# Patient Record
Sex: Female | Born: 1975 | Race: White | Hispanic: No | Marital: Married | Smoking: Never smoker
Health system: Southern US, Community
[De-identification: ages and names within clinical notes are randomized; demographics above are authoritative.]

## PROBLEM LIST (undated history)

## (undated) DIAGNOSIS — G43919 Migraine, unspecified, intractable, without status migrainosus: Secondary | ICD-10-CM

## (undated) DIAGNOSIS — R519 Headache, unspecified: Secondary | ICD-10-CM

## (undated) DIAGNOSIS — C50919 Malignant neoplasm of unspecified site of unspecified female breast: Secondary | ICD-10-CM

## (undated) DIAGNOSIS — F329 Major depressive disorder, single episode, unspecified: Secondary | ICD-10-CM

## (undated) DIAGNOSIS — R569 Unspecified convulsions: Secondary | ICD-10-CM

## (undated) DIAGNOSIS — G43909 Migraine, unspecified, not intractable, without status migrainosus: Secondary | ICD-10-CM

## (undated) DIAGNOSIS — R51 Headache: Secondary | ICD-10-CM

## (undated) DIAGNOSIS — F32A Depression, unspecified: Secondary | ICD-10-CM

## (undated) DIAGNOSIS — N912 Amenorrhea, unspecified: Secondary | ICD-10-CM

## (undated) DIAGNOSIS — F419 Anxiety disorder, unspecified: Secondary | ICD-10-CM

## (undated) HISTORY — DX: Headache, unspecified: R51.9

## (undated) HISTORY — DX: Migraine, unspecified, not intractable, without status migrainosus: G43.909

## (undated) HISTORY — DX: Migraine, unspecified, intractable, without status migrainosus: G43.919

## (undated) HISTORY — DX: Depression, unspecified: F32.A

## (undated) HISTORY — PX: LAPAROSCOPY: SHX197

## (undated) HISTORY — PX: TUBAL LIGATION: SHX77

## (undated) HISTORY — DX: Headache: R51

## (undated) HISTORY — DX: Major depressive disorder, single episode, unspecified: F32.9

## (undated) HISTORY — PX: OTHER SURGICAL HISTORY: SHX169

## (undated) HISTORY — DX: Anxiety disorder, unspecified: F41.9

## (undated) HISTORY — DX: Unspecified convulsions: R56.9

## (undated) HISTORY — DX: Amenorrhea, unspecified: N91.2

## (undated) HISTORY — PX: DILATION AND CURETTAGE OF UTERUS: SHX78

---

## 2001-11-15 ENCOUNTER — Other Ambulatory Visit: Admission: RE | Admit: 2001-11-15 | Discharge: 2001-11-15 | Payer: Self-pay | Admitting: Obstetrics and Gynecology

## 2001-11-18 ENCOUNTER — Encounter: Payer: Self-pay | Admitting: Obstetrics and Gynecology

## 2001-11-18 ENCOUNTER — Encounter: Admission: RE | Admit: 2001-11-18 | Discharge: 2001-11-18 | Payer: Self-pay | Admitting: Obstetrics and Gynecology

## 2002-01-04 ENCOUNTER — Encounter: Admission: RE | Admit: 2002-01-04 | Discharge: 2002-01-04 | Payer: Self-pay | Admitting: Obstetrics and Gynecology

## 2002-01-04 ENCOUNTER — Encounter: Payer: Self-pay | Admitting: Obstetrics and Gynecology

## 2002-03-29 ENCOUNTER — Encounter: Payer: Self-pay | Admitting: Obstetrics and Gynecology

## 2002-03-29 ENCOUNTER — Encounter: Admission: RE | Admit: 2002-03-29 | Discharge: 2002-03-29 | Payer: Self-pay | Admitting: Obstetrics and Gynecology

## 2002-11-24 ENCOUNTER — Other Ambulatory Visit: Admission: RE | Admit: 2002-11-24 | Discharge: 2002-11-24 | Payer: Self-pay | Admitting: Obstetrics and Gynecology

## 2003-08-19 ENCOUNTER — Emergency Department (HOSPITAL_COMMUNITY): Admission: EM | Admit: 2003-08-19 | Discharge: 2003-08-19 | Payer: Self-pay | Admitting: Emergency Medicine

## 2003-12-19 ENCOUNTER — Other Ambulatory Visit: Admission: RE | Admit: 2003-12-19 | Discharge: 2003-12-19 | Payer: Self-pay | Admitting: Obstetrics and Gynecology

## 2005-01-09 ENCOUNTER — Other Ambulatory Visit: Admission: RE | Admit: 2005-01-09 | Discharge: 2005-01-09 | Payer: Self-pay | Admitting: Obstetrics and Gynecology

## 2005-12-22 ENCOUNTER — Ambulatory Visit (HOSPITAL_COMMUNITY): Admission: RE | Admit: 2005-12-22 | Discharge: 2005-12-22 | Payer: Self-pay | Admitting: Obstetrics & Gynecology

## 2005-12-22 ENCOUNTER — Encounter (INDEPENDENT_AMBULATORY_CARE_PROVIDER_SITE_OTHER): Payer: Self-pay | Admitting: *Deleted

## 2010-10-11 NOTE — Op Note (Signed)
NAME:  Julie Bennett, Julie Bennett             ACCOUNT NO.:  [id number]   MEDICAL RECORD NO.:  [id number]          PATIENT TYPE:  AMB   LOCATION:  SDC                           FACILITY:  [facility]   PHYSICIAN:  Gerrit Friends. Aldona Bar, M.D.   DATE OF BIRTH:  1975-08-26   DATE OF PROCEDURE:  12/22/2005  DATE OF DISCHARGE:                                 OPERATIVE REPORT   The patient is age 35.   PREOPERATIVE DIAGNOSES:  First trimester pregnancy loss, pathology pending.  Blood type O+.   POSTOPERATIVE DIAGNOSES:  First trimester pregnancy loss, pathology pending.  Blood type O+.   PROCEDURE:  Suction dilatation and curettage for evacuation of first  trimester pregnancy loss.   SURGEON:  Gerrit Friends. Aldona Bar, M.D.   ANESTHESIA:  Intravenous conscious sedation and paracervical block with 1%  Xylocaine without epinephrine.   HISTORY:  This gravida 4, para 3 was seen in the office in early July and at  that time had an ultrasound consistent with her dates making her then  approximately 7 weeks and 5 days pregnant.  She returned to the office on  July 26 with an episode of sharp right lower quadrant pain and some nausea  and vomiting preceded a week earlier by an upper respiratory infection. When  she was examined in the office, her uterus felt at best to be 8 week size  inconsistent with her previous exam and ultrasound and indeed on ultrasound  a first trimester fetal demise at about [redacted] weeks gestation was diagnosed.  The patient had a CBC, differential and a comprehensive metabolic profile to  further evaluate her right lower quadrant discomfort for which no real  etiology could be determined.  She is now taken to the operating room for  evacuation of her first trimester pregnancy loss.  Her right lower quadrant  pain is now intermittent in nature, but as of yet she has had no bleeding.   OPERATIVE PROCEDURE:  The patient was taken to the operating room where  after the satisfactory induction of intravenous  conscious sedation, she was  prepped and draped having been placed in the modified lithotomy position.  A  speculum was placed after a prep was carried out and the bladder was drained  of clear urine with a rubber catheter in an in-and-out fashion.  Once the  cervix was adequately visualized, a paracervical block was carried out using  approximately 18 mL of 1% Xylocaine without epinephrine.  Thereafter the  internal os was systematically dilated to a #29 Pratt dilator and thereafter  using a #9 suction curette the cavity was thoroughly gently and  systematically evacuated of all products of conception.  Curettage with a  small standard curette was then carried out with no further production of  tissue and resuctioning likewise produced no further tissue.  The  examination at this time found the uterus was upper limits of normal size  and was firm and bleeding was minimal.  The procedure was felt to be  complete and the patient at this time was awakened and transported to the  recovery room in satisfactory  addition.  Pathologic specimen consistent with  products of conception.   The patient will be discharged from the recovery room appropriately and seen  in the office for follow-up in approximately two weeks' time.  Discharge  medications will include Naprosyn 500 mg every 6-8 hours as needed  for cramping and doxycycline 100 mg twice a day for a total of 5 days to  avoid any potential infection.  The patient will be given an instruction  sheet at the time of discharge from recovery. Condition on arrival to  recovery satisfactory.      Gerrit Friends. Aldona Bar, M.D.  Electronically Signed     RMW/MEDQ  D:  12/22/2005  T:  12/23/2005  Job:  [id number]

## 2013-04-14 ENCOUNTER — Emergency Department (HOSPITAL_COMMUNITY)
Admission: EM | Admit: 2013-04-14 | Discharge: 2013-04-14 | Disposition: A | Discharge status: Home / Self Care | Payer: No Typology Code available for payment source | Attending: Emergency Medicine | Admitting: Emergency Medicine

## 2013-04-14 ENCOUNTER — Encounter (HOSPITAL_COMMUNITY): Payer: Self-pay | Admitting: Emergency Medicine

## 2013-04-14 DIAGNOSIS — M549 Dorsalgia, unspecified: Secondary | ICD-10-CM

## 2013-04-14 DIAGNOSIS — Z79899 Other long term (current) drug therapy: Secondary | ICD-10-CM | POA: Insufficient documentation

## 2013-04-14 DIAGNOSIS — Y9389 Activity, other specified: Secondary | ICD-10-CM | POA: Insufficient documentation

## 2013-04-14 DIAGNOSIS — Y92410 Unspecified street and highway as the place of occurrence of the external cause: Secondary | ICD-10-CM | POA: Insufficient documentation

## 2013-04-14 DIAGNOSIS — IMO0002 Reserved for concepts with insufficient information to code with codable children: Secondary | ICD-10-CM | POA: Insufficient documentation

## 2013-04-14 MED ORDER — METHOCARBAMOL 500 MG PO TABS: 1000.0000 mg | ORAL_TABLET | Freq: Four times a day (QID) | ORAL | Status: DC

## 2013-04-14 MED ORDER — NAPROXEN 500 MG PO TABS: 500.0000 mg | ORAL_TABLET | Freq: Two times a day (BID) | ORAL | Status: DC
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
## 2013-04-14 NOTE — ED Provider Notes (Signed)
Medical screening examination/treatment/procedure(s) were conducted as a shared visit with non-physician practitioner(s) and myself.  I personally evaluated the patient during the encounter.  EKG Interpretation   None       I saw and evaluated patient, she is awake, alert, appears comfortable, moving all extremities.  No indication for imaging at this time.    Ethelda Chick, MD 04/14/13 1057
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
## 2013-08-21 ENCOUNTER — Encounter (HOSPITAL_COMMUNITY): Payer: Self-pay | Admitting: Emergency Medicine

## 2013-08-21 ENCOUNTER — Emergency Department (HOSPITAL_COMMUNITY)
Admission: EM | Admit: 2013-08-21 | Discharge: 2013-08-21 | Disposition: A | Discharge status: Home / Self Care | Payer: No Typology Code available for payment source | Attending: Emergency Medicine | Admitting: Emergency Medicine

## 2013-08-21 DIAGNOSIS — N390 Urinary tract infection, site not specified: Secondary | ICD-10-CM | POA: Insufficient documentation

## 2013-08-21 DIAGNOSIS — R1084 Generalized abdominal pain: Secondary | ICD-10-CM | POA: Insufficient documentation

## 2013-08-21 DIAGNOSIS — R319 Hematuria, unspecified: Secondary | ICD-10-CM | POA: Insufficient documentation

## 2013-08-21 DIAGNOSIS — K5900 Constipation, unspecified: Secondary | ICD-10-CM | POA: Insufficient documentation

## 2013-08-21 DIAGNOSIS — R300 Dysuria: Secondary | ICD-10-CM | POA: Insufficient documentation

## 2013-08-21 LAB — CBC WITH DIFFERENTIAL/PLATELET
BASOS PCT: 0 % (ref 0–1)
Basophils Absolute: 0 10*3/uL (ref 0.0–0.1)
EOS ABS: 0.1 10*3/uL (ref 0.0–0.7)
Eosinophils Relative: 1 % (ref 0–5)
HCT: 37 % (ref 36.0–46.0)
Hemoglobin: 12.9 g/dL (ref 12.0–15.0)
Lymphocytes Relative: 15 % (ref 12–46)
Lymphs Abs: 2.3 10*3/uL (ref 0.7–4.0)
MCH: 31.6 pg (ref 26.0–34.0)
MCHC: 34.9 g/dL (ref 30.0–36.0)
MCV: 90.7 fL (ref 78.0–100.0)
Monocytes Absolute: 0.9 10*3/uL (ref 0.1–1.0)
Monocytes Relative: 6 % (ref 3–12)
NEUTROS PCT: 79 % — AB (ref 43–77)
Neutro Abs: 12.4 10*3/uL — ABNORMAL HIGH (ref 1.7–7.7)
Platelets: 294 10*3/uL (ref 150–400)
RBC: 4.08 MIL/uL (ref 3.87–5.11)
RDW: 12.8 % (ref 11.5–15.5)
WBC: 15.7 10*3/uL — ABNORMAL HIGH (ref 4.0–10.5)

## 2013-08-21 LAB — WET PREP, GENITAL
Clue Cells Wet Prep HPF POC: NONE SEEN
TRICH WET PREP: NONE SEEN
WBC WET PREP: NONE SEEN
Yeast Wet Prep HPF POC: NONE SEEN

## 2013-08-21 LAB — COMPREHENSIVE METABOLIC PANEL
ALK PHOS: 90 U/L (ref 39–117)
ALT: 11 U/L (ref 0–35)
AST: 18 U/L (ref 0–37)
Albumin: 3.9 g/dL (ref 3.5–5.2)
BUN: 10 mg/dL (ref 6–23)
CO2: 26 meq/L (ref 19–32)
Calcium: 9.2 mg/dL (ref 8.4–10.5)
Chloride: 104 mEq/L (ref 96–112)
Creatinine, Ser: 0.8 mg/dL (ref 0.50–1.10)
GFR calc Af Amer: >90 mL/min (ref >90)
GLUCOSE: 79 mg/dL (ref 70–99)
POTASSIUM: 4.3 meq/L (ref 3.7–5.3)
SODIUM: 142 meq/L (ref 137–147)
Total Bilirubin: 0.2 mg/dL — ABNORMAL LOW (ref 0.3–1.2)
Total Protein: 7.2 g/dL (ref 6.0–8.3)

## 2013-08-21 LAB — URINALYSIS, ROUTINE W REFLEX MICROSCOPIC
BILIRUBIN URINE: NEGATIVE
GLUCOSE, UA: NEGATIVE mg/dL
Ketones, ur: 15 mg/dL — AB
Nitrite: NEGATIVE
PH: 5.5 (ref 5.0–8.0)
Protein, ur: 30 mg/dL — AB
SPECIFIC GRAVITY, URINE: 1.01 (ref 1.005–1.030)
Urobilinogen, UA: 1 mg/dL (ref 0.0–1.0)

## 2013-08-21 LAB — URINE MICROSCOPIC-ADD ON

## 2013-08-21 LAB — POC URINE PREG, ED: PREG TEST UR: NEGATIVE

## 2013-08-21 MED ORDER — PHENAZOPYRIDINE HCL 200 MG PO TABS: 200.0000 mg | ORAL_TABLET | Freq: Three times a day (TID) | ORAL | Status: DC

## 2013-08-21 MED ORDER — SULFAMETHOXAZOLE-TRIMETHOPRIM 800-160 MG PO TABS: 1.0000 | ORAL_TABLET | Freq: Two times a day (BID) | ORAL | Status: AC

## 2013-08-21 MED ORDER — KETOROLAC TROMETHAMINE 60 MG/2ML IM SOLN
60.0000 mg | Freq: Once | INTRAMUSCULAR | Status: AC
  Administered 2013-08-21: 60 mg via INTRAMUSCULAR
  Filled 2013-08-21: qty 2

## 2013-08-21 NOTE — ED Provider Notes (Signed)
I saw and evaluated the patient, reviewed the resident's note and I agree with the findings and plan.   .Face to face Exam:  General:  Awake HEENT:  Atraumatic Resp:  Normal effort Abd:  Nondistended Neuro:No focal weakness  Nelia Shiobert L Loistine Eberlin, MD 08/21/13 1819

## 2013-08-21 NOTE — ED Provider Notes (Signed)
CSN: [id number]     Arrival date & time 08/21/13  1526 History   First MD Initiated Contact with Patient 08/21/13 1604     Chief Complaint  Patient presents with  . Abdominal Pain  . Hematuria     (Consider location/radiation/quality/duration/timing/severity/associated sxs/prior Treatment) Patient is a 38 y.o. female presenting with abdominal pain. The history is provided by the patient.  Abdominal Pain Pain location:  Suprapubic Pain quality: cramping   Pain radiates to:  Does not radiate Pain severity:  Mild Onset quality:  Gradual Duration:  1 day Timing:  Constant Progression:  Unchanged Chronicity:  New Context: not alcohol use, not eating, not previous surgeries, not recent illness and not trauma   Relieved by:  Nothing Worsened by:  Urination Ineffective treatments:  None tried Associated symptoms: constipation, dysuria and hematuria   Associated symptoms: no anorexia, no chest pain, no cough, no diarrhea, no hematemesis, no hematochezia, no nausea, no shortness of breath and no vomiting   Risk factors: no alcohol abuse, not elderly, has not had multiple surgeries and not obese     History reviewed. No pertinent past medical history. Past Surgical History  Procedure Laterality Date  . Tubal ligation     History reviewed. No pertinent family history. History  Substance Use Topics  . Smoking status: Not on file  . Smokeless tobacco: Not on file  . Alcohol Use: No   OB History   Grav Para Term Preterm Abortions TAB SAB Ect Mult Living                 Review of Systems  Constitutional: Negative for activity change and appetite change.  HENT: Negative for congestion and rhinorrhea.   Eyes: Negative for discharge, redness and itching.  Respiratory: Negative for cough, shortness of breath, wheezing and stridor.   Cardiovascular: Negative for chest pain.  Gastrointestinal: Positive for abdominal pain and constipation. Negative for nausea, vomiting, diarrhea,  hematochezia, anorexia and hematemesis.  Genitourinary: Positive for dysuria and hematuria. Negative for decreased urine volume.  Skin: Negative for rash and wound.  Neurological: Negative for syncope.      Allergies  Review of patient's allergies indicates no known allergies.  Home Medications   Current Outpatient Rx  Name  Route  Sig  Dispense  Refill  . phenazopyridine (PYRIDIUM) 200 MG tablet   Oral   Take 1 tablet (200 mg total) by mouth 3 (three) times daily.   6 tablet   0   . sulfamethoxazole-trimethoprim (SEPTRA DS) 800-160 MG per tablet   Oral   Take 1 tablet by mouth 2 (two) times daily.   14 tablet   0    BP 129/73  Temp(Src) 97.8 F (36.6 C) (Oral)  Resp 16  Ht 5\' 5"  (1.651 m)  Wt 140 lb (63.504 kg)  BMI 23.30 kg/m2  SpO2 100%  LMP 08/14/2013 Physical Exam  Constitutional: She is oriented to person, place, and time. She appears well-developed and well-nourished. No distress.  HENT:  Head: Normocephalic and atraumatic.  Mouth/Throat: Oropharynx is clear and moist.  Eyes: Conjunctivae and EOM are normal. Pupils are equal, round, and reactive to light. Right eye exhibits no discharge. Left eye exhibits no discharge. No scleral icterus.  Neck: Normal range of motion. Neck supple.  Cardiovascular: Normal rate, regular rhythm and intact distal pulses.  Exam reveals no gallop and no friction rub.   No murmur heard. Pulmonary/Chest: Effort normal and breath sounds normal. No respiratory distress. She has no wheezes.  She has no rales.  Abdominal: Soft. She exhibits no distension and no mass. There is tenderness (suprapubic ttp, no RLQ, LLQ , RUQ, LUQ, or CVA TTP, no rigidity, no peritoneal signs).  Genitourinary: Vagina normal and uterus normal.  No gross blood, physiological discharge, no CMT, no adnexal ttp or fullness  Musculoskeletal: Normal range of motion.  Neurological: She is alert and oriented to person, place, and time. No cranial nerve deficit. She  exhibits normal muscle tone. Coordination normal.  Skin: She is not diaphoretic.    ED Course  Procedures (including critical care time) Labs Review Labs Reviewed  URINALYSIS, ROUTINE W REFLEX MICROSCOPIC - Abnormal; Notable for the following:    Color, Urine RED (*)    APPearance TURBID (*)    Hgb urine dipstick LARGE (*)    Ketones, ur 15 (*)    Protein, ur 30 (*)    Leukocytes, UA MODERATE (*)    All other components within normal limits  COMPREHENSIVE METABOLIC PANEL - Abnormal; Notable for the following:    Total Bilirubin 0.2 (*)    All other components within normal limits  CBC WITH DIFFERENTIAL - Abnormal; Notable for the following:    WBC 15.7 (*)    Neutrophils Relative % 79 (*)    Neutro Abs 12.4 (*)    All other components within normal limits  URINE MICROSCOPIC-ADD ON - Abnormal; Notable for the following:    Bacteria, UA FEW (*)    All other components within normal limits  WET PREP, GENITAL  GC/CHLAMYDIA PROBE AMP  URINE CULTURE  POC URINE PREG, ED   Imaging Review No results found.   EKG Interpretation None      MDM   MDM: 38 y.o. WF w/ no PMHX w. Cc: of suprapubic abd pain x 1 day. Started today, states that she has been urinating some gross blood. Pain is all anterior suprapubic, having dysuria as well. No f/c, n/v/d, chest pain, SOB. No hx of prior. States pain feels crampy. AFVSS, well appearing here, playing on ipad. Mild anterior suprapubic abd ttp, no rigidity or periotnitis. No RLQ, neg mcburneys, neg obturator, neg psoas, no CVA ttp. GU exam with no blood, no CMT, tolerated well. Labs with leukocytosis, urine with blood, leuks, bacteria. Likely has cystitis. No CVA ttp, no flank ttp to suggest pyelo or stone. No signs of appy as neg mcburneys, afebrile, presence of dysuria, reassuring abd exam. Given toradol here, will treat with BActrim and Pyridium. Pt with no PCP states "she doesn't need to see a doctor" recommend follow up with ED if sh edevelops  vomiting, fever, change in pain severity or location, or in 3 days if pain not resolved or greatly improved. Discharged. Care of case d/w my attending.  Final diagnoses:  Urinary tract infection    Discharged  Pilar Jarvis, MD 08/21/13 1812  Pilar Jarvis, MD 08/21/13 [phone]

## 2013-08-21 NOTE — ED Notes (Signed)
Pt reports lower abd pain x 2 hours. Reports frequent urination and bowel movements then started having vaginal bleeding and blood in urine, passing blood clots. No acute distress noted at triage.

## 2013-08-21 NOTE — ED Notes (Signed)
Pt discharged home. Has no further questions. Vital signs stable. No signs of distress noted.

## 2013-08-21 NOTE — Discharge Instructions (Signed)
Urinary Tract Infection  Urinary tract infections (UTIs) can develop anywhere along your urinary tract. Your urinary tract is your body's drainage system for removing wastes and extra water. Your urinary tract includes two kidneys, two ureters, a bladder, and a urethra. Your kidneys are a pair of bean-shaped organs. Each kidney is about the size of your fist. They are located below your ribs, one on each side of your spine.  CAUSES  Infections are caused by microbes, which are microscopic organisms, including fungi, viruses, and bacteria. These organisms are so small that they can only be seen through a microscope. Bacteria are the microbes that most commonly cause UTIs.  SYMPTOMS   Symptoms of UTIs may vary by age and gender of the patient and by the location of the infection. Symptoms in young women typically include a frequent and intense urge to urinate and a painful, burning feeling in the bladder or urethra during urination. Older women and men are more likely to be tired, shaky, and weak and have muscle aches and abdominal pain. A fever may mean the infection is in your kidneys. Other symptoms of a kidney infection include pain in your back or sides below the ribs, nausea, and vomiting.  DIAGNOSIS  To diagnose a UTI, your caregiver will ask you about your symptoms. Your caregiver also will ask to provide a urine sample. The urine sample will be tested for bacteria and white blood cells. White blood cells are made by your body to help fight infection.  TREATMENT   Typically, UTIs can be treated with medication. Because most UTIs are caused by a bacterial infection, they usually can be treated with the use of antibiotics. The choice of antibiotic and length of treatment depend on your symptoms and the type of bacteria causing your infection.  HOME CARE INSTRUCTIONS   If you were prescribed antibiotics, take them exactly as your caregiver instructs you. Finish the medication even if you feel better after you  have only taken some of the medication.   Drink enough water and fluids to keep your urine clear or pale yellow.   Avoid caffeine, tea, and carbonated beverages. They tend to irritate your bladder.   Empty your bladder often. Avoid holding urine for long periods of time.   Empty your bladder before and after sexual intercourse.   After a bowel movement, women should cleanse from front to back. Use each tissue only once.  SEEK MEDICAL CARE IF:    You have back pain.   You develop a fever.   Your symptoms do not begin to resolve within 3 days.  SEEK IMMEDIATE MEDICAL CARE IF:    You have severe back pain or lower abdominal pain.   You develop chills.   You have nausea or vomiting.   You have continued burning or discomfort with urination.  MAKE SURE YOU:    Understand these instructions.   Will watch your condition.   Will get help right away if you are not doing well or get worse.  Document Released: 02/19/2005 Document Revised: 11/11/2011 Document Reviewed: 06/20/2011  [employer] Patient Information 2014 [employer]

## 2013-08-22 LAB — GC/CHLAMYDIA PROBE AMP
CT Probe RNA: NEGATIVE
GC Probe RNA: NEGATIVE

## 2013-08-24 LAB — URINE CULTURE: Colony Count: >=100000

## 2013-08-25 NOTE — Progress Notes (Signed)
ED Antimicrobial Stewardship Positive Culture Follow Up   Julie Bennett is an 38 y.o. female who [facility] with a chief complaint of  Chief Complaint  Patient presents with  . Abdominal Pain  . Hematuria    Recent Results (from the past 720 hour(s))  URINE CULTURE     Status: None   Collection Time    08/21/13  3:44 PM      Result Value Ref Range Status   Specimen Description URINE, CLEAN CATCH   Final   Special Requests NONE   Final   Culture  Setup Time     Final   Value: 08/22/2013 10:04     Performed at [facility]   Colony Count     Final   Value: >=100,000 COLONIES/ML     Performed at [facility]   Culture     Final   Value: ESCHERICHIA COLI     Performed at [facility]   Report Status 08/24/2013 FINAL   Final   Organism ID, Bacteria ESCHERICHIA COLI   Final  GC/CHLAMYDIA PROBE AMP     Status: None   Collection Time    08/21/13  5:17 PM      Result Value Ref Range Status   CT Probe RNA NEGATIVE  NEGATIVE Final   GC Probe RNA NEGATIVE  NEGATIVE Final   Comment: (NOTE)                                                                                               **Normal Reference Range: Negative**          Assay performed using the Gen-Probe APTIMA COMBO2 (R) Assay.     Acceptable specimen types for this assay include APTIMA Swabs (Unisex,     endocervical, urethral, or vaginal), first void urine, and ThinPrep     liquid based cytology samples.     Performed at [facility]  WET PREP, GENITAL     Status: None   Collection Time    08/21/13  5:17 PM      Result Value Ref Range Status   Yeast Wet Prep HPF POC NONE SEEN  NONE SEEN Final   Trich, Wet Prep NONE SEEN  NONE SEEN Final   Clue Cells Wet Prep HPF POC NONE SEEN  NONE SEEN Final   WBC, Wet Prep HPF POC NONE SEEN  NONE SEEN Final    [x]  Treated with Septra, organism resistant to prescribed antimicrobial Pt came in with abd pain and hematuria.  Sent out on septra but e.coli is resistant to it. Will use Keflex instead.   New antibiotic prescription:   Dc Septra Start Keflex 500mg  PO x 7 days  ED Provider: Trixie DredgeEmily West, PA   Ulyses SouthwardMinh Pham, PharmD Pager: [phone] Infectious Diseases Pharmacist Phone# [phone]

## 2013-08-25 NOTE — ED Notes (Signed)
+   urine D/C Septra Start Keflex 500 mg po TID x 7 days needs to be called to pharmacy.

## 2013-08-26 ENCOUNTER — Telehealth (HOSPITAL_BASED_OUTPATIENT_CLINIC_OR_DEPARTMENT_OTHER): Payer: Self-pay | Admitting: *Deleted

## 2014-03-18 DIAGNOSIS — N83209 Unspecified ovarian cyst, unspecified side: Secondary | ICD-10-CM | POA: Insufficient documentation

## 2016-05-21 DIAGNOSIS — N912 Amenorrhea, unspecified: Secondary | ICD-10-CM | POA: Insufficient documentation

## 2016-05-21 DIAGNOSIS — B9689 Other specified bacterial agents as the cause of diseases classified elsewhere: Secondary | ICD-10-CM | POA: Insufficient documentation

## 2016-05-23 DIAGNOSIS — E28319 Asymptomatic premature menopause: Secondary | ICD-10-CM | POA: Insufficient documentation

## 2016-08-20 ENCOUNTER — Encounter: Payer: Self-pay | Admitting: Neurology

## 2016-08-20 ENCOUNTER — Telehealth: Payer: Self-pay

## 2016-08-20 ENCOUNTER — Encounter (INDEPENDENT_AMBULATORY_CARE_PROVIDER_SITE_OTHER): Payer: Self-pay

## 2016-08-20 ENCOUNTER — Other Ambulatory Visit: Payer: Self-pay | Admitting: Neurology

## 2016-08-20 ENCOUNTER — Ambulatory Visit (INDEPENDENT_AMBULATORY_CARE_PROVIDER_SITE_OTHER): Payer: Medicaid Other | Admitting: Neurology

## 2016-08-20 DIAGNOSIS — G43919 Migraine, unspecified, intractable, without status migrainosus: Secondary | ICD-10-CM | POA: Diagnosis not present

## 2016-08-20 DIAGNOSIS — G43109 Migraine with aura, not intractable, without status migrainosus: Secondary | ICD-10-CM

## 2016-08-20 HISTORY — DX: Migraine, unspecified, intractable, without status migrainosus: G43.919

## 2016-08-20 MED ORDER — RIZATRIPTAN BENZOATE 10 MG PO TBDP: 10.0000 mg | ORAL_TABLET | ORAL | 1 refills | Status: DC | PRN

## 2016-08-20 MED ORDER — TOPIRAMATE 25 MG PO TABS: 25.0000 mg | ORAL_TABLET | Freq: Two times a day (BID) | ORAL | 3 refills | Status: DC

## 2016-08-20 MED ORDER — TOPIRAMATE ER 50 MG PO CAP24: 1.0000 | ORAL_CAPSULE | Freq: Every day | ORAL | 3 refills | Status: DC

## 2016-08-20 NOTE — Progress Notes (Signed)
[facility] [address] [place]. [place] [address] [phone]       OFFICE CONSULT NOTE  Julie. Lanney Gins Date of Birth:  [birth year] Medical Record Number:  [id number]   Referring MD:  Virl Son Reason for Referral:  headaches  HPI: Julie Bennett is a pleasant 41 year Caucasian lady who has had migraine headaches since age 41. She's noticed increasing frequency and severity of her headaches since June 2017. There is happened after she moved back to [place] from [place]. She describes the headache as stated typical starting in the temporalis it may be pressure-like at the onset but soon becomes throbbing and unbearable. Headaches often occur been bad nausea but only occasional vomiting. She does have light and sound sensitivity and feels better if she lies down and goes to sleep. Physical activity like bending or walking around makes it worse. She used to take Motrin usually with good relief and uses Maxalt for severe headaches. However she is concerned a month ago when she had sudden onset of severe headache which she did not get any benefit from Motrin and even after trying Maxalt the headache persisted and lasted several hours and eventually she had to go to sleep. She did have some blurred vision and eye pain with that headache as well and she is concerned as to whether this represented a new problem. She has not had any brain imaging study done: This headache. She identifies strong perfumes hours as smell as possible triggers for headache. She denies visual symptoms and onset of focal neurological symptoms accompanying her headaches. She has never had a brain MRI scan. She did have an MRI scan of cervical spine several years ago causing a fall and was found to have mild disc degenerative change. She does have family history of migraine in a sister. 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 She does admit to drinking 2 bottles of soda per day and states she will try to cut back. She does not drink excessive amounts of tea or coffee. She has never been on any migraine prophylactic medications or tried medications besides Maxalt for symptomatic relief. She has never seen  a neurologist. She has recently seen a gynecologist and been started on hormone replacement as she is perimenopausal and her FSH was quite abnormal  ROS:   14 system review of systems is positive for  headache, eye pain and all the systems negative PMH:  Past Medical History:  Diagnosis Date  . Amenorrhea   . Anxiety   . Depression   . Headache     Social History:  Social History   Social History  . Marital status: Married    Spouse name: N/A  . Number of children: N/A  . Years of education: N/A   Occupational History  . Not on file.   Social History Main Topics  . Smoking status: Never Smoker  . Smokeless tobacco: Never Used  . Alcohol use No  . Drug use: No  . Sexual activity: Yes    Birth control/ protection: None   Other Topics Concern  . Not on file   Social History Narrative  . No narrative on file    Medications:   No current outpatient prescriptions on file prior to visit.   No current facility-administered medications on file prior to visit.     Allergies:  No Known Allergies  Physical Exam General: well  developed, well nourished middle-age Caucasian lady, seated, in no evident distress Head: head normocephalic and atraumatic.   Neck: supple with no carotid or supraclavicular bruits Cardiovascular: regular rate and rhythm, no murmurs Musculoskeletal: no deformity Skin:  no rash/petichiae Vascular:  Normal pulses all extremities  Neurologic Exam Mental Status: Awake and fully alert. Oriented to place and time. Recent and remote memory intact. Attention span, concentration and fund of knowledge appropriate. Mood and affect appropriate.  Cranial Nerves:  Fundoscopic exam reveals sharp disc margins. Pupils equal, briskly reactive to light. Extraocular movements full without nystagmus. Visual fields full to confrontation. Hearing intact. Facial sensation intact. Face, tongue, palate moves normally and symmetrically.  Motor: Normal bulk and tone. Normal strength in all tested extremity muscles. Sensory.: intact to touch , pinprick , position and vibratory sensation.  Coordination: Rapid alternating movements normal in all extremities. Finger-to-nose and heel-to-shin performed accurately bilaterally. Gait and Station: Arises from chair without difficulty. Stance is normal. Gait demonstrates normal stride length and balance . Able to heel, toe and tandem walk without difficulty.  Reflexes: 1+ and symmetric. Toes downgoing.       ASSESSMENT: 41 year Caucasian lady with the increase in frequency of migraine headaches with refractoriness to treatment likely related to hormonal changes in her perimenopausal state.    PLAN: I had a long discussion with the patient with regards to her refractory migraine headaches in recent months which are likely related to the hormonal changes she is going through due to her perimenopausal state. I recommend she discuss with her gynecologist to change her to the lowest estrogen-containing pill. I also advised her to cut back intake of soda that she drinks and to keep a headache diary. Continue Maxalt 10 mg for symptomatic relief of her headaches and start Trokendi XR 50 mg for migraine prophylaxis. I have discussed possible side effects with the patient and advised her to call me as needed. Check MRI scan of the brain and MRA of the brain to look for structural or vascular lesions. Greater than 50% time during this 45 minute consultation visit was spent on counseling and coordination of care about her refractory migraines, discussion of treatment options and answering questions. She will return for follow-up in 2 months or  call earlier if necessary. Delia HeadyPramod Quandre Polinski, MD  [facility] [address] [place], [place] [id number]  Phone [phone] Fax [phone] Note: This document was prepared with digital dictation and possible smart phrase technology. Any transcriptional errors that result from this process are unintentional.

## 2016-08-20 NOTE — Patient Instructions (Signed)
I had a long discussion with the patient with regards to her refractory migraine headaches in recent months which are likely related to the hormonal changes she is going through due to her perimenopausal state. I recommend she discuss with her gynecologist to change her to the lowest estrogen-containing pill. I also advised her to cut back intake of soda that she drinks and to keep a headache diary. Continue Maxalt 10 mg for symptomatic relief of her headaches and start Trokendi XR 50 mg for migraine prophylaxis. I have discussed possible side effects with the patient and advised her to call me as needed. Check MRI scan of the brain and MRA of the brain to look for structural or vascular lesions. She will return for follow-up in 2 months or call earlier if necessary.   Migraine Headache A migraine headache is a very strong throbbing pain on one side or both sides of your head. Migraines can also cause other symptoms. Talk with your doctor about what things may bring on (trigger) your migraine headaches. Follow these instructions at home: Medicines   Take over-the-counter and prescription medicines only as told by your doctor.  Do not drive or use heavy machinery while taking prescription pain medicine.  To prevent or treat constipation while you are taking prescription pain medicine, your doctor may recommend that you:  Drink enough fluid to keep your pee (urine) clear or pale yellow.  Take over-the-counter or prescription medicines.  Eat foods that are high in fiber. These include fresh fruits and vegetables, whole grains, and beans.  Limit foods that are high in fat and processed sugars. These include fried and sweet foods. Lifestyle   Avoid alcohol.  Do not use any products that contain nicotine or tobacco, such as cigarettes and e-cigarettes. If you need help quitting, ask your doctor.  Get at least 8 hours of sleep every night.  Limit your stress. General instructions    Keep a  journal to find out what may bring on your migraines. For example, write down:  What you eat and drink.  How much sleep you get.  Any change in what you eat or drink.  Any change in your medicines.  If you have a migraine:  Avoid things that make your symptoms worse, such as bright lights.  It may help to lie down in a dark, quiet room.  Do not drive or use heavy machinery.  Ask your doctor what activities are safe for you.  Keep all follow-up visits as told by your doctor. This is important. Contact a doctor if:  You get a migraine that is different or worse than your usual migraines. Get help right away if:  Your migraine gets very bad.  You have a fever.  You have a stiff neck.  You have trouble seeing.  Your muscles feel weak or like you cannot control them.  You start to lose your balance a lot.  You start to have trouble walking.  You pass out (faint). This information is not intended to replace advice given to you by your health care provider. Make sure you discuss any questions you have with your health care provider. Document Released: 02/19/2008 Document Revised: 11/30/2015 Document Reviewed: 10/29/2015 Elsevier Interactive Patient Education  2017 [employer]

## 2016-08-20 NOTE — Telephone Encounter (Signed)
Left vm for patient to call back about medication being change to topamax. Rn will call patient on Thursday.

## 2016-08-20 NOTE — Telephone Encounter (Signed)
RN call [place] [facility] at [phone]. Rn was told by Hermelinda MedicusAAIda in customer service that medication will be denied. The reference number [id number]. Pt has to fail two of these preferred medications. They are: Diastat, klonopin, zonisomide, Topamax,and topamax.  She has to fail two of these medications before getting approve for trokdendi XR. Rn notified Dr. Pearlean BrownieSethi and he prescribed topamax. Rn will call patient tomorrow.

## 2016-08-21 NOTE — Telephone Encounter (Signed)
Rn call patient that the trokendi that was prescribed was denied by her insurance. Rn stated Dr. Pearlean BrownieSethi change her to topamax generic. Rn stated she is to take one pill twice a day. Pt verbalized understanding.

## 2016-09-01 ENCOUNTER — Other Ambulatory Visit: Payer: Self-pay | Admitting: Neurology

## 2016-09-11 ENCOUNTER — Ambulatory Visit
Admission: RE | Admit: 2016-09-11 | Discharge: 2016-09-11 | Disposition: A | Discharge status: Home / Self Care | Payer: Medicaid Other | Source: Ambulatory Visit | Attending: Neurology | Admitting: Neurology

## 2016-09-11 DIAGNOSIS — G43919 Migraine, unspecified, intractable, without status migrainosus: Secondary | ICD-10-CM

## 2016-09-17 ENCOUNTER — Telehealth: Payer: Self-pay

## 2016-09-17 NOTE — Telephone Encounter (Signed)
Rn receive call from patient with phone staff transferring. Rn stated the MRI shows minimal changes of hardening of arteries. Also no new worrisome finding. MRA of brain shows no blockages of blood  Vessels in brain. Pt verbalized understanding.

## 2016-09-17 NOTE — Telephone Encounter (Signed)
-----   Message from Micki Riley, MD sent at 09/13/2016 12:24 PM EDT ----- Joneen Roach inform patient that mri brain shows minimal changes of hardening of arteies . Not a worrisome finding. MRA of brain shows no significant blockages of blood vessels in brain

## 2016-09-17 NOTE — Telephone Encounter (Signed)
Left vm for patient to call back about MRI brain results. ------ 

## 2016-11-25 ENCOUNTER — Ambulatory Visit (INDEPENDENT_AMBULATORY_CARE_PROVIDER_SITE_OTHER): Payer: Medicaid Other | Admitting: Neurology

## 2016-11-25 ENCOUNTER — Encounter (INDEPENDENT_AMBULATORY_CARE_PROVIDER_SITE_OTHER): Payer: Self-pay

## 2016-11-25 ENCOUNTER — Encounter: Payer: Self-pay | Admitting: Neurology

## 2016-11-25 VITALS — BP 99/68 | HR 72 | Wt 143.0 lb

## 2016-11-25 DIAGNOSIS — G43009 Migraine without aura, not intractable, without status migrainosus: Secondary | ICD-10-CM

## 2016-11-25 NOTE — Patient Instructions (Signed)
I had a long discussion with the patient regarding her migraine headaches which appear to be doing significantly better on Topamax. She however is having some persistent perioral and fingertip paresthesias. I expect this to improve within next few months but if they persist or get bothersome may consider reducing the Topamax dose. Continue Maxalt for symptomatic relief and Topamax for headache prophylaxis. She was advised to avoid headache triggers. She will return for follow-up in 3 months with my nurse practitioner or call earlier if necessary.

## 2016-11-25 NOTE — Progress Notes (Addendum)
[facility] [address] [place]. [place] [address] [phone]       OFFICE FOLLOW UP VISIT  NOTE  Julie. Julie Bennett Date of Birth:  06-Jan-1976 Medical Record Number:  [id number]   Referring MD:  Virl Son Reason for Referral:  headaches  HPI:  Initial consultation 08/20/16 Julie Bennett is a pleasant 41 year Caucasian lady who has had migraine headaches since age 41. She's noticed increasing frequency and severity of her headaches since June 2017. There is happened after she moved back to [place] from [place]. She describes the headache as stated typical starting in the temporalis it may be pressure-like at the onset but soon becomes throbbing and unbearable. Headaches often occur been bad nausea but only occasional vomiting. She does have light and sound sensitivity and feels better if she lies down and goes to sleep. Physical activity like bending or walking around makes it worse. She used to take Motrin usually with good relief and uses Maxalt for severe headaches. However she is concerned a month ago when she had sudden onset of severe headache which she did not get any benefit from Motrin and even after trying Maxalt the headache persisted and lasted several hours and eventually she had to go to sleep. She did have some blurred vision and eye pain with that headache as well and she is concerned as to whether this represented a new problem. She has not had any brain imaging study done: This headache. She identifies strong perfumes hours as smell as possible triggers for headache. She denies visual symptoms and onset of focal neurological symptoms accompanying her headaches. She has never had a brain MRI scan. She did have an MRI scan of cervical spine several years ago causing a fall and was found to have mild disc degenerative change. She does have family history of migraine in a sister. 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 She does admit to drinking 2 bottles of soda per day and states she will try to cut back. She does not drink excessive amounts of tea or coffee. She has never been on any migraine prophylactic medications or tried medications besides Maxalt for symptomatic relief. She has never seen  a neurologist. She has recently seen a gynecologist and been started on hormone replacement as she is perimenopausal and her FSH was quite abnormal  Update 11/25/2016 ; She returns for follow-up after initial consultation 2 months ago. She states she's noticed significant improvement in her headache after starting Topamax. Her insurance company refused to pay for brand name did talk in the axilla and hence she is taking generic Topamax twice daily. She is complaining of numbness around the lips as well as fingertips which has persisted. She has lost her taste for drinking soda and in fact has lost about 10-15 pounds. She gets headaches from daily once or twice a month and given 10 Tylenol works. She has not taken Maxalt in the last couple of months. She spoke to a gynecologist who told her that she was already on the lowest estrogen dose of hormone and he did not change it. Patient underwent MRI scan the brain on 09/12/16 which was fairly unremarkable except for nonspecific tiny solitary left subcortical white matter hyperintensity of unclear significance. MRA of the brain shows no aneurysms or large vessel stenosis. I personally reviewed imaging films and discuss findings with the patient and answered her questions.  ROS:   14 system review of systems is positive for  headache, tingling, numbness and all the systems negative PMH:  Past Medical History:  Diagnosis Date  . Amenorrhea   . Anxiety   . Depression   . Headache     Social History:  Social History   Social History  . Marital status: Married    Spouse name: N/A  . Number of children: N/A  . Years of education: N/A    Occupational History  . Not on file.   Social History Main Topics  . Smoking status: Never Smoker  . Smokeless tobacco: Never Used  . Alcohol use No  . Drug use: No  . Sexual activity: Yes    Birth control/ protection: None   Other Topics Concern  . Not on file   Social History Narrative  . No narrative on file    Medications:   Current Outpatient Prescriptions on File Prior to Visit  Medication Sig Dispense Refill  . estradiol (ESTRACE) 0.5 MG tablet Take 0.5 mg by mouth.    . progesterone (PROMETRIUM) 100 MG capsule Take 100 mg by mouth.    . rizatriptan (MAXALT-MLT) 10 MG disintegrating tablet Take 1 tablet (10 mg total) by mouth as needed for migraine. 10 tablet 1  . topiramate (TOPAMAX) 25 MG tablet Take 1 tablet (25 mg total) by mouth 2 (two) times daily. 120 tablet 3   No current facility-administered medications on file prior to visit.     Allergies:  No Known Allergies  Physical Exam General: well developed, well nourished middle-age Caucasian lady, seated, in no evident distress Head: head normocephalic and atraumatic.   Neck: supple with no carotid or supraclavicular bruits Cardiovascular: regular rate and rhythm, no murmurs Musculoskeletal: no deformity Skin:  no rash/petichiae Vascular:  Normal pulses all extremities  Neurologic Exam Mental Status: Awake and fully alert. Oriented to place and time. Recent and remote memory intact. Attention span, concentration and fund of knowledge appropriate. Mood and affect appropriate.  Cranial Nerves: Fundoscopic exam not done. Pupils equal, briskly reactive to light. Extraocular movements full without nystagmus. Visual fields full to confrontation. Hearing intact. Facial sensation intact. Face, tongue, palate moves normally and symmetrically.  Motor: Normal bulk and tone. Normal strength in all tested extremity muscles. Sensory.: intact to touch , pinprick , position and vibratory sensation.  Coordination: Rapid  alternating movements normal in all extremities. Finger-to-nose and heel-to-shin performed accurately bilaterally. Gait and Station: Arises from chair without difficulty. Stance is normal. Gait demonstrates normal stride length and balance . Able to heel, toe and tandem walk without difficulty.  Reflexes: 1+ and symmetric. Toes downgoing.       ASSESSMENT: [age] year Caucasian lady with the increase in frequency of migraine headaches which appear well controlled on Topamax.    PLAN: I had a long discussion with the patient regarding her migraine headaches which appear to be doing significantly better on Topamax. She however is having some persistent perioral and fingertip paresthesias. I expect this to improve within next few months but if they persist or get bothersome may consider reducing the Topamax dose. Continue Maxalt for symptomatic relief and Topamax for headache prophylaxis. She was advised to avoid headache triggers. She will return for follow-up in 3 months with my nurse practitioner or call earlier if necessary. Greater than 50% time during this 25 minute   visit was spent on counseling and coordination of care about her refractory migraines, discussion of treatment options and answering questions.  [place]  [id number] [facility] [address] [place], [place] [address]  Phone [phone] Fax [phone] Note: This document was prepared with digital dictation and possible smart phrase technology. Any transcriptional errors that result from this process are unintentional.

## 2016-12-22 ENCOUNTER — Other Ambulatory Visit: Payer: Self-pay | Admitting: Neurology

## 2016-12-22 DIAGNOSIS — G43109 Migraine with aura, not intractable, without status migrainosus: Secondary | ICD-10-CM

## 2017-02-25 ENCOUNTER — Ambulatory Visit: Payer: Medicaid Other | Admitting: Nurse Practitioner

## 2017-09-02 ENCOUNTER — Other Ambulatory Visit: Payer: Self-pay | Admitting: Neurology

## 2017-09-02 DIAGNOSIS — G43919 Migraine, unspecified, intractable, without status migrainosus: Secondary | ICD-10-CM

## 2017-12-16 ENCOUNTER — Other Ambulatory Visit: Payer: Self-pay

## 2017-12-16 ENCOUNTER — Encounter (HOSPITAL_COMMUNITY): Payer: Self-pay | Admitting: Emergency Medicine

## 2017-12-16 ENCOUNTER — Emergency Department (HOSPITAL_COMMUNITY)
Admission: EM | Admit: 2017-12-16 | Discharge: 2017-12-17 | Disposition: A | Discharge status: Home / Self Care | Payer: BC Managed Care – PPO | Attending: Emergency Medicine | Admitting: Emergency Medicine

## 2017-12-16 DIAGNOSIS — R1032 Left lower quadrant pain: Secondary | ICD-10-CM | POA: Diagnosis present

## 2017-12-16 DIAGNOSIS — K5900 Constipation, unspecified: Secondary | ICD-10-CM | POA: Diagnosis not present

## 2017-12-16 LAB — CBC
HEMATOCRIT: 39.1 % (ref 36.0–46.0)
Hemoglobin: 12.3 g/dL (ref 12.0–15.0)
MCH: 27.8 pg (ref 26.0–34.0)
MCHC: 31.5 g/dL (ref 30.0–36.0)
MCV: 88.5 fL (ref 78.0–100.0)
Platelets: 270 10*3/uL (ref 150–400)
RBC: 4.42 MIL/uL (ref 3.87–5.11)
RDW: 12.8 % (ref 11.5–15.5)
WBC: 8.9 10*3/uL (ref 4.0–10.5)

## 2017-12-16 LAB — I-STAT BETA HCG BLOOD, ED (MC, WL, AP ONLY): I-stat hCG, quantitative: <5 m[IU]/mL (ref <5)

## 2017-12-16 LAB — COMPREHENSIVE METABOLIC PANEL
ALT: 11 U/L (ref 0–44)
AST: 14 U/L — ABNORMAL LOW (ref 15–41)
Albumin: 3.8 g/dL (ref 3.5–5.0)
Alkaline Phosphatase: 102 U/L (ref 38–126)
Anion gap: 8 (ref 5–15)
BUN: 7 mg/dL (ref 6–20)
CHLORIDE: 106 mmol/L (ref 98–111)
CO2: 26 mmol/L (ref 22–32)
CREATININE: 1.14 mg/dL — AB (ref 0.44–1.00)
Calcium: 9.2 mg/dL (ref 8.9–10.3)
GFR, EST NON AFRICAN AMERICAN: 58 mL/min — AB (ref >60)
Glucose, Bld: 96 mg/dL (ref 70–99)
POTASSIUM: 3.5 mmol/L (ref 3.5–5.1)
Sodium: 140 mmol/L (ref 135–145)
Total Bilirubin: 0.3 mg/dL (ref 0.3–1.2)
Total Protein: 6.6 g/dL (ref 6.5–8.1)

## 2017-12-16 LAB — LIPASE, BLOOD: LIPASE: 45 U/L (ref 11–51)

## 2017-12-16 NOTE — ED Triage Notes (Signed)
Patient reports LLQ pain with mild dysuria onset this week , pain radiating to left lower back and left leg , denies emesis or diarrhea , no fever or chills .

## 2017-12-17 ENCOUNTER — Emergency Department (HOSPITAL_COMMUNITY): Payer: BC Managed Care – PPO

## 2017-12-17 LAB — URINALYSIS, ROUTINE W REFLEX MICROSCOPIC
BILIRUBIN URINE: NEGATIVE
Glucose, UA: NEGATIVE mg/dL
KETONES UR: NEGATIVE mg/dL
Nitrite: NEGATIVE
PROTEIN: NEGATIVE mg/dL
Specific Gravity, Urine: 1.004 — ABNORMAL LOW (ref 1.005–1.030)
pH: 6 (ref 5.0–8.0)

## 2017-12-17 IMAGING — CT CT ABD-PELV W/ CM
2 of 5 series · 15 of 46 positions shown, 17 images · IV contrast (omnipaque)
Comparison: None.

CLINICAL DATA: Left lower quadrant pain and dysuria since this
week.

EXAM:
CT ABDOMEN AND PELVIS WITH CONTRAST
TECHNIQUE: Multidetector CT imaging of the abdomen and pelvis was performed
using the standard protocol following bolus administration of
intravenous contrast.
CONTRAST:  100mL OMNIPAQUE IOHEXOL 300 MG/ML  SOLN

[Series 3: abdomen 5.0 · axial · 0.65mm/px · z∈[+715,+1135]mm · 12 of 96 slices shown, 14 images]
[im 6/96  soft-tissue]
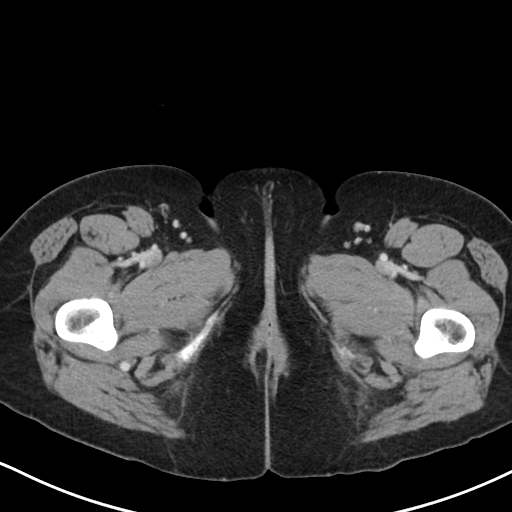
[im 6/96  bone]
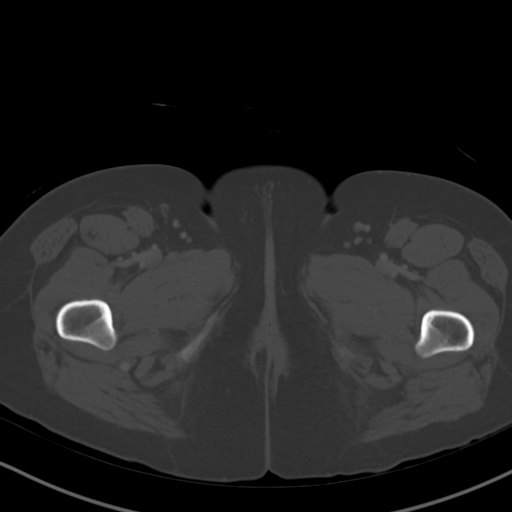
[im 17/96  soft-tissue]
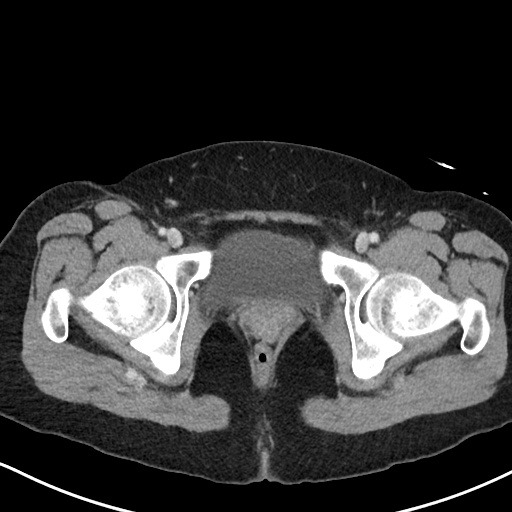
[im 23/96  soft-tissue]
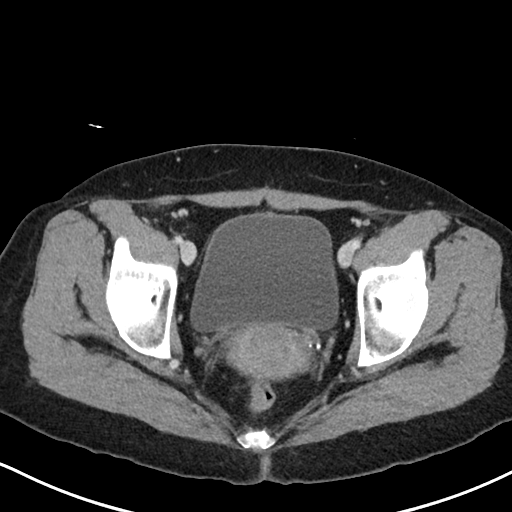
[im 28/96  soft-tissue]
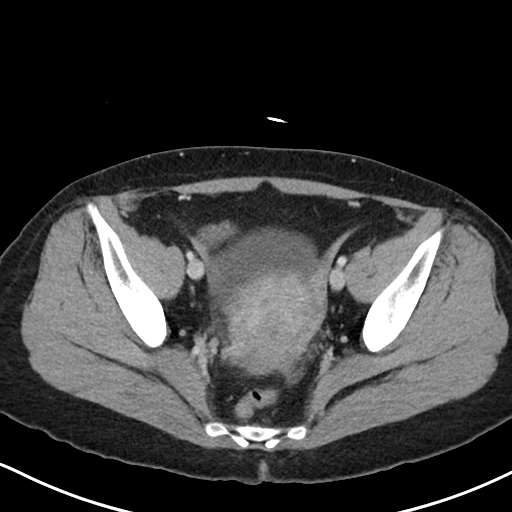
[im 40/96  soft-tissue]
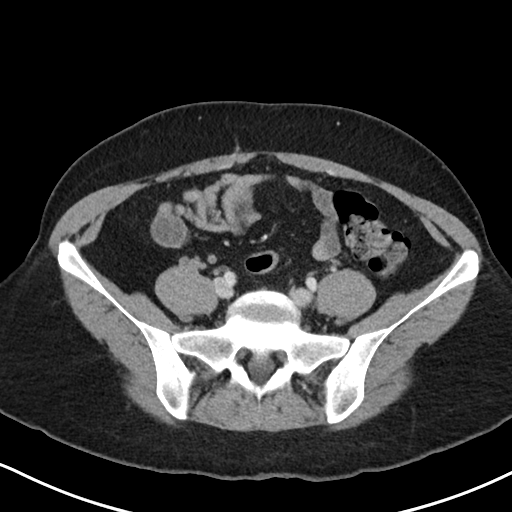
[im 45/96  soft-tissue]
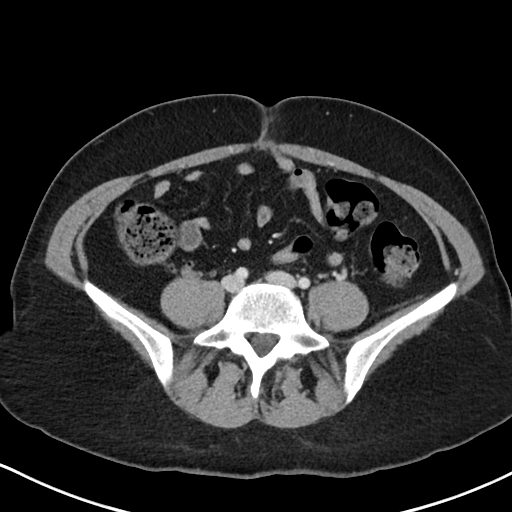
[im 51/96  soft-tissue]
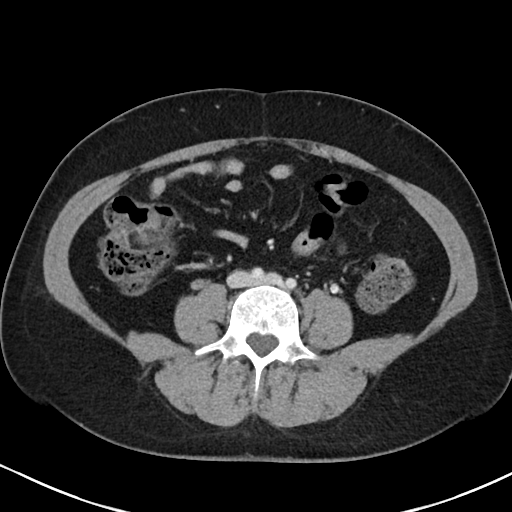
[im 62/96  soft-tissue]
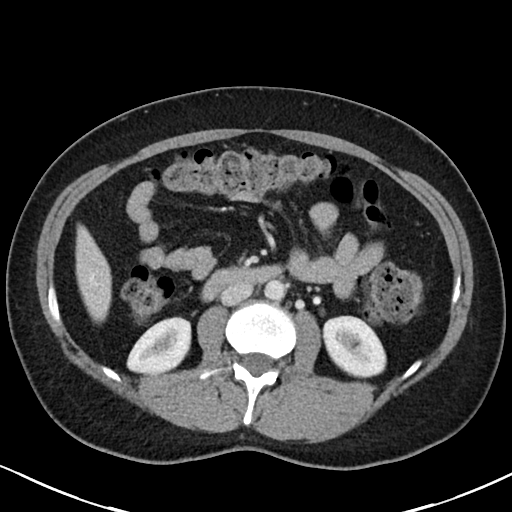
[im 68/96  soft-tissue]
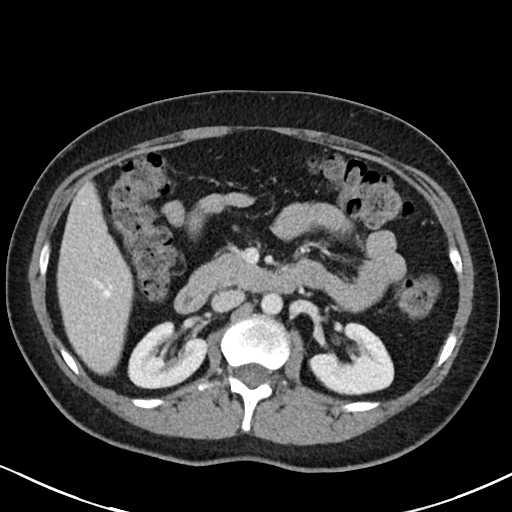
[im 68/96  bone]
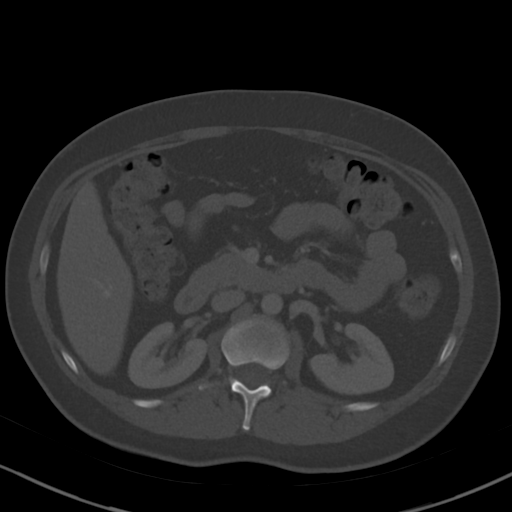
[im 73/96  soft-tissue]
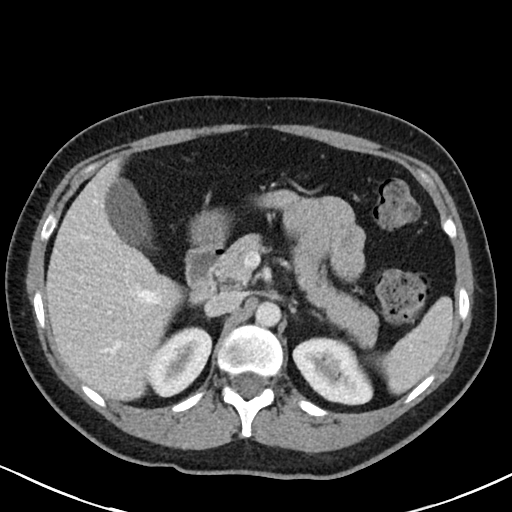
[im 84/96  soft-tissue]
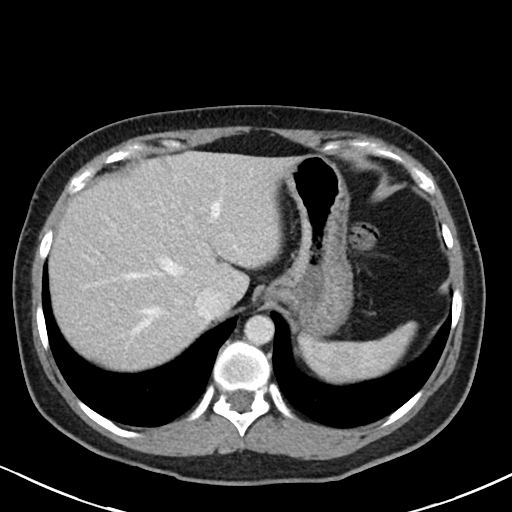
[im 90/96  soft-tissue]
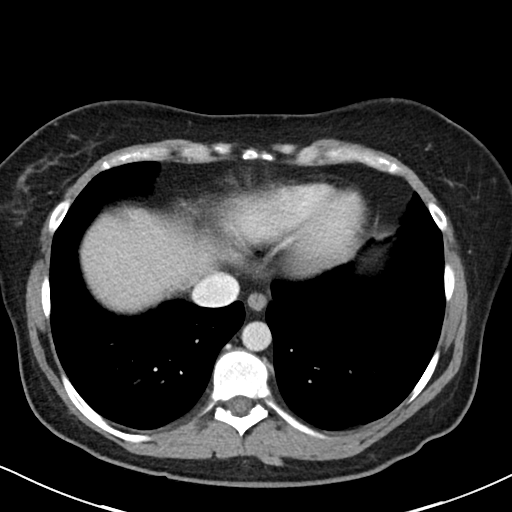

[Series 6: abdomen 3.0 mpr cor · coronal · 0.66mm/px · 3 of 80 slices shown]
[im 27/80  soft-tissue]
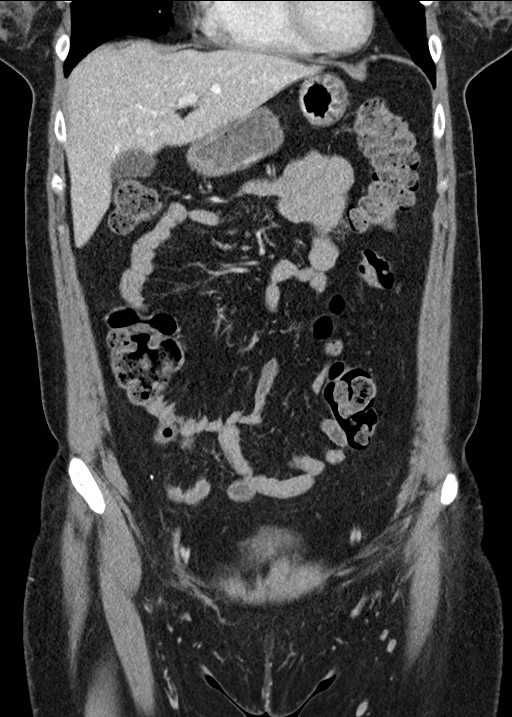
[im 36/80  soft-tissue]
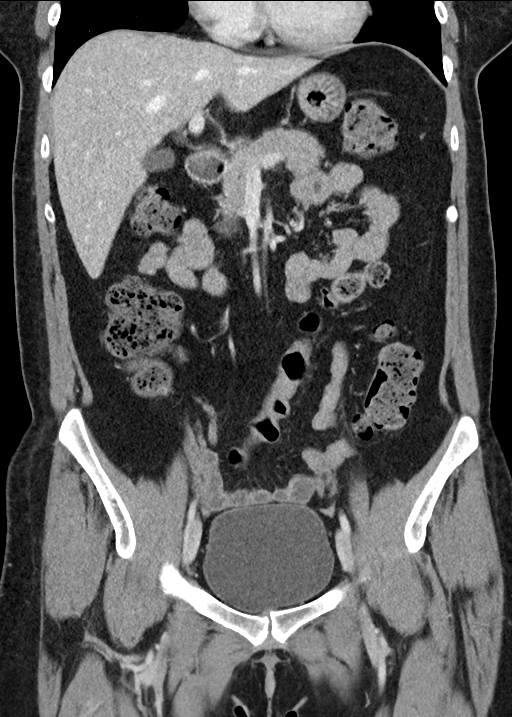
[im 44/80  soft-tissue]
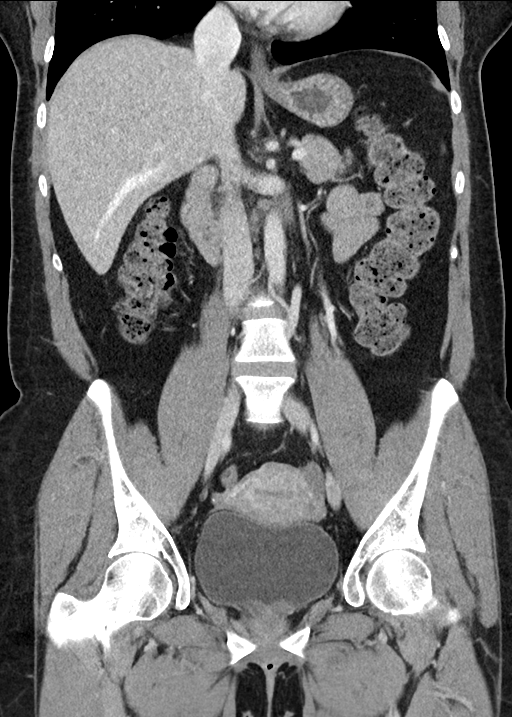

[15 of 46 positions shown; findings below may reference images not displayed]

FINDINGS: Lower chest: Possible nodule versus vascular structure measuring 8
mm demonstrated in the medial right lower lung. This is incompletely
included in only seen on the superior image.

Hepatobiliary: No focal liver abnormality is seen. No gallstones,
gallbladder wall thickening, or biliary dilatation.

Pancreas: Unremarkable. No pancreatic ductal dilatation or
surrounding inflammatory changes.

Spleen: Subcentimeter low-attenuation lesion likely representing
hemangioma. No other focal lesions. Normal spleen size.

Adrenals/Urinary Tract: No adrenal gland nodules. Kidneys are
symmetrical with homogeneous nephrograms. No hydronephrosis or
hydroureter. Bladder wall is not thickened and no bladder filling
defects are identified.

Stomach/Bowel: Stomach, small bowel, and colon are not abnormally
distended. Stool fills the colon. No wall thickening or inflammatory
changes are identified. Appendix is normal.

Vascular/Lymphatic: No significant vascular findings are present. No
enlarged abdominal or pelvic lymph nodes.

Reproductive: Uterus and bilateral adnexa are unremarkable.

Other: No abdominal wall hernia or abnormality. No abdominopelvic
ascites.

Musculoskeletal: No acute or significant osseous findings.
IMPRESSION: No acute process demonstrated in the abdomen or pelvis. No evidence
of bowel obstruction or inflammation.

8 mm nodule versus vascular structure in the medial right lower
lung. Non-contrast chest CT at 6-12 months is recommended. If the
nodule is stable at time of repeat CT, then future CT at 18-24
months (from today's scan) is considered optional for low-risk
patients, but is recommended for high-risk patients. This
recommendation follows the consensus statement: Guidelines for
Management of Incidental Pulmonary Nodules Detected on CT Images:

## 2017-12-17 MED ORDER — IOHEXOL 300 MG/ML  SOLN
100.0000 mL | Freq: Once | INTRAMUSCULAR | Status: AC | PRN
  Administered 2017-12-17: 100 mL via INTRAVENOUS

## 2017-12-17 MED ORDER — HYDROMORPHONE HCL 1 MG/ML IJ SOLN
0.5000 mg | Freq: Once | INTRAMUSCULAR | Status: AC
  Administered 2017-12-17: 0.5 mg via INTRAVENOUS
  Filled 2017-12-17: qty 1

## 2017-12-17 MED ORDER — POLYETHYLENE GLYCOL 3350 17 G PO PACK: 17.0000 g | PACK | Freq: Three times a day (TID) | ORAL | 0 refills | Status: DC

## 2017-12-17 NOTE — ED Provider Notes (Signed)
[facility]   CSN: [id number] Arrival date & time: 12/16/17  2115     History   Chief Complaint Chief Complaint  Patient presents with  . Abdominal Pain    HPI Julie Bennett is a 42 y.o. female.  Patient presents for evaluation of LLQ abdominal pain. Symptoms started one month ago and have been intermittent during that time, becoming more frequent and more intense over the last 2 days. No fever or vomiting. She continues to have bowel movements that are well formed, without melena or bleeding. Moving her bowels increases her pain. She reports that emptying her bladder also affects the pain in the LLQ. No vaginal discharge, specific dysuria. She states the pain is now radiating into her back and is "excruciating". No modifying factors.   The history is provided by the patient. No language interpreter was used.  Abdominal Pain   Pertinent negatives include fever, diarrhea, vomiting and constipation.    Past Medical History:  Diagnosis Date  . Amenorrhea   . Anxiety   . Depression   . Headache     Patient Active Problem List   Diagnosis Date Noted  . 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 DILATION AND CURETTAGE OF UTERUS    . hsyteroscopy    . LAPAROSCOPY    . TUBAL LIGATION       OB History   None      Home Medications    Prior to Admission medications   Medication Sig Start Date End Date Taking? Authorizing Provider  albuterol (PROAIR HFA) 108 (90 Base) MCG/ACT inhaler 2 PUFFS EVERY 4-6 HOURS AS NEEDED FOR COUGH OR WHEEZING 09/26/16   [provider]  estradiol (ESTRACE) 0.5 MG tablet Take 0.5 mg by mouth. 07/07/16 07/07/17  [provider]  omeprazole (PRILOSEC) 40 MG capsule Take 40 mg by mouth. 11/10/16 11/10/17  [provider]  progesterone (PROMETRIUM) 100 MG capsule Take 100 mg by mouth. 07/07/16 07/07/17  [provider]  rizatriptan (MAXALT-MLT) 10 MG disintegrating tablet Take 1 tablet (10 mg total) by mouth as needed for migraine. 09/02/17   Micki RileySethi, Pramod S, MD  topiramate (TOPAMAX) 25 MG tablet TAKE 1 TABLET (25 MG TOTAL) BY MOUTH TWO TIMES DAILY. 12/22/16   Micki RileySethi, Pramod S, MD    Family History Family History  Problem Relation Age of Onset  . Ovarian cancer Mother   . Throat cancer Father     Social History Social History   Tobacco Use  . Smoking status: Never Smoker  . Smokeless tobacco: Never Used  Substance Use Topics  . Alcohol use: No  . Drug use: No     Allergies   Patient has no known allergies.   Review of Systems Review of Systems  Constitutional: Negative for chills and fever.  Respiratory: Negative.  Negative for shortness of breath.   Cardiovascular: Negative.   Gastrointestinal: Positive for abdominal pain. Negative for blood in stool, constipation, diarrhea and vomiting.  Genitourinary:       See HPI.  Musculoskeletal: Negative.   Skin: Negative.   Neurological: Positive for weakness.     Physical Exam Updated Vital Signs BP 135/83 (BP Location: Right Arm)   Pulse 66   Temp 98.6 F (37 C) (Oral)   Resp 16   SpO2 97%   Physical Exam  Constitutional: She appears well-developed and well-nourished.  HENT:  Head: Normocephalic.  Neck:  Normal range of motion. Neck supple.  Cardiovascular: Normal rate and regular rhythm.  Pulmonary/Chest: Effort normal and breath sounds normal.  Abdominal: Soft. Bowel sounds are decreased. There is tenderness in the left lower quadrant. There is guarding. There is no rigidity and no rebound.  Musculoskeletal: Normal range of motion.  Neurological: She is alert. No cranial nerve deficit.  Skin: Skin is warm and dry. No rash noted.  Psychiatric: She has a normal mood and affect.     ED Treatments / Results  Labs (all labs ordered are listed, but only abnormal results are displayed) Labs Reviewed  COMPREHENSIVE METABOLIC  PANEL - Abnormal; Notable for the following components:      Result Value   Creatinine, Ser 1.14 (*)    AST 14 (*)    GFR calc non Af Amer 58 (*)    All other components within normal limits  URINALYSIS, ROUTINE W REFLEX MICROSCOPIC - Abnormal; Notable for the following components:   Color, Urine STRAW (*)    Specific Gravity, Urine 1.004 (*)    Hgb urine dipstick SMALL (*)    Leukocytes, UA TRACE (*)    Bacteria, UA RARE (*)    All other components within normal limits  LIPASE, BLOOD  CBC  I-STAT BETA HCG BLOOD, ED ([facility], [facility], [facility] ONLY)    EKG None  Radiology No results found.  Procedures Procedures (including critical care time)  Medications Ordered in ED Medications  HYDROmorphone (DILAUDID) injection 0.5 mg (0.5 mg Intravenous Given 12/17/17 0013)     Initial Impression / Assessment and Plan / ED Course  I have reviewed the triage vital signs and the nursing notes.  Pertinent labs & imaging results that were available during my care of the patient were reviewed by me and considered in my medical decision making (see chart for details).     Patient with LLQ abd pain intermittent x 1 month, now more frequent with greater pain. She is tender specific to left lower abdomen. No pelvic symptoms of discharge, bleeding (she is in early menopause). No fever.   She will need a CT to evaluate for diverticulitis. Labs reassuring, no leukocytosis or fever. VSS. Pain medication provided. Will observe.   Pain is some better with pain medications. CT performed and shows only large stool burden. No evidence obstruction, inflammation, infection or pelvic abnormality.   Discussed treatment for constipation with the patient. Will provide GI referral is symptoms are persistent/recurrent.  Final Clinical Impressions(s) / ED Diagnoses   Final diagnoses:  None   1. Abdominal pain 2. Constipation   ED Discharge Orders    None       Danne Harbor 12/17/17 0158    Dione Booze, MD 12/17/17 [phone]

## 2018-05-17 ENCOUNTER — Encounter (HOSPITAL_COMMUNITY): Payer: Self-pay | Admitting: Radiology

## 2018-05-17 ENCOUNTER — Emergency Department (HOSPITAL_COMMUNITY): Payer: BC Managed Care – PPO

## 2018-05-17 ENCOUNTER — Other Ambulatory Visit: Payer: Self-pay

## 2018-05-17 ENCOUNTER — Emergency Department (HOSPITAL_COMMUNITY)
Admission: EM | Admit: 2018-05-17 | Discharge: 2018-05-17 | Disposition: A | Discharge status: Home / Self Care | Payer: BC Managed Care – PPO | Attending: Emergency Medicine | Admitting: Emergency Medicine

## 2018-05-17 DIAGNOSIS — R0602 Shortness of breath: Secondary | ICD-10-CM | POA: Diagnosis not present

## 2018-05-17 DIAGNOSIS — R0789 Other chest pain: Secondary | ICD-10-CM

## 2018-05-17 DIAGNOSIS — Z79899 Other long term (current) drug therapy: Secondary | ICD-10-CM | POA: Diagnosis not present

## 2018-05-17 LAB — CBC WITH DIFFERENTIAL/PLATELET
Abs Immature Granulocytes: 0.01 10*3/uL (ref 0.00–0.07)
Basophils Absolute: 0.1 10*3/uL (ref 0.0–0.1)
Basophils Relative: 1 %
EOS ABS: 0.1 10*3/uL (ref 0.0–0.5)
EOS PCT: 2 %
HEMATOCRIT: 39.8 % (ref 36.0–46.0)
Hemoglobin: 13.1 g/dL (ref 12.0–15.0)
Immature Granulocytes: 0 %
LYMPHS ABS: 2 10*3/uL (ref 0.7–4.0)
Lymphocytes Relative: 33 %
MCH: 29.9 pg (ref 26.0–34.0)
MCHC: 32.9 g/dL (ref 30.0–36.0)
MCV: 90.9 fL (ref 80.0–100.0)
Monocytes Absolute: 0.5 10*3/uL (ref 0.1–1.0)
Monocytes Relative: 8 %
NRBC: 0 % (ref 0.0–0.2)
Neutro Abs: 3.4 10*3/uL (ref 1.7–7.7)
Neutrophils Relative %: 56 %
Platelets: 240 10*3/uL (ref 150–400)
RBC: 4.38 MIL/uL (ref 3.87–5.11)
RDW: 12.1 % (ref 11.5–15.5)
WBC: 6.1 10*3/uL (ref 4.0–10.5)

## 2018-05-17 LAB — D-DIMER, QUANTITATIVE: D-Dimer, Quant: 0.58 ug/mL-FEU — ABNORMAL HIGH (ref 0.00–0.50)

## 2018-05-17 LAB — I-STAT TROPONIN, ED
TROPONIN I, POC: 0 ng/mL (ref 0.00–0.08)
Troponin i, poc: 0 ng/mL (ref 0.00–0.08)

## 2018-05-17 LAB — BASIC METABOLIC PANEL
ANION GAP: 7 (ref 5–15)
BUN: 11 mg/dL (ref 6–20)
CHLORIDE: 109 mmol/L (ref 98–111)
CO2: 23 mmol/L (ref 22–32)
Calcium: 9 mg/dL (ref 8.9–10.3)
Creatinine, Ser: 0.96 mg/dL (ref 0.44–1.00)
GFR calc Af Amer: >60 mL/min (ref >60)
GFR calc non Af Amer: >60 mL/min (ref >60)
Glucose, Bld: 105 mg/dL — ABNORMAL HIGH (ref 70–99)
POTASSIUM: 3.5 mmol/L (ref 3.5–5.1)
Sodium: 139 mmol/L (ref 135–145)

## 2018-05-17 LAB — I-STAT BETA HCG BLOOD, ED (MC, WL, AP ONLY)

## 2018-05-17 IMAGING — DX DG CHEST 2V
2 series · 2 of 2 positions shown · non-contrast
Comparison: None.

CLINICAL DATA: Chest pain and cough

EXAM:
CHEST - 2 VIEW

[w chest pa]
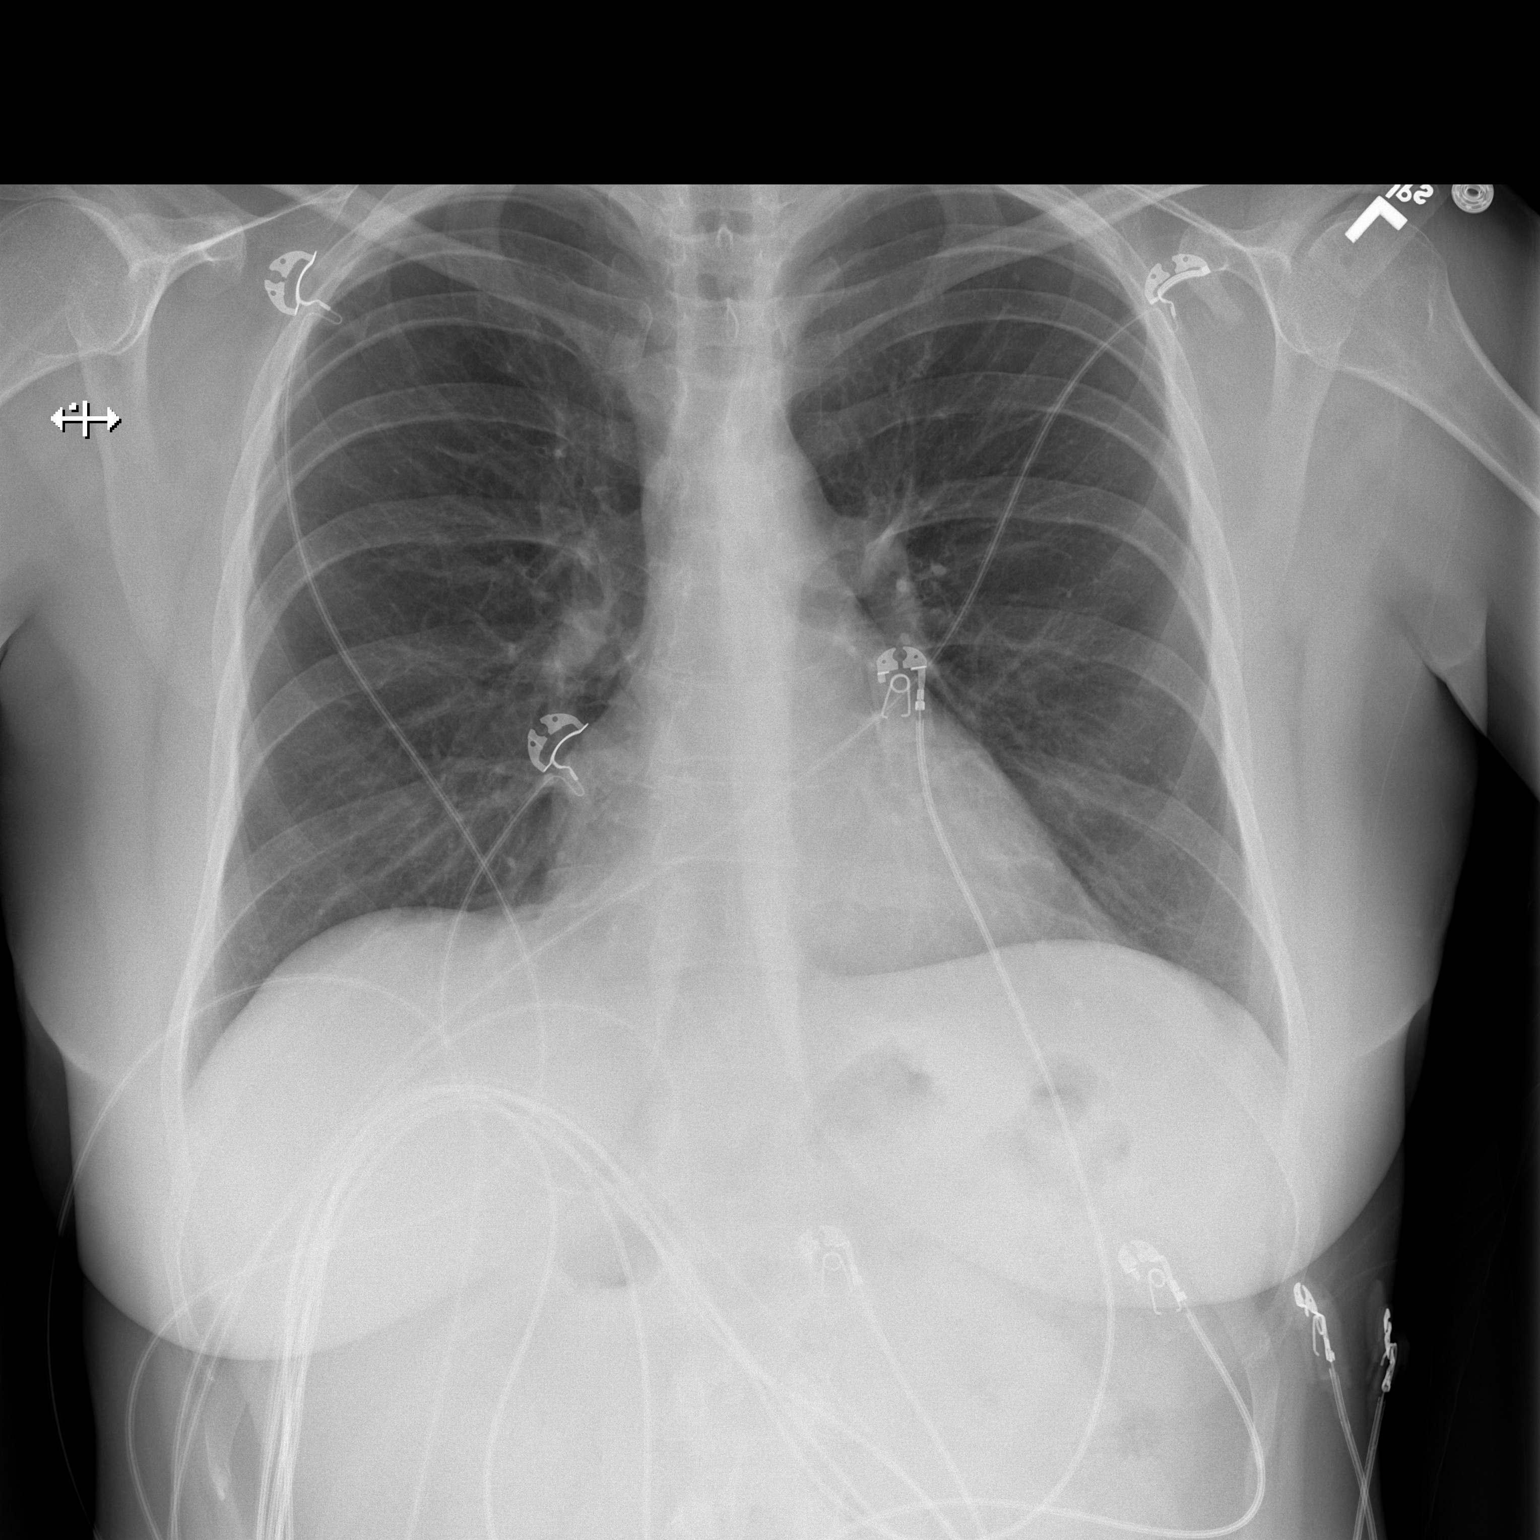

[w chest lat]
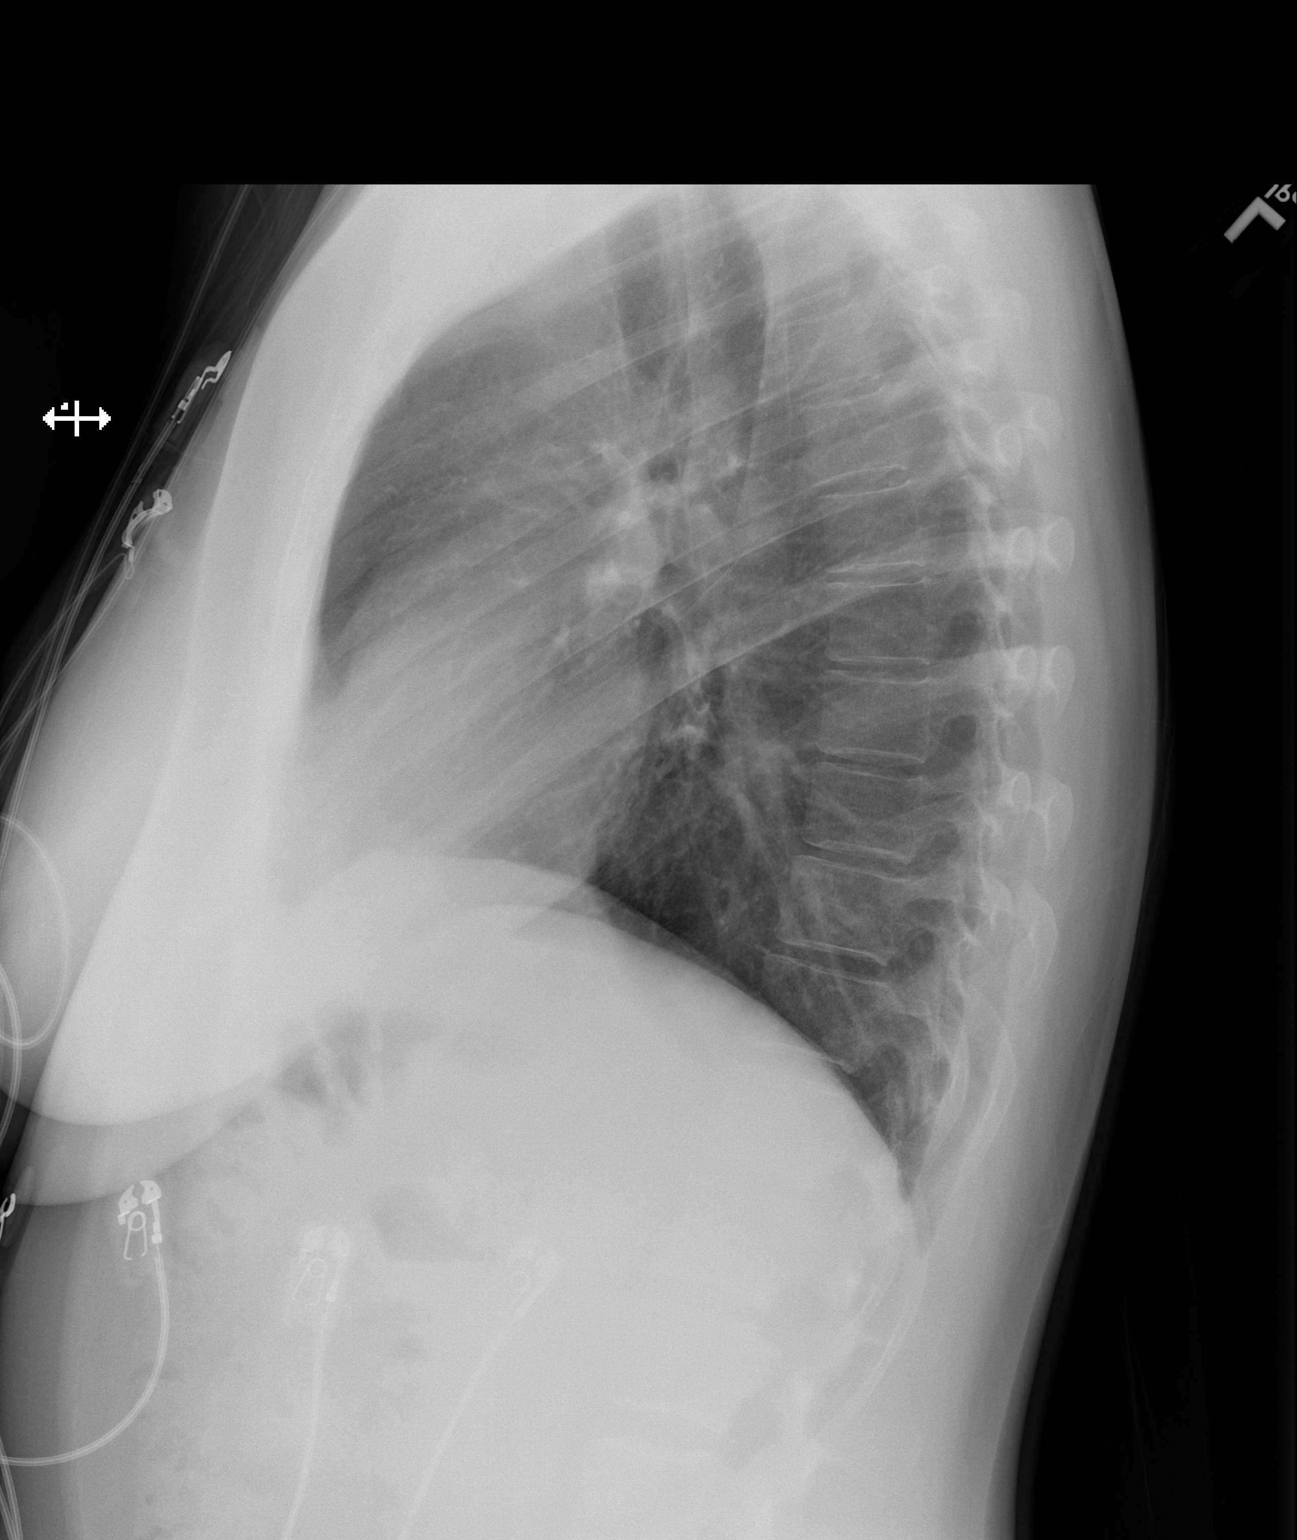

[2 of 2 positions shown; findings below may reference images not displayed]

FINDINGS: The lungs are clear. Heart size and pulmonary vascularity are
normal. No adenopathy. No pneumothorax. No bone lesions.
IMPRESSION: No edema or consolidation.

## 2018-05-17 IMAGING — CT CT ANGIO CHEST
2 of 6 series · 19 of 36 positions shown · IV contrast (iopamidol)
Comparison: None.

CLINICAL DATA: Chest pain, shortness of breath

EXAM:
CT ANGIOGRAPHY CHEST WITH CONTRAST
TECHNIQUE: Multidetector CT imaging of the chest was performed using the
standard protocol during bolus administration of intravenous
contrast. Multiplanar CT image reconstructions and MIPs were
obtained to evaluate the vascular anatomy.
CONTRAST:  100mL [XF] IOPAMIDOL ([XF]) INJECTION 76%

[Series 7: pe thins · axial · 0.63mm/px · z∈[+1140,+1378]mm · 18 of 378 slices shown]
[im 19/378  lung]
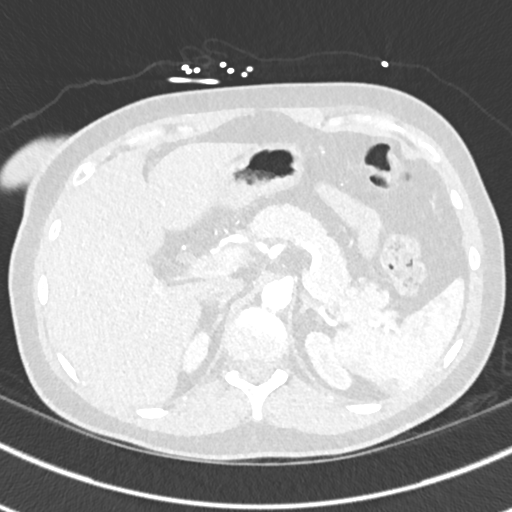
[im 38/378  mediastinal]
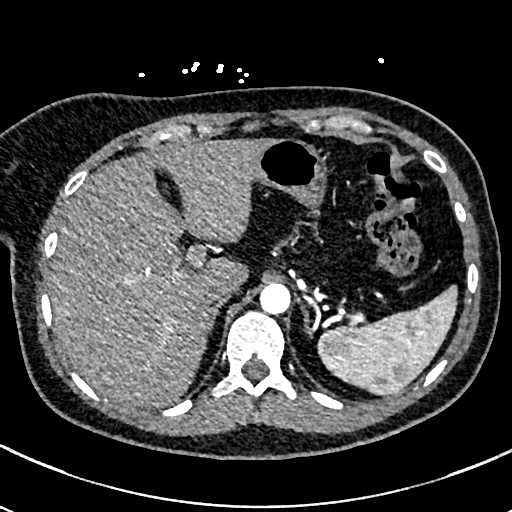
[im 57/378  lung]
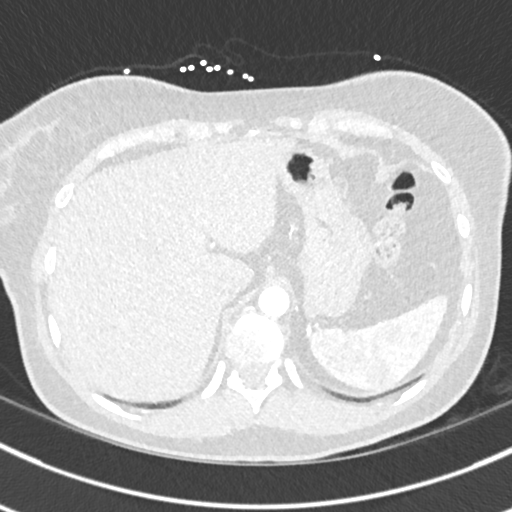
[im 76/378  mediastinal]
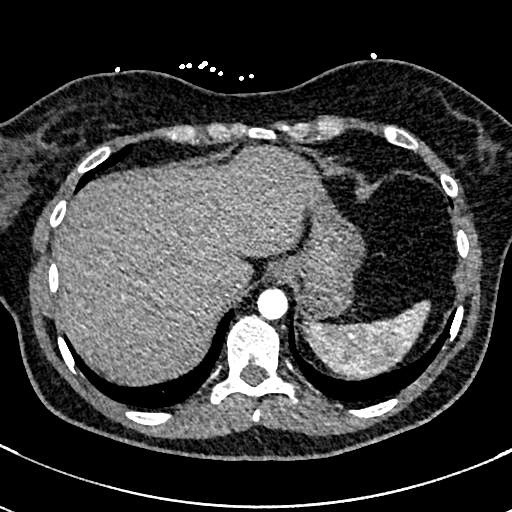
[im 95/378  lung]
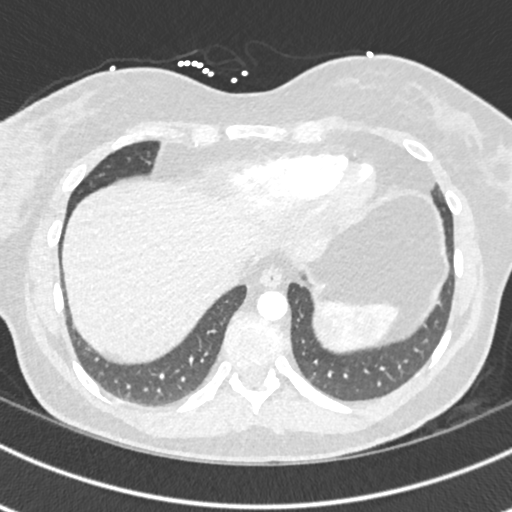
[im 114/378  mediastinal]
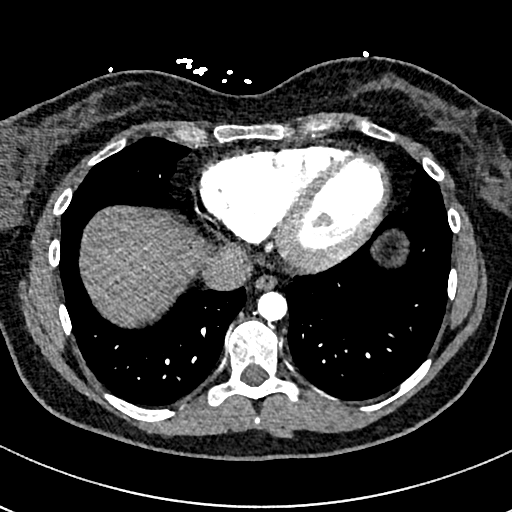
[im 132/378  lung]
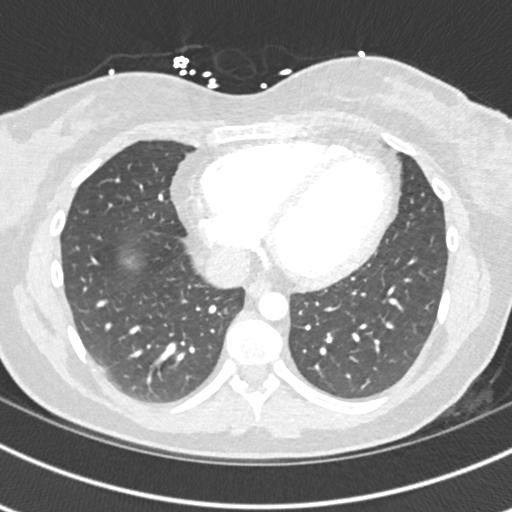
[im 151/378  mediastinal]
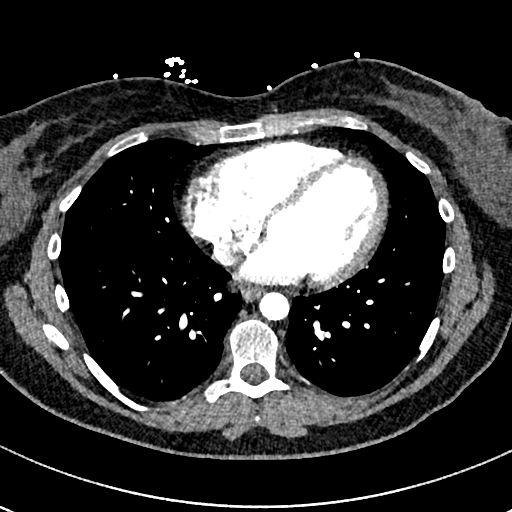
[im 170/378  lung]
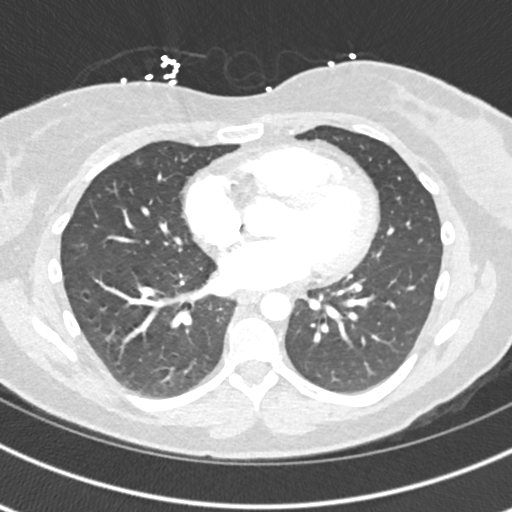
[im 208/378  mediastinal]
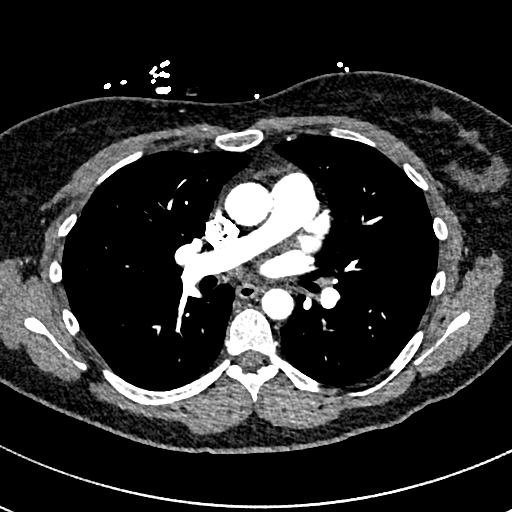
[im 227/378  lung]
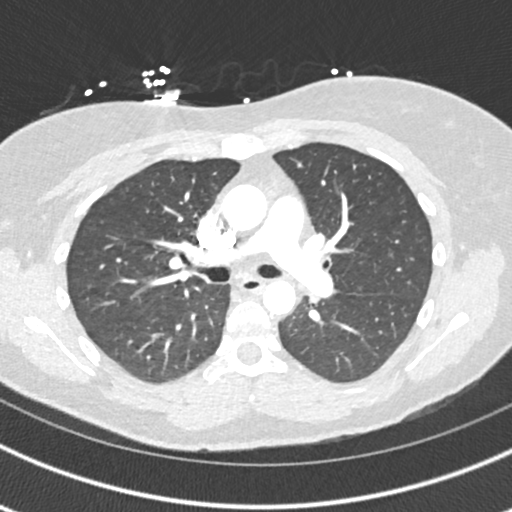
[im 246/378  mediastinal]
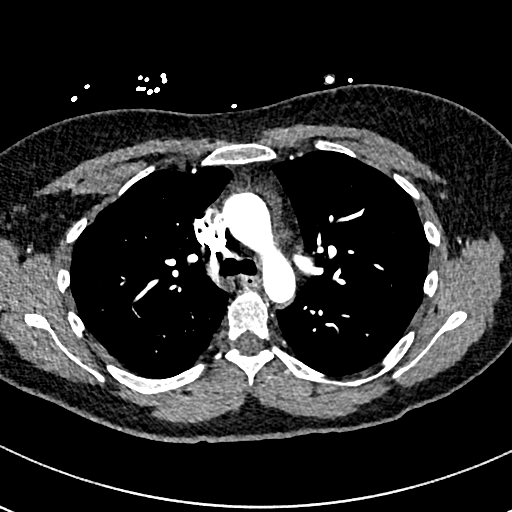
[im 264/378  lung]
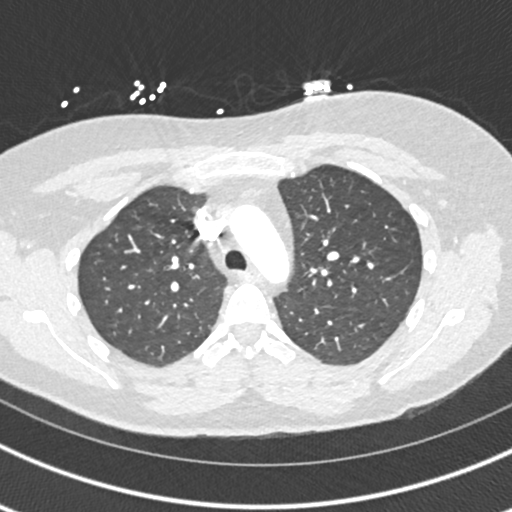
[im 283/378  mediastinal]
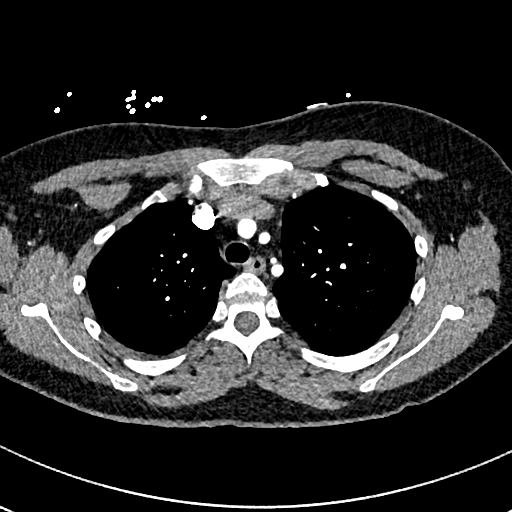
[im 302/378  lung]
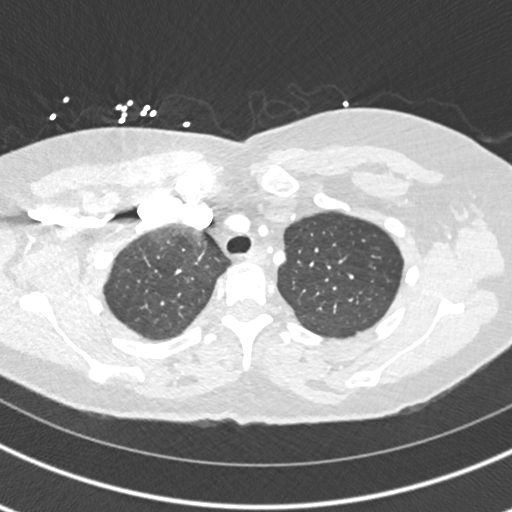
[im 321/378  mediastinal]
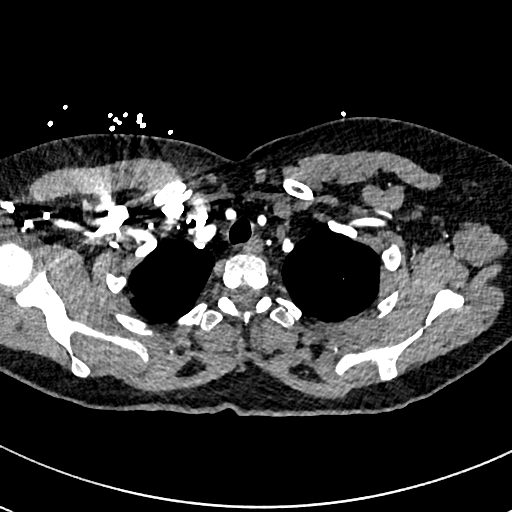
[im 340/378  lung]
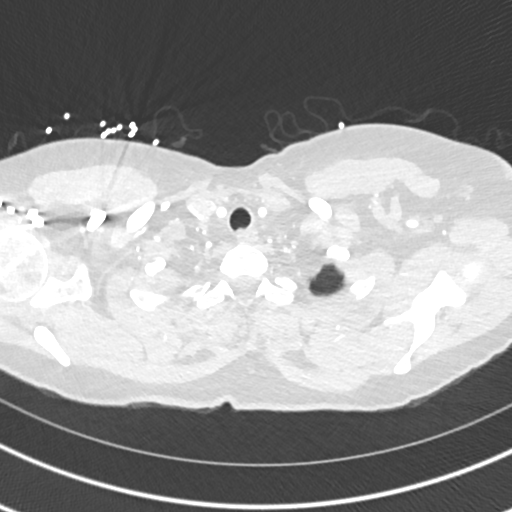
[im 359/378  mediastinal]
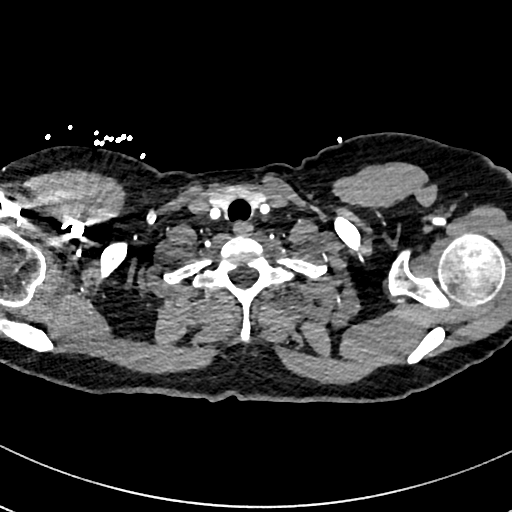

[Series 8: pe 2mm cor · coronal · 0.59mm/px · 1 of 151 slices shown]
[im 76/151  mediastinal]
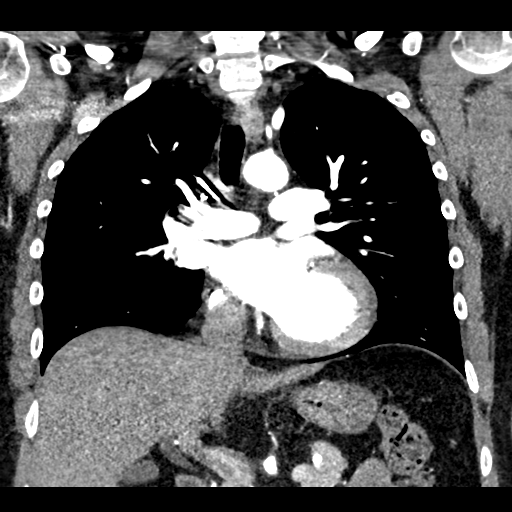

[19 of 36 positions shown; findings below may reference images not displayed]

FINDINGS: Cardiovascular: Heart is normal size. Aorta is normal caliber. No
filling defects in the pulmonary arteries to suggestpulmonary
emboli.

Mediastinum/Nodes: No mediastinal, hilar, or axillary adenopathy.

Lungs/Pleura: Lungs are clear. No focal airspace opacities or
suspicious nodules. No effusions.

Upper Abdomen: Imaging into the upper abdomen shows no acute
findings.

Musculoskeletal: No acute bony abnormality.

Review of the MIP images confirms the above findings.
IMPRESSION: No evidence of pulmonary embolus.  No acute findings.

## 2018-05-17 MED ORDER — ASPIRIN 81 MG PO CHEW
324.0000 mg | CHEWABLE_TABLET | Freq: Once | ORAL | Status: AC
Start: 2018-05-17 — End: 2018-05-17
  Administered 2018-05-17: 324 mg via ORAL
  Filled 2018-05-17: qty 4

## 2018-05-17 MED ORDER — IOPAMIDOL (ISOVUE-370) INJECTION 76%
100.0000 mL | Freq: Once | INTRAVENOUS | Status: AC | PRN
  Administered 2018-05-17: 100 mL via INTRAVENOUS
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
## 2018-05-17 NOTE — Discharge Instructions (Signed)
You were evaluated in the emergency department for chest pain.    Based on your risk factors, work up and exam you are considered low risk for major adverse cardiac events in the next 30 days.  This means you can be discharged with close follow up with cardiology for further outpatient work up.  Call cardiology as soon as possible to establish care and further discussion and work up of your symptoms on an outpatient setting.   Please return to ED if: Your chest pain is worse or on exertion You have a cough that gets worse, or you cough up blood. You have severe pain in chest, back or abdomen. You have chest pain or shortness of breath with exertion or activity You have sudden, unexplained discomfort in your chest, with radiation arms, back, neck, or jaw. You suddenly have chest pain and begin to sweat, or your skin gets clammy. You feel chest pain with nausea or vomiting. You suddenly feel light-headed or faint. Your heart begins to beat quickly, or it feels like it is skipping beats. You have one sided leg swelling or calf pain

## 2018-05-17 NOTE — ED Notes (Signed)
Patient transported to CT 

## 2018-05-17 NOTE — ED Triage Notes (Signed)
Pt reports that she started to feel chest pain in the center of her chest on Thursday evening. Pt states that she has had pain off and on since then. Pt states that she woke up this morning feeling very short of breath, with left arm pain and lip tingling. Pt denies any current chest pain.
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
## 2018-06-21 ENCOUNTER — Ambulatory Visit: Payer: BC Managed Care – PPO | Admitting: Cardiovascular Disease

## 2018-06-21 VITALS — BP 112/72 | HR 74 | Ht 65.0 in | Wt 156.0 lb

## 2018-06-21 DIAGNOSIS — R0600 Dyspnea, unspecified: Secondary | ICD-10-CM | POA: Diagnosis not present

## 2018-06-21 DIAGNOSIS — R0789 Other chest pain: Secondary | ICD-10-CM

## 2018-06-21 NOTE — Patient Instructions (Addendum)
Medication Instructions:   If you need a refill on your cardiac medications before your next appointment, please call your pharmacy.   Lab work:  If you have labs (blood work) drawn today and your tests are completely normal, you will receive your results only by: . MyChart Message (if you have MyChart) OR . A paper copy in the mail If you have any lab test that is abnormal or we need to change your treatment, we will call you to review the results.  Testing/Procedures: Your physician has requested that you have an exercise tolerance test. For further information please visit [email or web address]. Please also follow instruction sheet, as given.  Your physician has requested that you have an echocardiogram. Echocardiography is a painless test that uses sound waves to create images of your heart. It provides your doctor with information about the size and shape of your heart and how well your heart's chambers and valves are working. This procedure takes approximately one hour. There are no restrictions for this procedure.  Follow-Up: At [facility], you and your health needs are our priority.  As part of our continuing mission to provide you with exceptional heart care, we have created designated Provider Care Teams.  These Care Teams include your primary Cardiologist (physician) and Advanced Practice Providers (APPs -  Physician Assistants and Nurse Practitioners) who all work together to provide you with the care you need, when you need it. Your physician recommends that you schedule a follow-up appointment as needed with Dr. Nishan.      

## 2018-07-12 ENCOUNTER — Other Ambulatory Visit (HOSPITAL_COMMUNITY): Payer: BC Managed Care – PPO

## 2018-09-24 ENCOUNTER — Other Ambulatory Visit: Payer: Self-pay | Admitting: Neurology

## 2018-09-24 DIAGNOSIS — G43919 Migraine, unspecified, intractable, without status migrainosus: Secondary | ICD-10-CM

## 2018-10-09 ENCOUNTER — Other Ambulatory Visit: Payer: Self-pay | Admitting: Neurology

## 2018-10-09 DIAGNOSIS — G43919 Migraine, unspecified, intractable, without status migrainosus: Secondary | ICD-10-CM

## 2018-11-09 ENCOUNTER — Other Ambulatory Visit (HOSPITAL_COMMUNITY): Payer: Self-pay | Admitting: Obstetrics & Gynecology

## 2018-11-09 ENCOUNTER — Other Ambulatory Visit: Payer: Self-pay | Admitting: Obstetrics & Gynecology

## 2018-11-09 DIAGNOSIS — R1032 Left lower quadrant pain: Secondary | ICD-10-CM

## 2018-11-19 ENCOUNTER — Other Ambulatory Visit: Payer: Self-pay

## 2018-11-19 ENCOUNTER — Encounter (HOSPITAL_COMMUNITY): Payer: Self-pay

## 2018-11-19 ENCOUNTER — Ambulatory Visit (HOSPITAL_COMMUNITY)
Admission: RE | Admit: 2018-11-19 | Discharge: 2018-11-19 | Disposition: A | Discharge status: Home / Self Care | Payer: BC Managed Care – PPO | Source: Ambulatory Visit | Attending: Obstetrics & Gynecology | Admitting: Obstetrics & Gynecology

## 2018-11-19 DIAGNOSIS — R1032 Left lower quadrant pain: Secondary | ICD-10-CM | POA: Diagnosis not present

## 2018-11-19 IMAGING — CT CT ABDOMEN AND PELVIS WITH CONTRAST
2 of 5 series · 16 of 46 positions shown, 18 images · IV contrast (OMNIPAQUE)
Comparison: [DATE]

CLINICAL DATA: Left lower quadrant abdominal pain for about 1 year.

EXAM:
CT ABDOMEN AND PELVIS WITH CONTRAST
TECHNIQUE: Multidetector CT imaging of the abdomen and pelvis was performed
using the standard protocol following bolus administration of
intravenous contrast.
CONTRAST:  100mL OMNIPAQUE IOHEXOL 300 MG/ML  SOLN

[Series 2: axial st · axial · 0.71mm/px · z∈[+1087,+1527]mm · 13 of 102 slices shown, 15 images]
[im 7/102  soft-tissue]
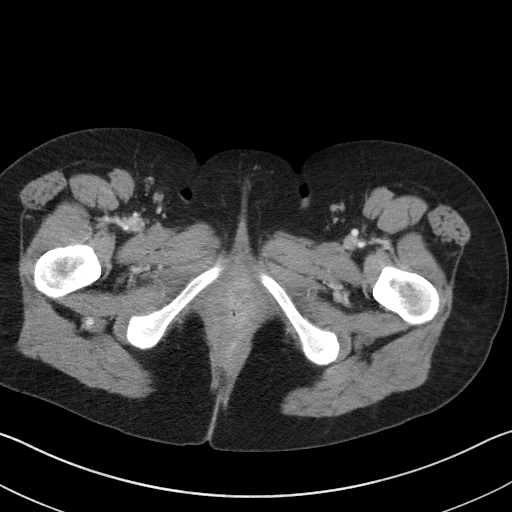
[im 7/102  bone]
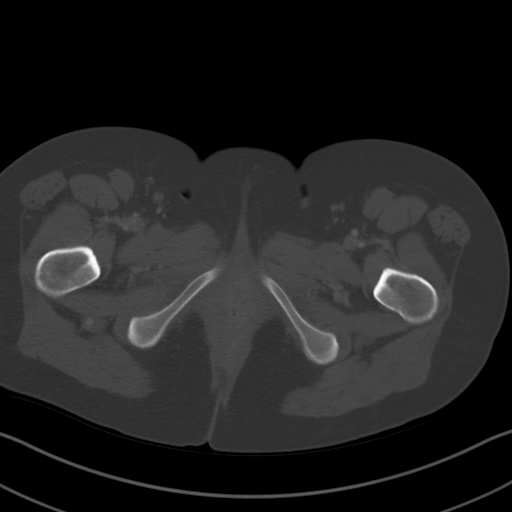
[im 13/102  soft-tissue]
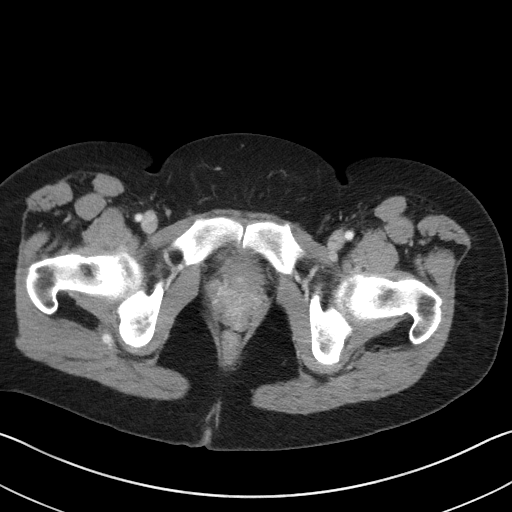
[im 19/102  soft-tissue]
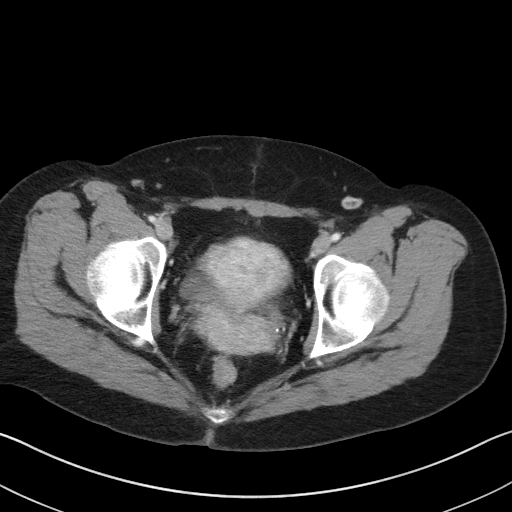
[im 32/102  soft-tissue]
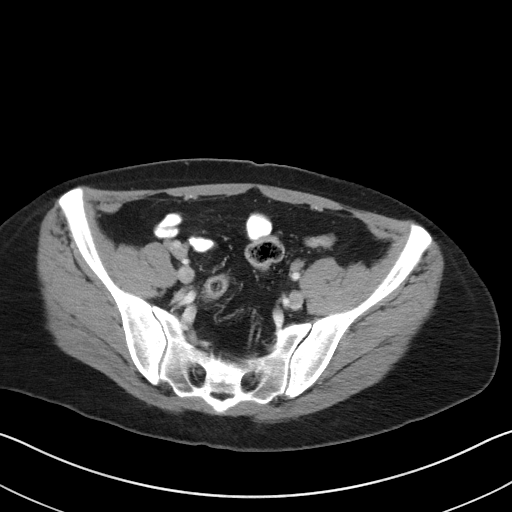
[im 38/102  soft-tissue]
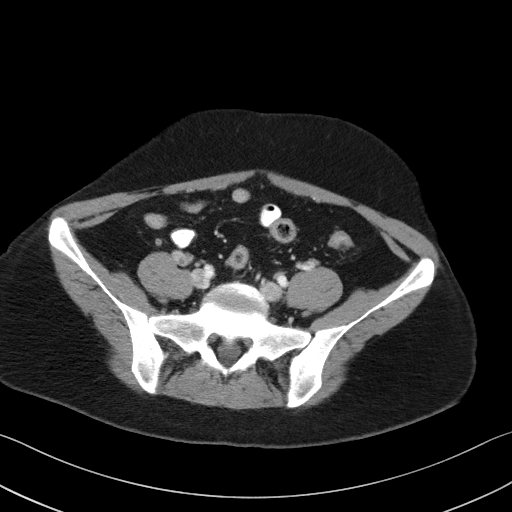
[im 45/102  soft-tissue]
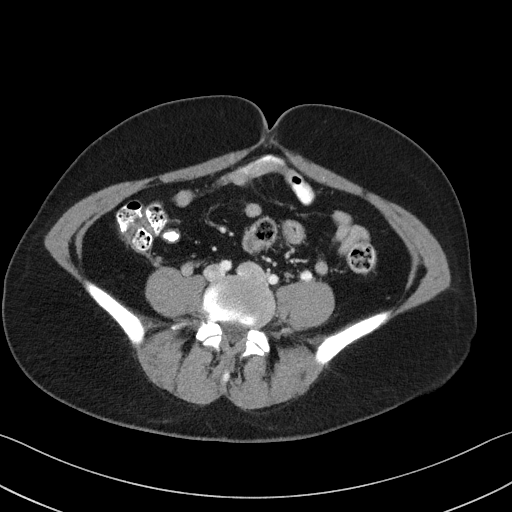
[im 51/102  soft-tissue]
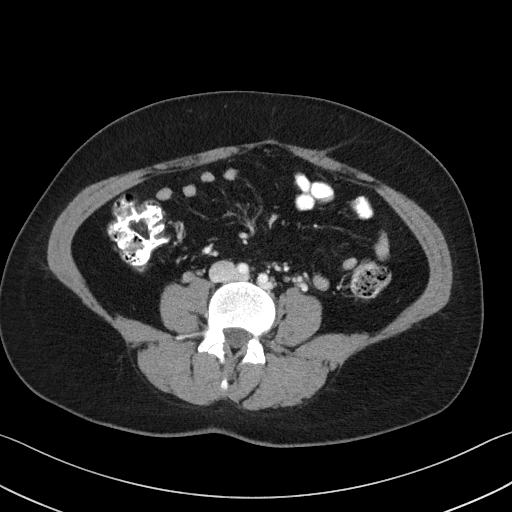
[im 57/102  soft-tissue]
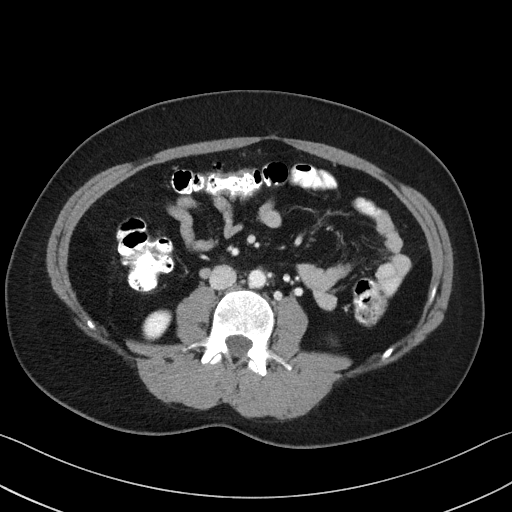
[im 64/102  soft-tissue]
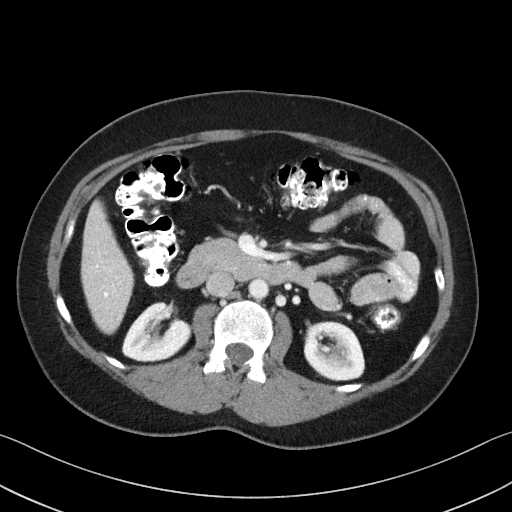
[im 64/102  bone]
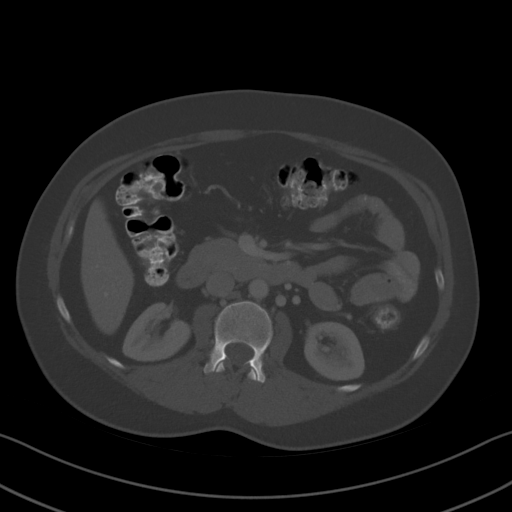
[im 70/102  soft-tissue]
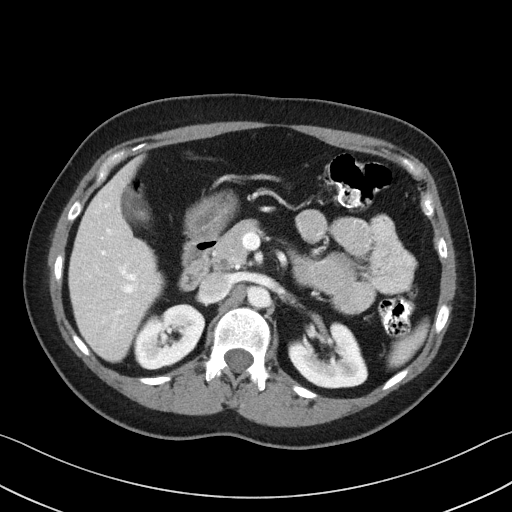
[im 83/102  soft-tissue]
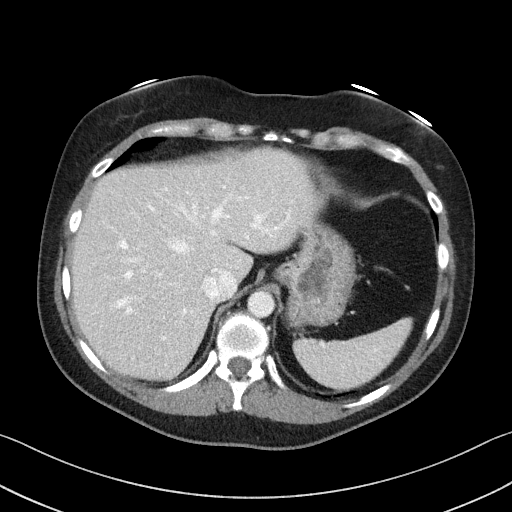
[im 89/102  soft-tissue]
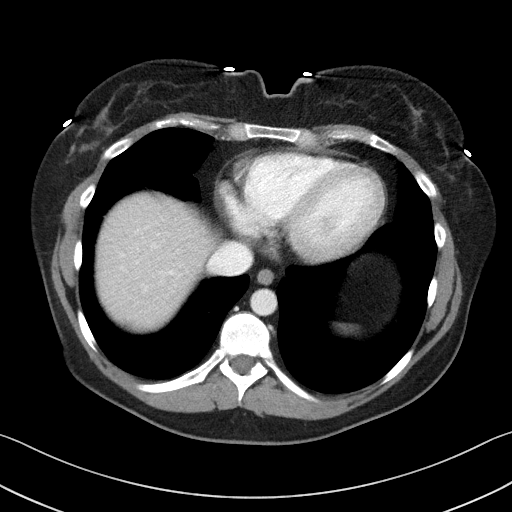
[im 95/102  soft-tissue]
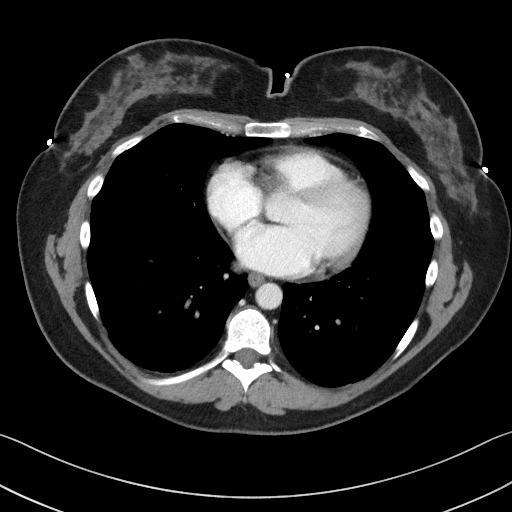

[Series 4: coronal st · coronal · 0.91mm/px · 3 of 74 slices shown]
[im 25/74  soft-tissue]
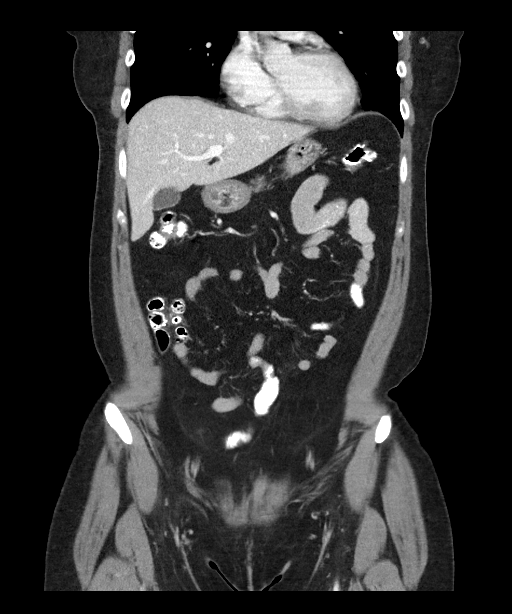
[im 33/74  soft-tissue]
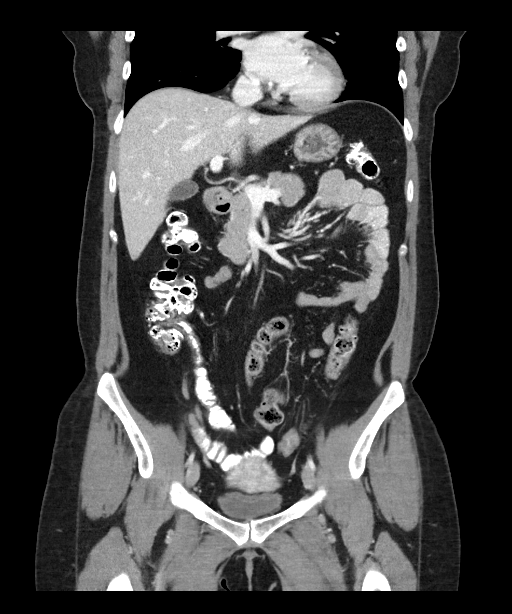
[im 41/74  soft-tissue]
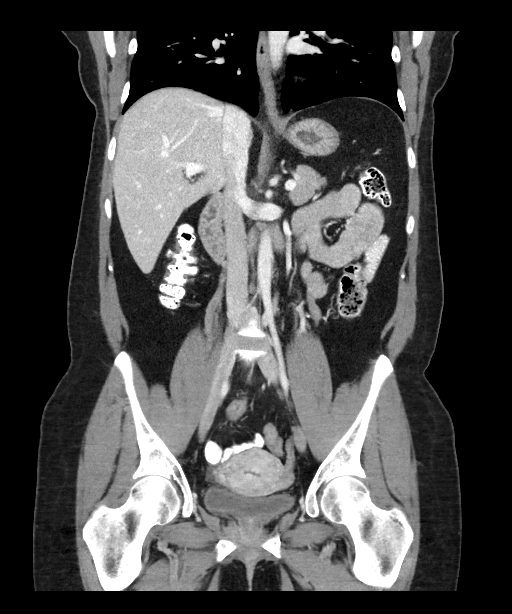

[16 of 46 positions shown; findings below may reference images not displayed]

FINDINGS: Lower chest: Unremarkable.

Hepatobiliary: No suspicious focal abnormality within the liver
parenchyma. There is no evidence for gallstones, gallbladder wall
thickening, or pericholecystic fluid. No intrahepatic or
extrahepatic biliary dilation.

Pancreas: No focal mass lesion. No dilatation of the main duct. No
intraparenchymal cyst. No peripancreatic edema.

Spleen: 10 mm low-density lesion in the subcapsular spleen cannot be
definitively characterized but is likely benign. This is stable
since prior study.

Adrenals/Urinary Tract: No adrenal nodule or mass. Kidneys
unremarkable. No evidence for hydroureter. The urinary bladder
appears normal for the degree of distention.

Stomach/Bowel: Stomach is unremarkable. No gastric wall thickening.
No evidence of outlet obstruction. Duodenum is normally positioned
as is the ligament of Treitz. No small bowel wall thickening. No
small bowel dilatation. The terminal ileum is normal. The appendix
is normal. No gross colonic mass. No colonic wall thickening.

Vascular/Lymphatic: No abdominal aortic aneurysm. No abdominal
aortic atherosclerotic calcification. There is no gastrohepatic or
hepatoduodenal ligament lymphadenopathy. No intraperitoneal or
retroperitoneal lymphadenopathy. No pelvic sidewall lymphadenopathy.

Reproductive: The uterus is unremarkable.  There is no adnexal mass.

Other: No intraperitoneal free fluid.

Musculoskeletal: No worrisome lytic or sclerotic osseous
abnormality.
IMPRESSION: Stable exam. No acute findings in the abdomen or pelvis.
Specifically, no findings to explain the patient's history of
abdominal pain.

## 2018-11-19 MED ORDER — SODIUM CHLORIDE (PF) 0.9 % IJ SOLN
INTRAMUSCULAR | Status: AC
  Filled 2018-11-19: qty 50

## 2018-11-19 MED ORDER — IOHEXOL 300 MG/ML  SOLN
100.0000 mL | Freq: Once | INTRAMUSCULAR | Status: AC | PRN
  Administered 2018-11-19: 100 mL via INTRAVENOUS

## 2019-05-10 ENCOUNTER — Other Ambulatory Visit: Payer: Self-pay

## 2019-05-10 ENCOUNTER — Encounter: Payer: Self-pay | Admitting: Podiatry

## 2019-05-10 ENCOUNTER — Ambulatory Visit: Payer: BC Managed Care – PPO

## 2019-05-10 ENCOUNTER — Ambulatory Visit: Payer: BC Managed Care – PPO | Admitting: Podiatry

## 2019-05-10 DIAGNOSIS — M722 Plantar fascial fibromatosis: Secondary | ICD-10-CM

## 2019-05-10 NOTE — Patient Instructions (Signed)
Pre-Operative Instructions  Congratulations, you have decided to take an important step towards improving your quality of life.  You can be assured that the doctors and staff at [facility] will be with you every step of the way.  Here are some important things you should know:  1. Plan to be at the surgery center/hospital at least 1 (one) hour prior to your scheduled time, unless otherwise directed by the surgical center/hospital staff.  You must have a responsible adult accompany you, remain during the surgery and drive you home.  Make sure you have directions to the surgical center/hospital to ensure you arrive on time. 2. If you are having surgery at [facility] or [facility], you will need a copy of your medical history and physical form from your family physician within one month prior to the date of surgery. We will give you a form for your primary physician to complete.  3. We make every effort to accommodate the date you request for surgery.  However, there are times where surgery dates or times have to be moved.  We will contact you as soon as possible if a change in schedule is required.   4. No aspirin/ibuprofen for one week before surgery.  If you are on aspirin, any non-steroidal anti-inflammatory medications (Mobic, Aleve, Ibuprofen) should not be taken seven (7) days prior to your surgery.  You make take Tylenol for pain prior to surgery.  5. Medications - If you are taking daily heart and blood pressure medications, seizure, reflux, allergy, asthma, anxiety, pain or diabetes medications, make sure you notify the surgery center/hospital before the day of surgery so they can tell you which medications you should take or avoid the day of surgery. 6. No food or drink after midnight the night before surgery unless directed otherwise by surgical center/hospital staff. 7. No alcoholic beverages 24-hours prior to surgery.  No smoking 24-hours prior or 24-hours after  surgery. 8. Wear loose pants or shorts. They should be loose enough to fit over bandages, boots, and casts. 9. Don't wear slip-on shoes. Sneakers are preferred. 10. Bring your boot with you to the surgery center/hospital.  Also bring crutches or a walker if your physician has prescribed it for you.  If you do not have this equipment, it will be provided for you after surgery. 11. If you have not been contacted by the surgery center/hospital by the day before your surgery, call to confirm the date and time of your surgery. 12. Leave-time from work may vary depending on the type of surgery you have.  Appropriate arrangements should be made prior to surgery with your employer. 13. Prescriptions will be provided immediately following surgery by your doctor.  Fill these as soon as possible after surgery and take the medication as directed. Pain medications will not be refilled on weekends and must be approved by the doctor. 14. Remove nail polish on the operative foot and avoid getting pedicures prior to surgery. 15. Wash the night before surgery.  The night before surgery wash the foot and leg well with water and the antibacterial soap provided. Be sure to pay special attention to beneath the toenails and in between the toes.  Wash for at least three (3) minutes. Rinse thoroughly with water and dry well with a towel.  Perform this wash unless told not to do so by your physician.  Enclosed: 1 Ice pack (please put in freezer the night before surgery)   1 Hibiclens skin cleaner     Pre-op instructions  If you have any questions regarding the instructions, please do not hesitate to call our office.  [place]: [address], [place], [place] [address] -- [phone]  [place]: [address] [place], [place] [address] -- [phone]  [place]: [address], , [place] [address] -- [phone]   Website: [email or web address] 

## 2019-05-11 ENCOUNTER — Telehealth: Payer: Self-pay | Admitting: *Deleted

## 2019-05-11 NOTE — Telephone Encounter (Signed)
"  I saw Dr. Amalia Hailey yesterday in the [place] office.  He told me to give you a call today to schedule my surgery.  He mentioned doing it next week."  He can do it on May 18, 2019.  "Okay great, I wanted to have it done by the end of the year.  What time will I need to get there?"  Someone from the surgical center will give you a call a day or two prior to your surgery date and will you your arrival time.  You need to go online and register with the surgical center via their online portal.  The instructions on how to do that are in the brochure that we gave you.  "Okay, I'll go online and take care of that now.  That was easy, thank you for your help."

## 2019-05-13 ENCOUNTER — Telehealth: Payer: Self-pay | Admitting: *Deleted

## 2019-05-13 NOTE — Telephone Encounter (Signed)
DOS 05/18/2019 ENDOSCOPIC PLANTAR FASCIOTOMY B/L FOOT - 21115  BCBS: Eligibility Date - 05/26/2018 - 05/25/9998   In-Network    Max Per Benefit Period Year-to-Date Remaining  CoInsurance  20%    Deductible  $1250.00 $0.00  Out-Of-Pocket  $4890.00 $2287.33   AMBULATORY SURGERY  In Network  [employer] Coinsurance  Not Applicable  52% per [place]

## 2019-05-13 NOTE — Progress Notes (Signed)
   Subjective: 43 y.o. female presenting today as a new patient, referred by Dr. Gershon Mussel, with a chief complaint of sharp, stabbing pain noted to the bilateral heels that began about one year ago. She reports associated numbness and tingling. She states the pain is constant and extends from the heels to the forefoot. She has received injections, worn CAM boots and fascial braces, taken Mobic, rested, iced, worn arch supports and has been stretching for treatment. Patient is here for further evaluation and treatment.    Past Medical History:  Diagnosis Date  . Amenorrhea   . Anxiety   . Depression   . Headache   . 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  Dermatology: Skin is warm, dry and supple bilateral lower extremities. Negative for open lesions or macerations bilateral.   Vascular: Dorsalis Pedis and Posterior Tibial pulses palpable bilateral.  Capillary fill time is immediate to all digits.  Neurological: Epicritic and protective threshold intact bilateral.   Musculoskeletal: Tenderness to palpation to the plantar aspect of the bilateral heels along the plantar fascia. All other joints range of motion within normal limits bilateral. Strength 5/5 in all groups bilateral.   Radiographic exam: Normal osseous mineralization. Joint spaces preserved. No fracture/dislocation/boney destruction. No other soft tissue abnormalities or radiopaque foreign bodies.   Assessment: 1. plantar fasciitis bilateral feet  Plan of Care:  1. Patient evaluated. Xrays reviewed.   2. Today we discussed the conservative versus surgical management of the presenting pathology. The patient opts for surgical management. All possible complications and details of the procedure were explained. All patient questions were answered. No guarantees were expressed or implied. 3. Authorization for surgery was initiated today. Surgery  will consist of EPF bilateral.  4. Post op shoes dispensed bilaterally.  5. Return to clinic one week post op.   Needs to have surgery during Christmas break. [employer] Pharmac[employer], hospital.    Edrick Kins, DPM [facility]  Dr. Edrick Kins, DPM    [address], [place] [address]                Office [phone]  Fax [phone]

## 2019-05-16 ENCOUNTER — Ambulatory Visit: Payer: BC Managed Care – PPO | Admitting: Podiatry

## 2019-05-18 ENCOUNTER — Other Ambulatory Visit: Payer: Self-pay | Admitting: Podiatry

## 2019-05-18 DIAGNOSIS — M722 Plantar fascial fibromatosis: Secondary | ICD-10-CM

## 2019-05-18 MED ORDER — OXYCODONE-ACETAMINOPHEN 5-325 MG PO TABS: 1.0000 | ORAL_TABLET | Freq: Four times a day (QID) | ORAL | 0 refills | Status: DC | PRN

## 2019-05-18 NOTE — Progress Notes (Signed)
PRN postop 

## 2019-05-24 ENCOUNTER — Ambulatory Visit (INDEPENDENT_AMBULATORY_CARE_PROVIDER_SITE_OTHER): Payer: BC Managed Care – PPO | Admitting: Podiatry

## 2019-05-24 ENCOUNTER — Other Ambulatory Visit: Payer: Self-pay

## 2019-05-24 ENCOUNTER — Encounter: Payer: Self-pay | Admitting: Podiatry

## 2019-05-24 VITALS — BP 122/85 | HR 85 | Temp 99.0°F

## 2019-05-24 DIAGNOSIS — Z9889 Other specified postprocedural states: Secondary | ICD-10-CM

## 2019-05-24 DIAGNOSIS — M722 Plantar fascial fibromatosis: Secondary | ICD-10-CM

## 2019-05-26 ENCOUNTER — Encounter: Payer: Self-pay | Admitting: Podiatry

## 2019-05-30 NOTE — Progress Notes (Signed)
   Subjective:  Patient presents today status post EPF bilateral. DOS: 05/18/2019. She reports some continued pain but states it is better than before. She reports associated swelling, left greater than right. Standing from a seated position increases the pain. She has been using the post op shoes as directed. Patient is here for further evaluation and treatment.    Past Medical History:  Diagnosis Date  . Amenorrhea   . Anxiety   . Depression   . Headache   . 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  Skin incisions appear to be well coapted with sutures and staples intact. No sign of infectious process noted. No dehiscence. No active bleeding noted. Moderate edema noted to the surgical extremity.  Assessment: 1. s/p EPF bilateral. DOS: 05/18/2019   Plan of Care:  1. Patient was evaluated.  2. Dressing changed.  3. Continue weightbearing in post op shoes.  4. Return to clinic in one week for suture removal.    Felecia Shelling, DPM [facility]  Dr. Felecia Shelling, DPM    [address]                                        [place], [place] [address]                Office [phone]  Fax [phone]

## 2019-05-31 ENCOUNTER — Ambulatory Visit (INDEPENDENT_AMBULATORY_CARE_PROVIDER_SITE_OTHER): Payer: BC Managed Care – PPO | Admitting: Podiatry

## 2019-05-31 ENCOUNTER — Other Ambulatory Visit: Payer: Self-pay

## 2019-05-31 DIAGNOSIS — M722 Plantar fascial fibromatosis: Secondary | ICD-10-CM

## 2019-05-31 DIAGNOSIS — Z9889 Other specified postprocedural states: Secondary | ICD-10-CM

## 2019-06-03 NOTE — Progress Notes (Signed)
   Subjective:  Patient presents today status post EPF bilateral. DOS: 05/18/2019. She reports some cramping pain to the lateral aspects of the feet that she believes is caused by the post op shoes. She denies any other complaints of modifying factors. Patient is here for further evaluation and treatment.    Past Medical History:  Diagnosis Date  . Amenorrhea   . Anxiety   . Depression   . Headache   . 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  Skin incisions appear to be well coapted with sutures and staples intact. No sign of infectious process noted. No dehiscence. No active bleeding noted. Moderate edema noted to the surgical extremity.  Assessment: 1. s/p EPF bilateral. DOS: 05/18/2019   Plan of Care:  1. Patient was evaluated.  2. Sutures removed.  3. Transition out of post op shoes into [employer].  4. Return to clinic in 2 weeks.    Felecia Shelling, DPM [facility]  Dr. Felecia Shelling, DPM    [address]                                        [place], [place] [address]                Office [phone]  Fax [phone]

## 2019-06-06 ENCOUNTER — Encounter: Payer: Self-pay | Admitting: Podiatry

## 2019-06-13 ENCOUNTER — Other Ambulatory Visit: Payer: Self-pay

## 2019-06-13 ENCOUNTER — Ambulatory Visit (INDEPENDENT_AMBULATORY_CARE_PROVIDER_SITE_OTHER): Payer: BC Managed Care – PPO | Admitting: Podiatry

## 2019-06-13 DIAGNOSIS — Z9889 Other specified postprocedural states: Secondary | ICD-10-CM

## 2019-06-13 DIAGNOSIS — M722 Plantar fascial fibromatosis: Secondary | ICD-10-CM

## 2019-06-14 ENCOUNTER — Encounter: Payer: BC Managed Care – PPO | Admitting: Podiatry

## 2019-06-16 NOTE — Progress Notes (Signed)
   Subjective:  Patient presents today status post EPF bilateral. DOS: 05/18/2019. She reports pain when wearing tennis shoes. Not wearing shoes and being barefoot helps alleviate the pain. She denies any recent swelling. She has been using the compression anklets as directed. Patient is here for further evaluation and treatment.    Past Medical History:  Diagnosis Date  . Amenorrhea   . Anxiety   . Depression   . Headache   . 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  Skin incisions appear to be well coapted. No sign of infectious process noted. No dehiscence. No active bleeding noted. Moderate edema noted to the surgical extremity.  Assessment: 1. s/p EPF bilateral. DOS: 05/18/2019   Plan of Care:  1. Patient was evaluated.  2. Continue wearing good shoe gear.  3. Continue using compression anklets.  4. Return to clinic in 6 weeks.     Felecia Shelling, DPM [facility]  Dr. Felecia Shelling, DPM    [address]                                        [place], [place] [address]                Office [phone]  Fax [phone]

## 2019-08-01 ENCOUNTER — Ambulatory Visit (INDEPENDENT_AMBULATORY_CARE_PROVIDER_SITE_OTHER): Payer: BC Managed Care – PPO | Admitting: Podiatry

## 2019-08-01 ENCOUNTER — Other Ambulatory Visit: Payer: Self-pay

## 2019-08-01 VITALS — Temp 96.9°F

## 2019-08-01 DIAGNOSIS — Z9889 Other specified postprocedural states: Secondary | ICD-10-CM

## 2019-08-01 DIAGNOSIS — M722 Plantar fascial fibromatosis: Secondary | ICD-10-CM

## 2019-08-01 MED ORDER — MELOXICAM 15 MG PO TABS: 15.0000 mg | ORAL_TABLET | Freq: Every day | ORAL | 1 refills | Status: DC

## 2019-08-04 NOTE — Progress Notes (Signed)
   Subjective:  Patient presents today status post EPF bilateral. DOS: 05/18/2019. She states she is doing well overall. She states some days are better than others and the left foot pain is worse than the right. She has been using the compression anklets consistently as directed. Patient is here for further evaluation and treatment.    Past Medical History:  Diagnosis Date  . Amenorrhea   . Anxiety   . Depression   . Headache   . 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  Skin incisions appear to be well coapted. No sign of infectious process noted. No dehiscence. No active bleeding noted. Moderate edema noted to the surgical extremity.  Assessment: 1. s/p EPF bilateral. DOS: 05/18/2019   Plan of Care:  1. Patient was evaluated.  2. Recommended daily stretching exercises.  3. Recommended good shoe gear.  4. Return to clinic in 4 weeks.      Felecia Shelling, DPM [facility]  Dr. Felecia Shelling, DPM    [address]                                        [place], [place] [address]                Office [phone]  Fax [phone]

## 2020-04-09 ENCOUNTER — Other Ambulatory Visit: Payer: Self-pay

## 2020-04-09 ENCOUNTER — Encounter: Payer: Self-pay | Admitting: Podiatry

## 2020-04-09 ENCOUNTER — Ambulatory Visit (INDEPENDENT_AMBULATORY_CARE_PROVIDER_SITE_OTHER): Payer: BC Managed Care – PPO

## 2020-04-09 ENCOUNTER — Ambulatory Visit: Payer: BC Managed Care – PPO | Admitting: Podiatry

## 2020-04-09 DIAGNOSIS — M7662 Achilles tendinitis, left leg: Secondary | ICD-10-CM

## 2020-04-09 DIAGNOSIS — M778 Other enthesopathies, not elsewhere classified: Secondary | ICD-10-CM

## 2020-04-09 DIAGNOSIS — M7672 Peroneal tendinitis, left leg: Secondary | ICD-10-CM

## 2020-04-09 DIAGNOSIS — M722 Plantar fascial fibromatosis: Secondary | ICD-10-CM

## 2020-04-10 MED ORDER — MELOXICAM 15 MG PO TABS: 15.0000 mg | ORAL_TABLET | Freq: Every day | ORAL | 1 refills | Status: DC

## 2020-04-10 MED ORDER — METHYLPREDNISOLONE 4 MG PO TBPK: ORAL_TABLET | ORAL | 0 refills | Status: DC

## 2020-04-10 NOTE — Progress Notes (Signed)
   HPI: 44 y.o. female presenting today for new complaint regarding left foot and ankle pain. Patient states that she experiences swelling with tightness on top of the foot that radiates up to the back of the leg. She also complains of pain with compression of the forefoot. Pain also to the lateral aspect of the ankle. She states she has been experiencing this pain for the past few months however on Wednesday the child stepped on her foot which exacerbated her forefoot pain. She has not done anything for treatment. She presents for further treatment and evaluation  Past Medical History:  Diagnosis Date  . Amenorrhea   . Anxiety   . Depression   . Headache   . 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  Dermatology: Skin is warm, dry and supple bilateral lower extremities. Negative for open lesions or macerations.  Vascular: Palpable pedal pulses bilaterally. No edema or erythema noted. Capillary refill within normal limits. Patient's pain and symptoms appear to be more musculoskeletal and not associated to any vascular compromise.  Neurological: Epicritic and protective threshold grossly intact bilaterally. Patient denies pins-and-needles sensation with paresthesia. Again, patient symptoms are more musculoskeletal versus neurological or vascular.  Musculoskeletal Exam: Range of motion within normal limits to all pedal and ankle joints bilateral. Muscle strength 5/5 in all groups bilateral. There is diffuse pain throughout the Achilles tendon, forefoot, lateral ankle along the peroneal tendons, and midfoot. There is not a very focal pinpoint area of pain. The pain is very diffuse throughout the left foot and ankle.   Radiographic exam: Normal osseous mineralization. Joint spaces preserved. No fractures identified. Otherwise normal exam  Assessment: 1. Diffuse left lower extremity pain-Achilles tendon, peroneal  tendons, midfoot, forefoot, fifth metatarsal   Plan of Care:  1. Patient evaluated. X-Rays reviewed.  2. Prescription for Medrol Dosepak 3. Prescription for meloxicam 15 mg daily to begin after completion of the Dosepak 4. Cam boot dispensed. Weightbearing as tolerated x3 weeks 5. Return to clinic in 3 weeks to initiate possible physical therapy      Felecia Shelling, DPM [facility]  Dr. Felecia Shelling, DPM    [address] [place], [place] [address]                Office [phone]  Fax [phone]

## 2020-04-30 ENCOUNTER — Ambulatory Visit: Payer: BC Managed Care – PPO | Admitting: Podiatry

## 2020-05-14 ENCOUNTER — Ambulatory Visit: Payer: BC Managed Care – PPO | Admitting: Podiatry

## 2020-05-14 ENCOUNTER — Other Ambulatory Visit: Payer: Self-pay

## 2020-05-14 DIAGNOSIS — M778 Other enthesopathies, not elsewhere classified: Secondary | ICD-10-CM

## 2020-05-14 MED ORDER — BETAMETHASONE SOD PHOS & ACET 6 (3-3) MG/ML IJ SUSP
3.0000 mg | Freq: Once | INTRAMUSCULAR | Status: AC
  Administered 2020-05-14: 3 mg via INTRAMUSCULAR

## 2020-05-14 NOTE — Progress Notes (Signed)
   HPI: 44 y.o. female presenting today for new complaint regarding left foot and ankle pain. Patient states that she experiences swelling with tightness on top of the foot that radiates up to the back of the leg. She also complains of pain with compression of the forefoot. Pain also to the lateral aspect of the ankle. She states she has been experiencing this pain for the past few months however on Wednesday the child stepped on her foot which exacerbated her forefoot pain. She has not done anything for treatment. She presents for further treatment and evaluation  Past Medical History:  Diagnosis Date  . Amenorrhea   . Anxiety   . Depression   . Headache   . 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  Dermatology: Skin is warm, dry and supple bilateral lower extremities. Negative for open lesions or macerations.  Vascular: Palpable pedal pulses bilaterally. No edema or erythema noted. Capillary refill within normal limits. Patient's pain and symptoms appear to be more musculoskeletal and not associated to any vascular compromise.  Neurological: Epicritic and protective threshold grossly intact bilaterally. Patient denies pins-and-needles sensation with paresthesia. Again, patient symptoms are more musculoskeletal versus neurological or vascular.  Musculoskeletal Exam: Range of motion within normal limits to all pedal and ankle joints bilateral. Muscle strength 5/5 in all groups bilateral. There is diffuse pain throughout the Achilles tendon, forefoot, lateral ankle along the peroneal tendons, and midfoot. There is not a very focal pinpoint area of pain. The pain is very diffuse throughout the left foot and ankle.    Assessment: 1. Diffuse left lower extremity pain-Achilles tendon, peroneal tendons, midfoot, forefoot, fifth metatarsal   Plan of Care:  1. Patient evaluated. X-Rays reviewed.  2.  Injection of 0.5 cc  Celestone Soluspan injected into the midtarsal joint left foot 3.  Continue meloxicam 15 mg daily as needed  4.  Discontinue cam boot.  Ankle brace dispensed.  Wear daily 5.  Order placed for physical therapy at [facility]. 6.  Return to clinic in 6 weeks      Felecia Shelling, DPM [facility]  Dr. Felecia Shelling, DPM    [address] [place], [place] [address]                Office [phone]  Fax [phone]

## 2020-08-22 ENCOUNTER — Other Ambulatory Visit: Payer: Self-pay | Admitting: Physician Assistant

## 2020-08-22 DIAGNOSIS — R1084 Generalized abdominal pain: Secondary | ICD-10-CM

## 2020-09-12 ENCOUNTER — Other Ambulatory Visit: Payer: Self-pay | Admitting: Physician Assistant

## 2020-09-12 DIAGNOSIS — R1013 Epigastric pain: Secondary | ICD-10-CM

## 2020-09-12 DIAGNOSIS — R1032 Left lower quadrant pain: Secondary | ICD-10-CM

## 2020-09-12 DIAGNOSIS — R1084 Generalized abdominal pain: Secondary | ICD-10-CM

## 2020-09-17 ENCOUNTER — Ambulatory Visit
Admission: RE | Admit: 2020-09-17 | Discharge: 2020-09-17 | Disposition: A | Discharge status: Home / Self Care | Payer: Medicaid Other | Source: Ambulatory Visit | Attending: Physician Assistant | Admitting: Physician Assistant

## 2020-09-17 ENCOUNTER — Other Ambulatory Visit: Payer: Self-pay

## 2020-09-17 DIAGNOSIS — R1032 Left lower quadrant pain: Secondary | ICD-10-CM

## 2020-09-17 DIAGNOSIS — R1013 Epigastric pain: Secondary | ICD-10-CM

## 2020-09-17 DIAGNOSIS — R1084 Generalized abdominal pain: Secondary | ICD-10-CM

## 2020-09-17 IMAGING — US US ABDOMEN COMPLETE
1 series · 14 of 25 positions shown · non-contrast
Comparison: CT abdomen pelvis [DATE]

CLINICAL DATA: Generalized abdominal pain x2 months

EXAM:
ABDOMEN ULTRASOUND COMPLETE

[Series 1: us abdomen complete · 0.19mm/px · 14 of 78 slices shown]
[im 1/78]
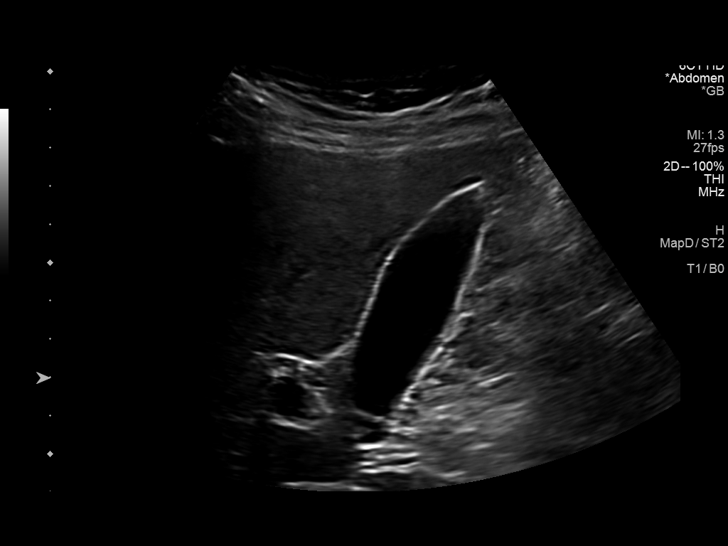
[im 7/78]
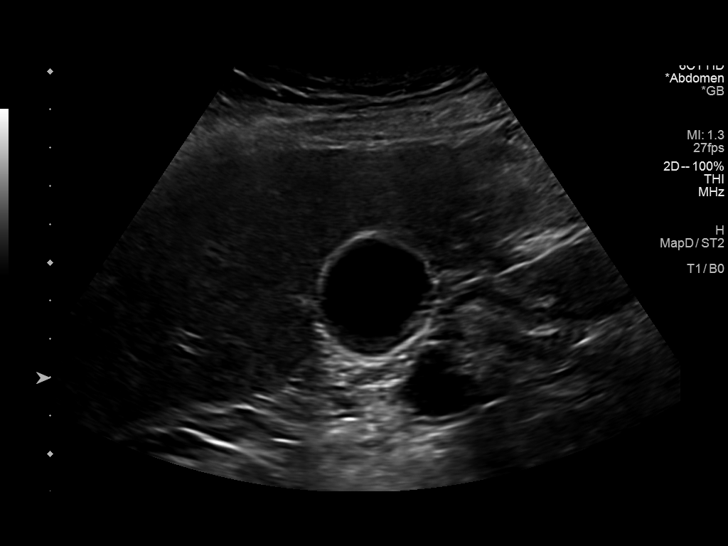
[im 13/78]
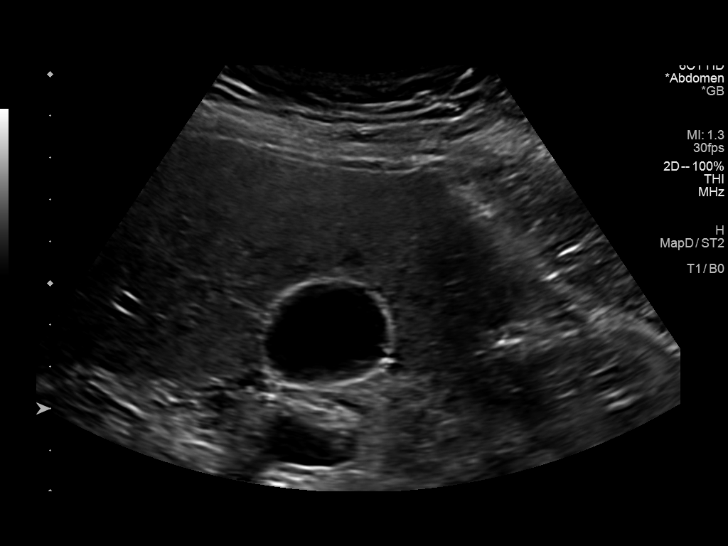
[im 20/78]
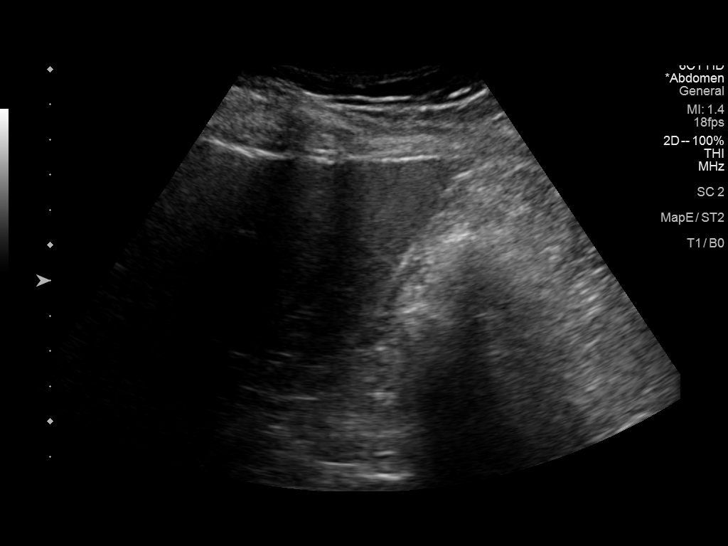
[im 26/78]
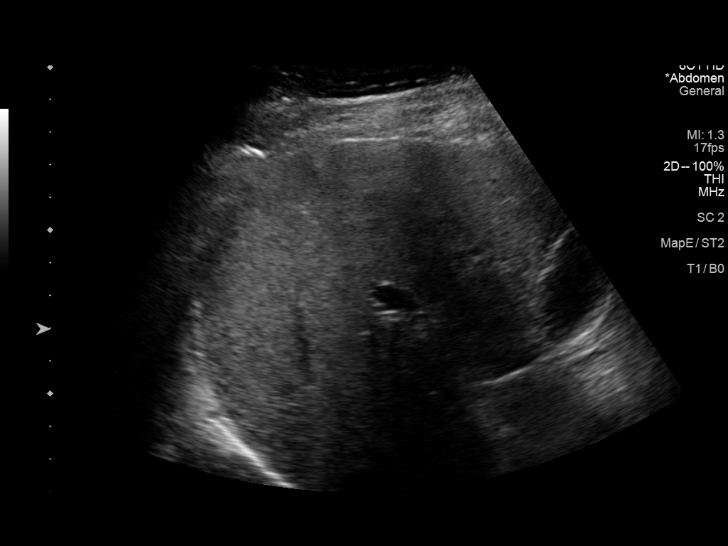
[im 29/78]
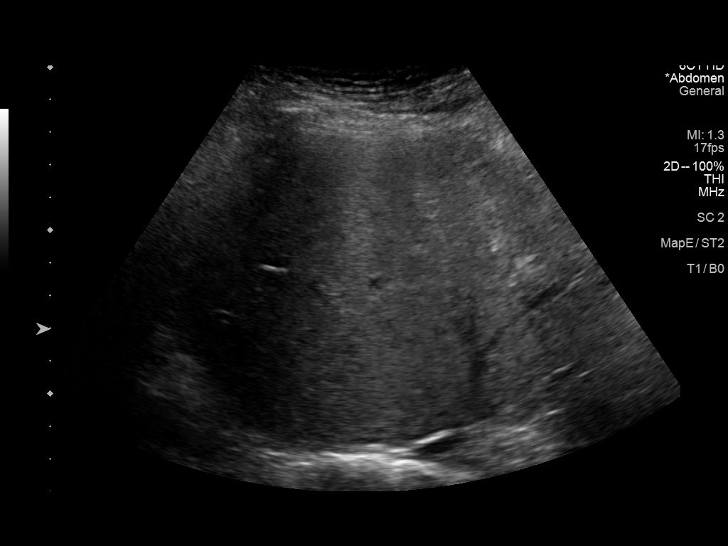
[im 36/78]
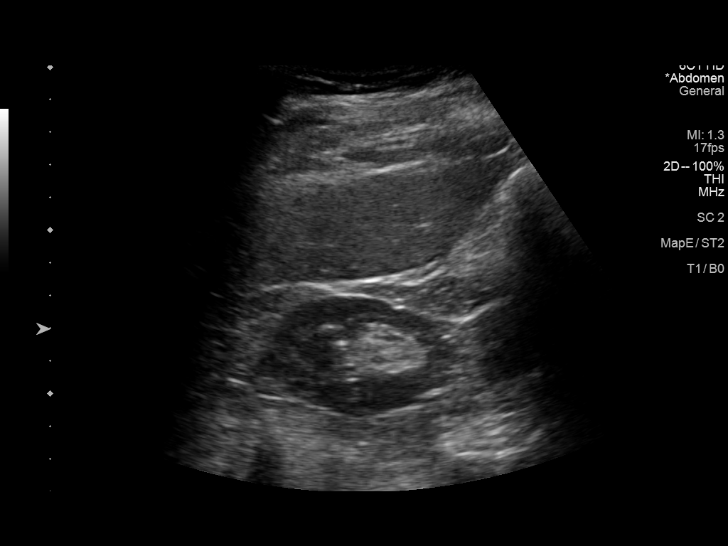
[im 42/78]
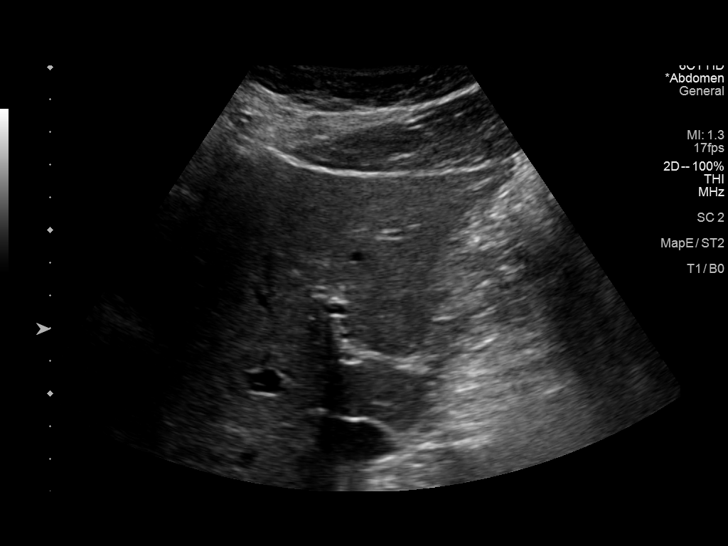
[im 49/78]
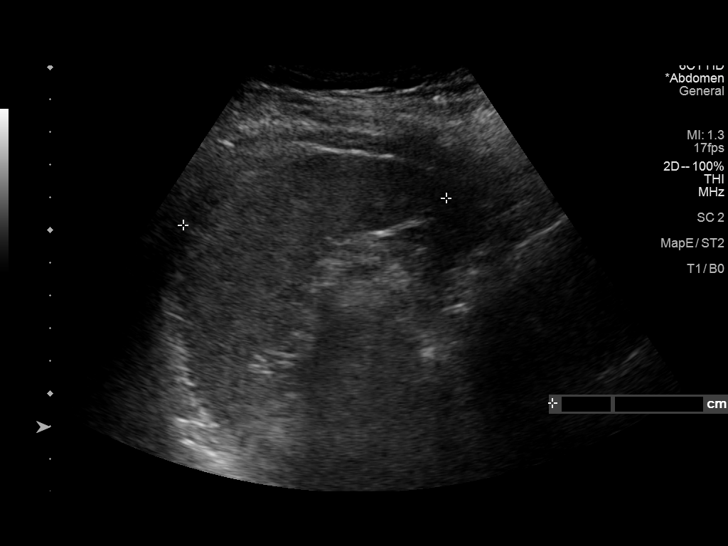
[im 52/78]
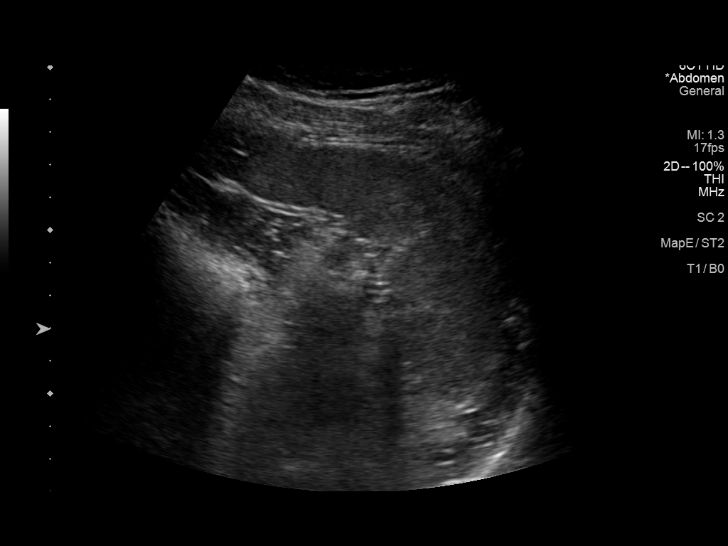
[im 58/78]
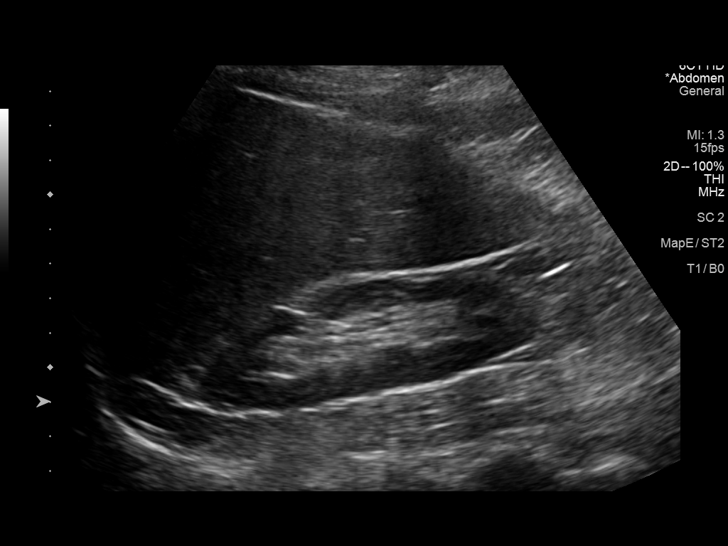
[im 65/78]
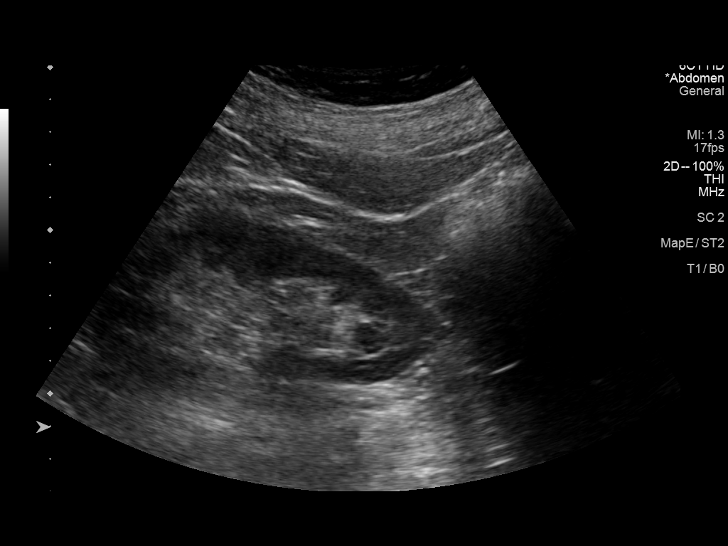
[im 71/78]
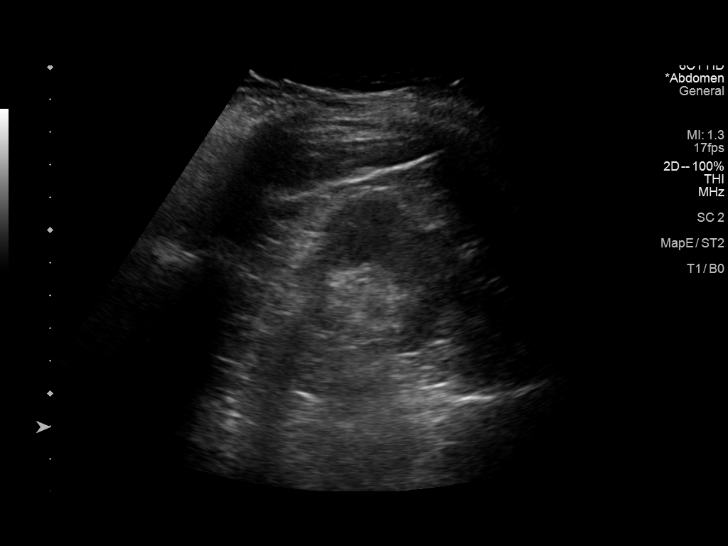
[im 78/78]
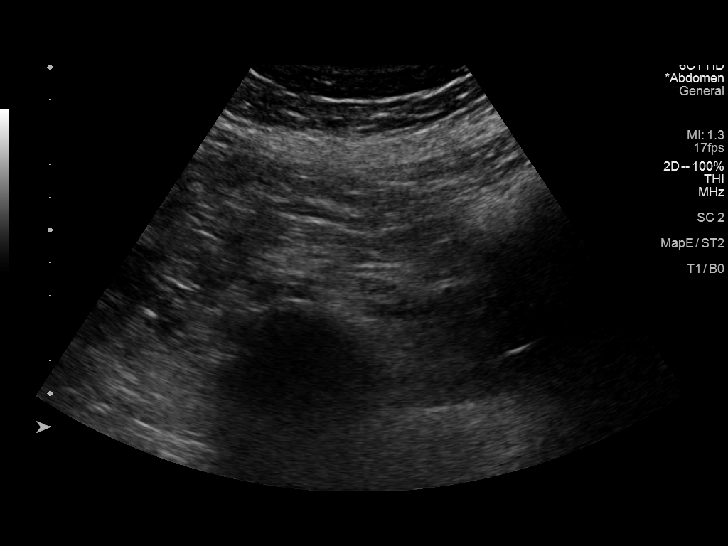

[14 of 25 positions shown; findings below may reference images not displayed]

FINDINGS: Gallbladder: No gallstones or wall thickening visualized. No
sonographic Murphy sign noted by sonographer.

Common bile duct: Diameter: 5 mm

Liver: No focal lesion identified. Heterogeneously increased hepatic
parenchymal echogenicity. Portal vein is patent on color Doppler
imaging with normal direction of blood flow towards the liver.

IVC: No abnormality visualized.

Pancreas: Visualized portion unremarkable.

Spleen: Size and appearance within normal limits.

Right Kidney: Length: 10.5 cm. Echogenicity within normal limits. No
mass or hydronephrosis visualized.

Left Kidney: Length: 10.8 cm. Echogenicity within normal limits. No
mass or hydronephrosis visualized.

Abdominal aorta: No aneurysm visualized.

Other findings: None.
IMPRESSION: 1. The echogenicity of the liver is increased. This is a nonspecific
finding but is most commonly seen with fatty infiltration of the
liver. There are no obvious focal liver lesions.

## 2020-09-27 ENCOUNTER — Observation Stay (HOSPITAL_COMMUNITY)
Admission: EM | Admit: 2020-09-27 | Discharge: 2020-09-28 | Disposition: A | Discharge status: Home / Self Care | Payer: BC Managed Care – PPO | Attending: Internal Medicine | Admitting: Internal Medicine

## 2020-09-27 ENCOUNTER — Emergency Department (HOSPITAL_COMMUNITY)
Admission: EM | Admit: 2020-09-27 | Discharge: 2020-09-27 | Disposition: A | Discharge status: Home / Self Care | Payer: BC Managed Care – PPO | Source: Home / Self Care | Attending: Emergency Medicine | Admitting: Emergency Medicine

## 2020-09-27 ENCOUNTER — Emergency Department (HOSPITAL_COMMUNITY): Payer: BC Managed Care – PPO

## 2020-09-27 ENCOUNTER — Encounter (HOSPITAL_COMMUNITY): Payer: Self-pay | Admitting: Emergency Medicine

## 2020-09-27 ENCOUNTER — Other Ambulatory Visit: Payer: Self-pay

## 2020-09-27 DIAGNOSIS — Z5321 Procedure and treatment not carried out due to patient leaving prior to being seen by health care provider: Secondary | ICD-10-CM | POA: Insufficient documentation

## 2020-09-27 DIAGNOSIS — R739 Hyperglycemia, unspecified: Secondary | ICD-10-CM | POA: Diagnosis not present

## 2020-09-27 DIAGNOSIS — J385 Laryngeal spasm: Secondary | ICD-10-CM | POA: Diagnosis not present

## 2020-09-27 DIAGNOSIS — Z20822 Contact with and (suspected) exposure to covid-19: Secondary | ICD-10-CM | POA: Diagnosis not present

## 2020-09-27 DIAGNOSIS — E876 Hypokalemia: Secondary | ICD-10-CM | POA: Diagnosis not present

## 2020-09-27 DIAGNOSIS — R061 Stridor: Secondary | ICD-10-CM

## 2020-09-27 DIAGNOSIS — Z79899 Other long term (current) drug therapy: Secondary | ICD-10-CM | POA: Diagnosis not present

## 2020-09-27 DIAGNOSIS — E872 Acidosis: Secondary | ICD-10-CM | POA: Diagnosis not present

## 2020-09-27 DIAGNOSIS — R059 Cough, unspecified: Secondary | ICD-10-CM | POA: Diagnosis present

## 2020-09-27 DIAGNOSIS — J45909 Unspecified asthma, uncomplicated: Secondary | ICD-10-CM | POA: Insufficient documentation

## 2020-09-27 DIAGNOSIS — R0602 Shortness of breath: Secondary | ICD-10-CM | POA: Insufficient documentation

## 2020-09-27 DIAGNOSIS — D72829 Elevated white blood cell count, unspecified: Secondary | ICD-10-CM | POA: Diagnosis not present

## 2020-09-27 LAB — URINALYSIS, ROUTINE W REFLEX MICROSCOPIC
Bacteria, UA: NONE SEEN
Bilirubin Urine: NEGATIVE
Glucose, UA: NEGATIVE mg/dL
Ketones, ur: 5 mg/dL — AB
Leukocytes,Ua: NEGATIVE
Nitrite: NEGATIVE
Protein, ur: NEGATIVE mg/dL
Specific Gravity, Urine: 1.038 — ABNORMAL HIGH (ref 1.005–1.030)
pH: 5 (ref 5.0–8.0)

## 2020-09-27 LAB — CBC
HCT: 36.8 % (ref 36.0–46.0)
Hemoglobin: 12.3 g/dL (ref 12.0–15.0)
MCH: 31.1 pg (ref 26.0–34.0)
MCHC: 33.4 g/dL (ref 30.0–36.0)
MCV: 93.2 fL (ref 80.0–100.0)
Platelets: 287 10*3/uL (ref 150–400)
RBC: 3.95 MIL/uL (ref 3.87–5.11)
RDW: 12.7 % (ref 11.5–15.5)
WBC: 19.3 10*3/uL — ABNORMAL HIGH (ref 4.0–10.5)
nRBC: 0 % (ref 0.0–0.2)

## 2020-09-27 LAB — BASIC METABOLIC PANEL
Anion gap: 14 (ref 5–15)
BUN: 10 mg/dL (ref 6–20)
CO2: 15 mmol/L — ABNORMAL LOW (ref 22–32)
Calcium: 8.1 mg/dL — ABNORMAL LOW (ref 8.9–10.3)
Chloride: 110 mmol/L (ref 98–111)
Creatinine, Ser: 1.3 mg/dL — ABNORMAL HIGH (ref 0.44–1.00)
GFR, Estimated: 52 mL/min — ABNORMAL LOW (ref >60)
Glucose, Bld: 210 mg/dL — ABNORMAL HIGH (ref 70–99)
Potassium: 3 mmol/L — ABNORMAL LOW (ref 3.5–5.1)
Sodium: 139 mmol/L (ref 135–145)

## 2020-09-27 LAB — CBC WITH DIFFERENTIAL/PLATELET
Abs Immature Granulocytes: 0.2 10*3/uL — ABNORMAL HIGH (ref 0.00–0.07)
Basophils Absolute: 0.1 10*3/uL (ref 0.0–0.1)
Basophils Relative: 0 %
Eosinophils Absolute: 0 10*3/uL (ref 0.0–0.5)
Eosinophils Relative: 0 %
HCT: 37.7 % (ref 36.0–46.0)
Hemoglobin: 12.3 g/dL (ref 12.0–15.0)
Immature Granulocytes: 1 %
Lymphocytes Relative: 5 %
Lymphs Abs: 1.2 10*3/uL (ref 0.7–4.0)
MCH: 30.8 pg (ref 26.0–34.0)
MCHC: 32.6 g/dL (ref 30.0–36.0)
MCV: 94.3 fL (ref 80.0–100.0)
Monocytes Absolute: 0.3 10*3/uL (ref 0.1–1.0)
Monocytes Relative: 2 %
Neutro Abs: 21.4 10*3/uL — ABNORMAL HIGH (ref 1.7–7.7)
Neutrophils Relative %: 92 %
Platelets: 301 10*3/uL (ref 150–400)
RBC: 4 MIL/uL (ref 3.87–5.11)
RDW: 12.5 % (ref 11.5–15.5)
WBC: 23.2 10*3/uL — ABNORMAL HIGH (ref 4.0–10.5)
nRBC: 0 % (ref 0.0–0.2)

## 2020-09-27 LAB — I-STAT CHEM 8, ED
BUN: 8 mg/dL (ref 6–20)
Calcium, Ion: 1.06 mmol/L — ABNORMAL LOW (ref 1.15–1.40)
Chloride: 109 mmol/L (ref 98–111)
Creatinine, Ser: 0.9 mg/dL (ref 0.44–1.00)
Glucose, Bld: 210 mg/dL — ABNORMAL HIGH (ref 70–99)
HCT: 34 % — ABNORMAL LOW (ref 36.0–46.0)
Hemoglobin: 11.6 g/dL — ABNORMAL LOW (ref 12.0–15.0)
Potassium: 3 mmol/L — ABNORMAL LOW (ref 3.5–5.1)
Sodium: 142 mmol/L (ref 135–145)
TCO2: 16 mmol/L — ABNORMAL LOW (ref 22–32)

## 2020-09-27 LAB — CBG MONITORING, ED: Glucose-Capillary: 143 mg/dL — ABNORMAL HIGH (ref 70–99)

## 2020-09-27 LAB — RESP PANEL BY RT-PCR (FLU A&B, COVID) ARPGX2
Influenza A by PCR: NEGATIVE
Influenza B by PCR: NEGATIVE
SARS Coronavirus 2 by RT PCR: NEGATIVE

## 2020-09-27 LAB — LACTIC ACID, PLASMA
Lactic Acid, Venous: 5.8 mmol/L (ref 0.5–1.9)
Lactic Acid, Venous: 5.1 mmol/L (ref 0.5–1.9)

## 2020-09-27 IMAGING — CR DG CHEST 2V
2 series · 2 of 2 positions shown · non-contrast
Comparison: [DATE]

CLINICAL DATA: Shortness of breath.  Cough.

EXAM:
CHEST - 2 VIEW

[chest pa]
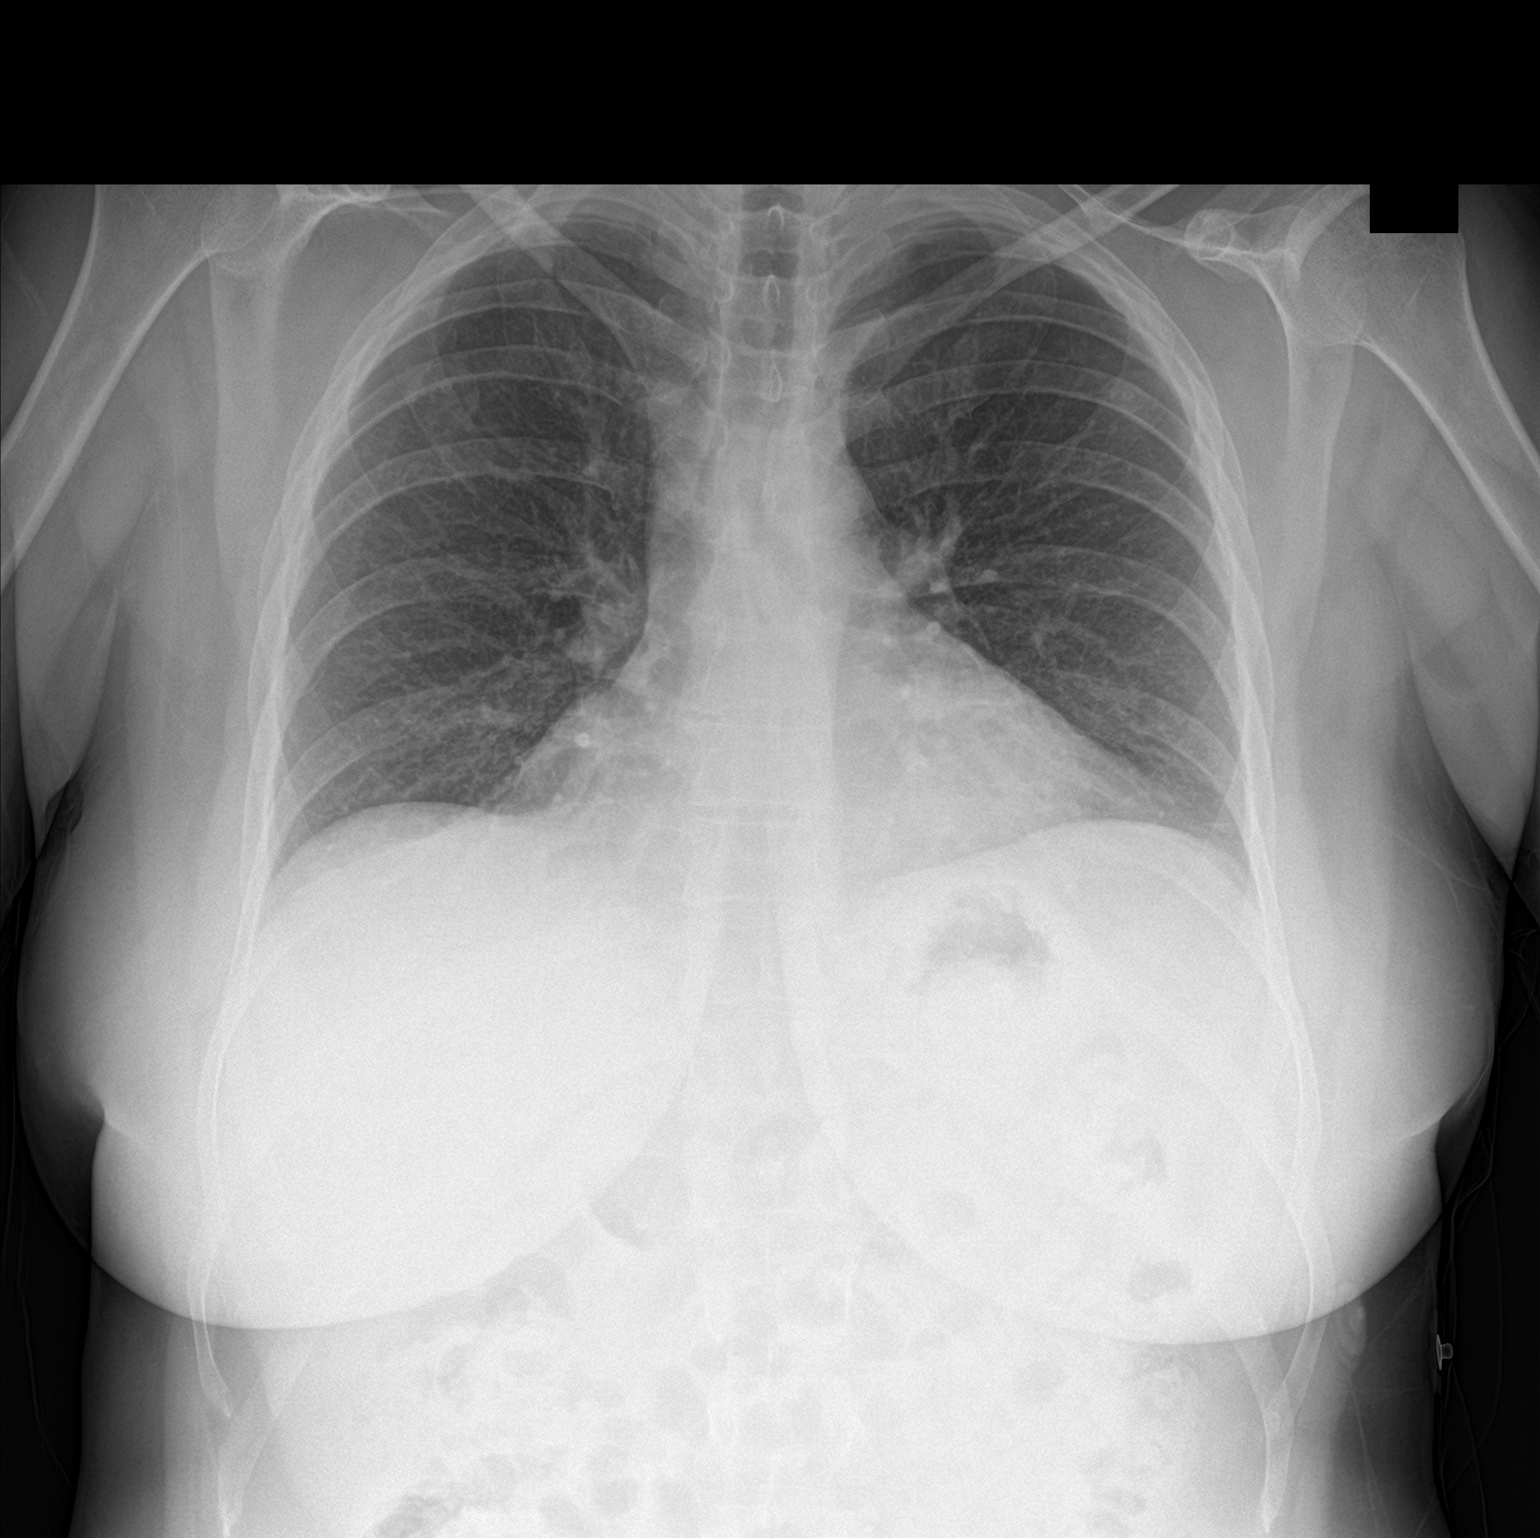

[chest lat]
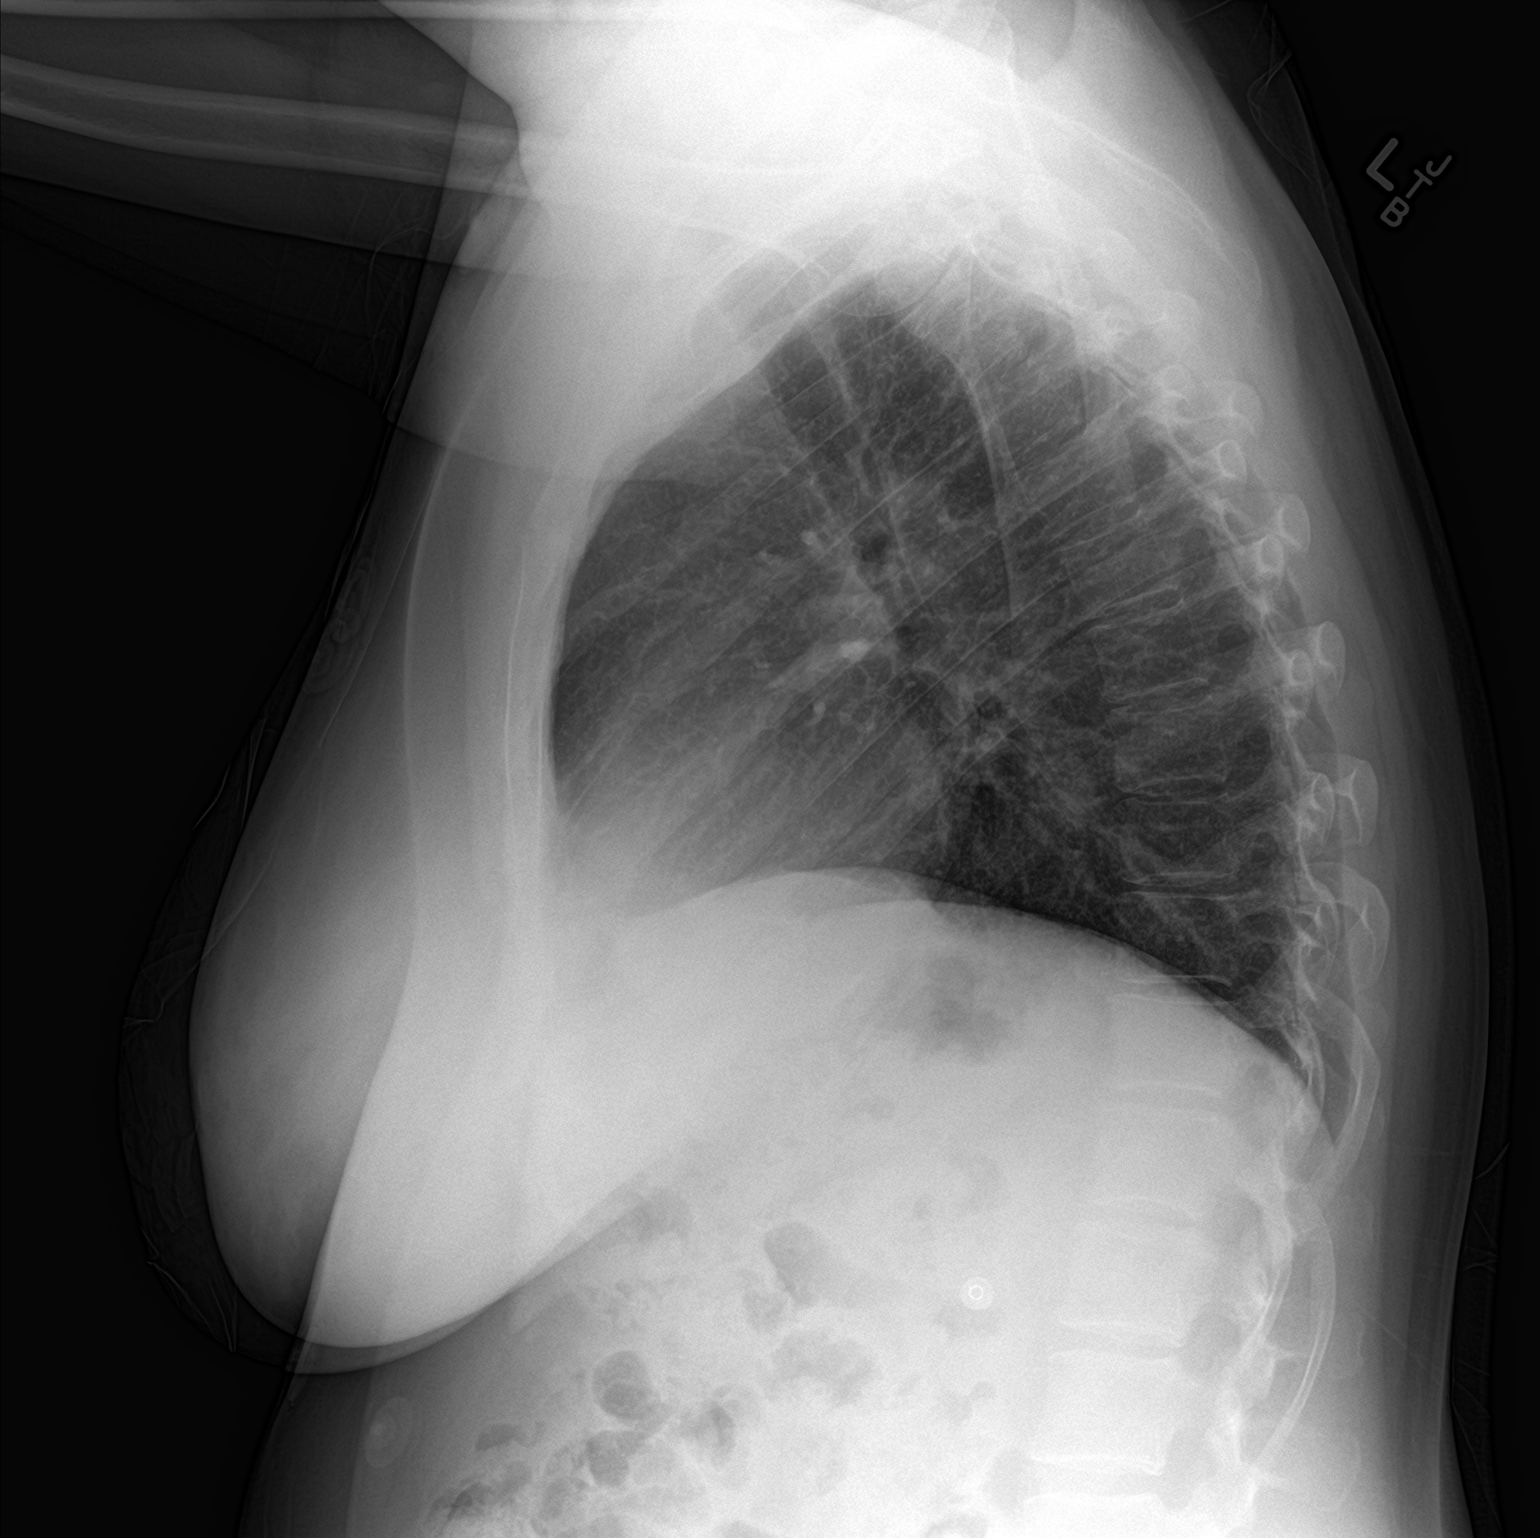

[2 of 2 positions shown; findings below may reference images not displayed]

FINDINGS: Poor inspiration. Normal sized heart. Clear lungs. Unremarkable
bones.
IMPRESSION: No active cardiopulmonary disease.

## 2020-09-27 IMAGING — DX DG NECK SOFT TISSUE
2 series · 2 of 2 positions shown · non-contrast
Comparison: None.

CLINICAL DATA: Dyspnea

EXAM:
NECK SOFT TISSUES - 1+ VIEW

[neck lat]
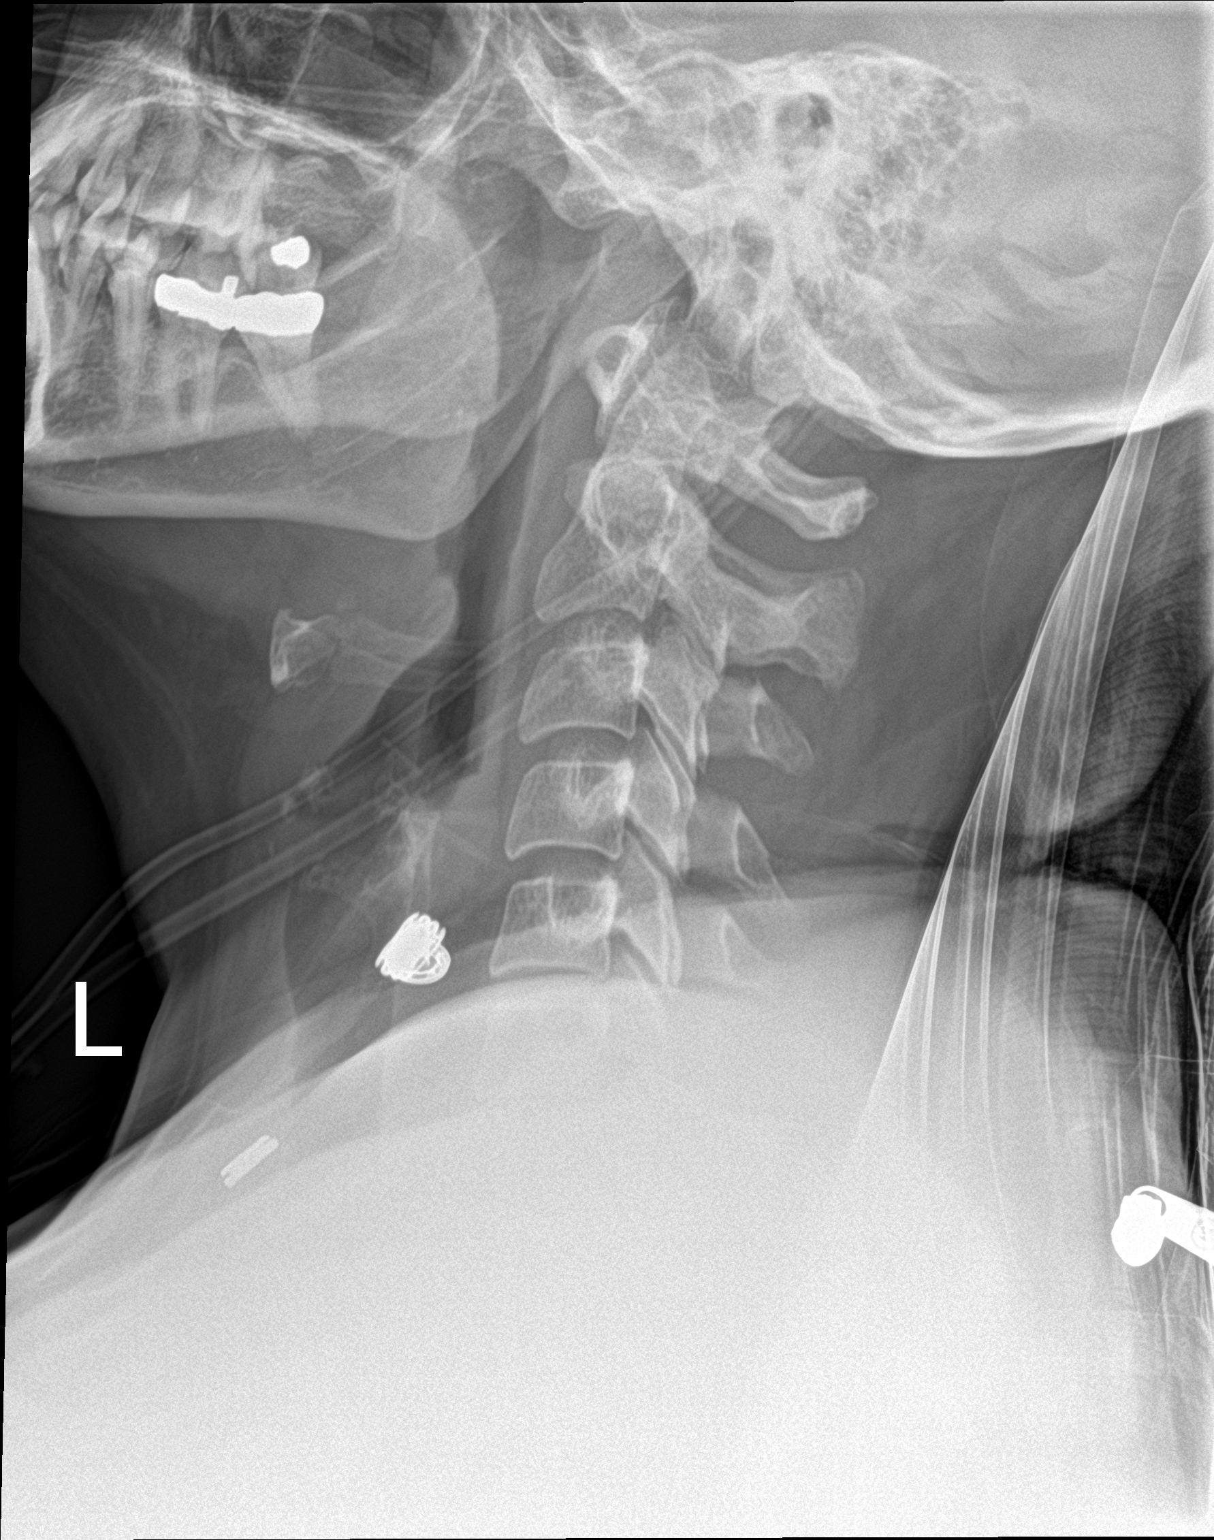

[neck ap]
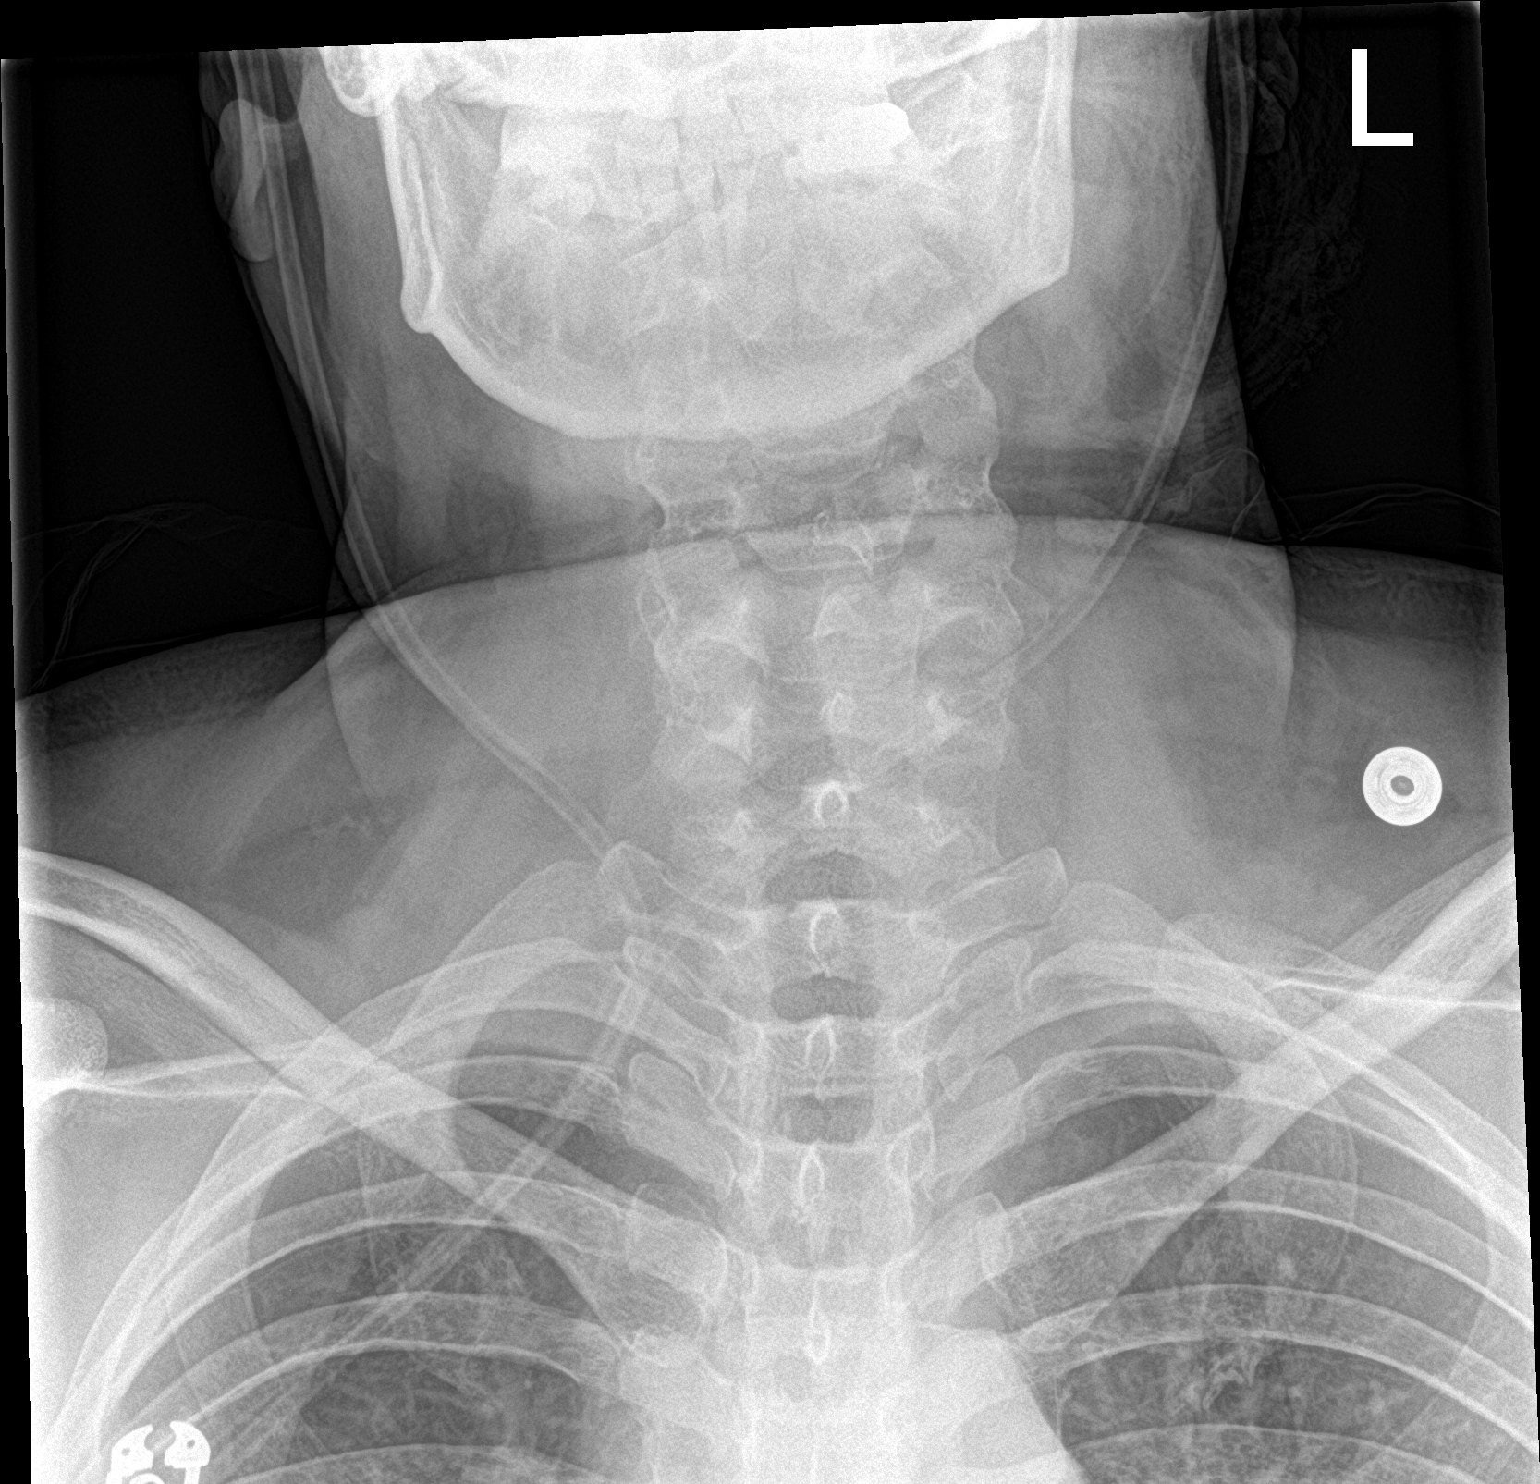

[2 of 2 positions shown; findings below may reference images not displayed]

FINDINGS: There is no evidence of retropharyngeal soft tissue swelling or
epiglottic enlargement. The cervical airway is unremarkable and no
radio-opaque foreign body identified.
IMPRESSION: Negative.

## 2020-09-27 IMAGING — CT CT NECK W/ CM
4 of 5 series · 16 of 33 positions shown, 18 images · IV contrast (APPLIED)
Comparison: None.

CLINICAL DATA: Shortness of breath, difficulty swallowing

EXAM:
CT NECK WITH CONTRAST
TECHNIQUE: Multidetector CT imaging of the neck was performed using the
standard protocol following the bolus administration of intravenous
contrast.
CONTRAST:  75mL OMNIPAQUE IOHEXOL 300 MG/ML  SOLN

[Series 3: neck 2.0 i31s 3 (person_name) · axial · 0.47mm/px · z∈[-374,-236]mm · 4 of 117 slices shown, 5 images]
[im 24/117  soft-tissue]
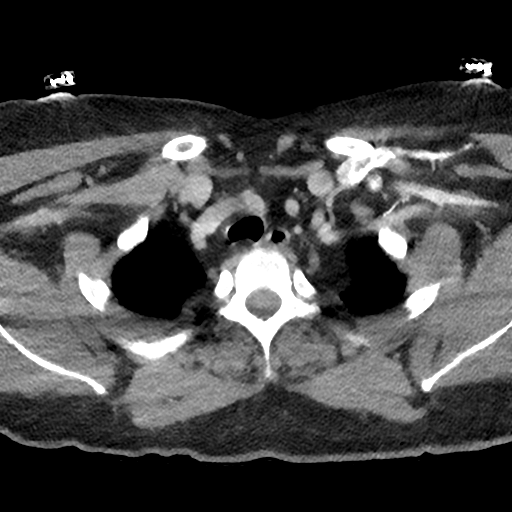
[im 24/117  bone]
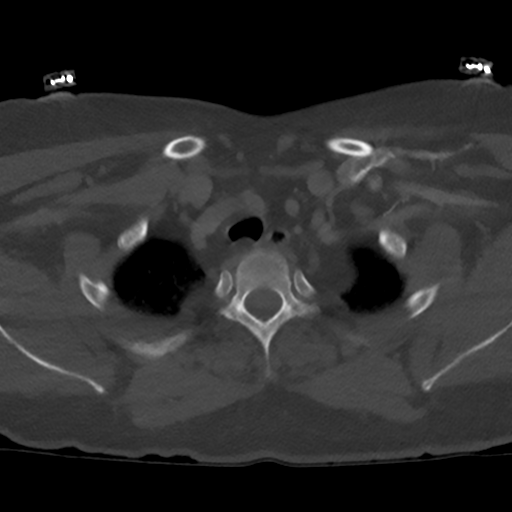
[im 47/117  bone]
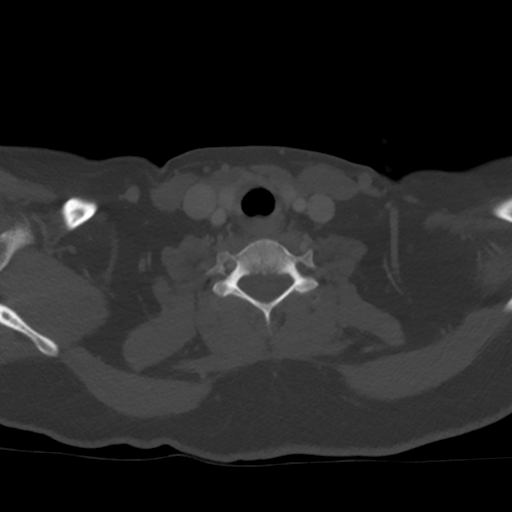
[im 70/117  bone]
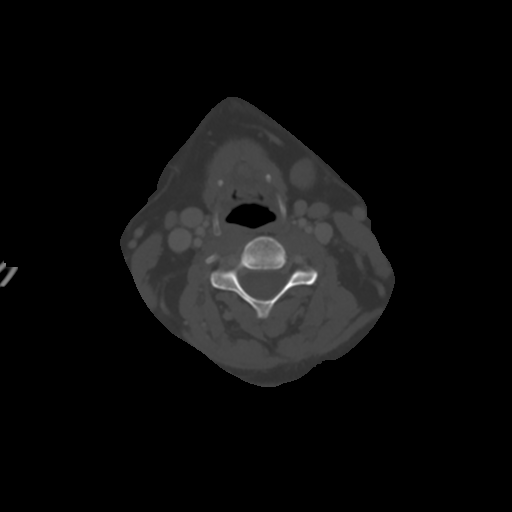
[im 93/117  bone]
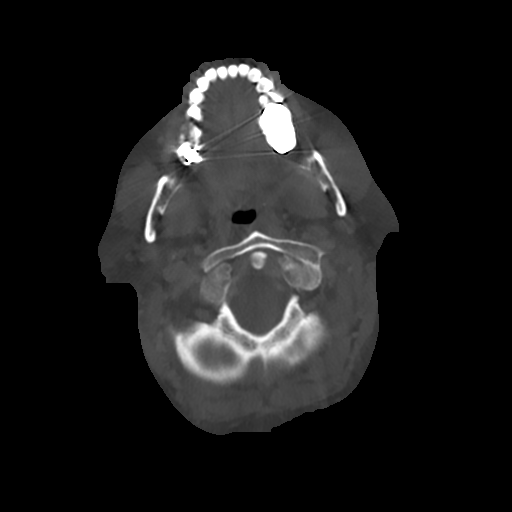

[Series 6: coronal st · coronal · 0.36mm/px · 3 of 118 slices shown]
[im 24/118  bone]
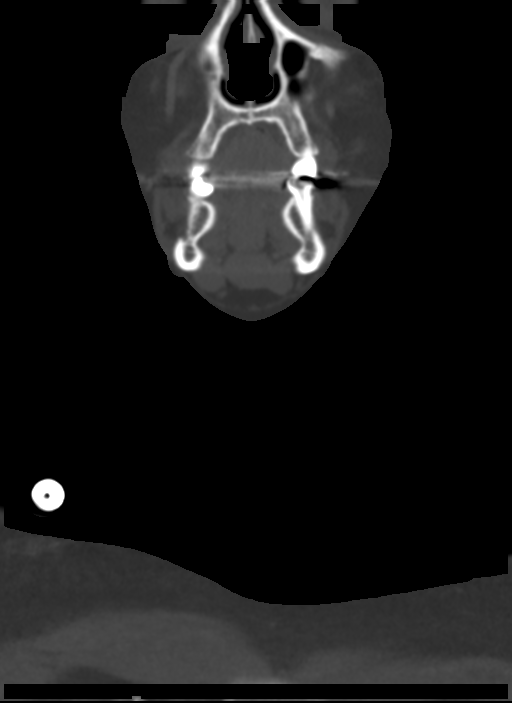
[im 47/118  bone]
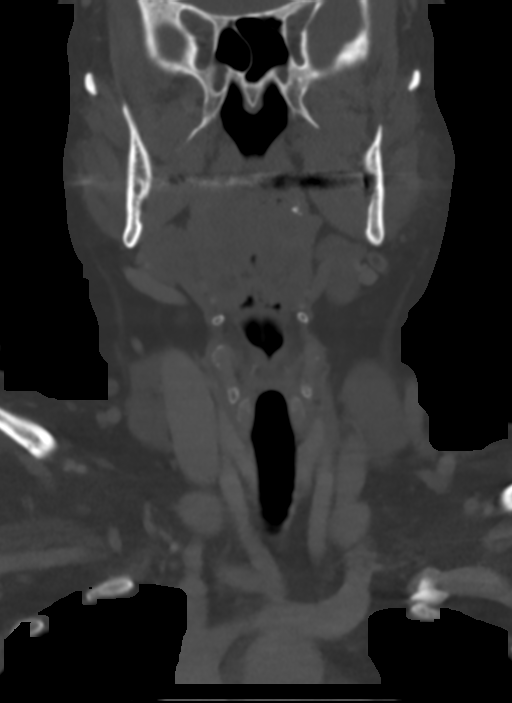
[im 71/118  bone]
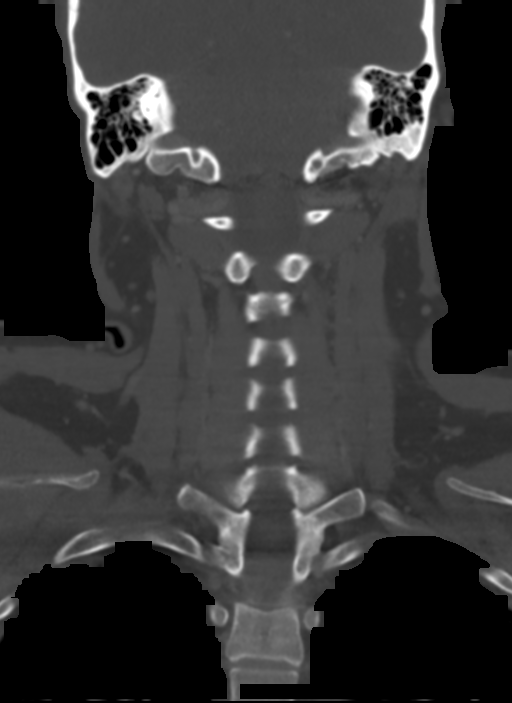

[Series 7: sagittal st · sagittal · 0.47mm/px · 5 of 98 slices shown, 6 images]
[im 33/98  bone]
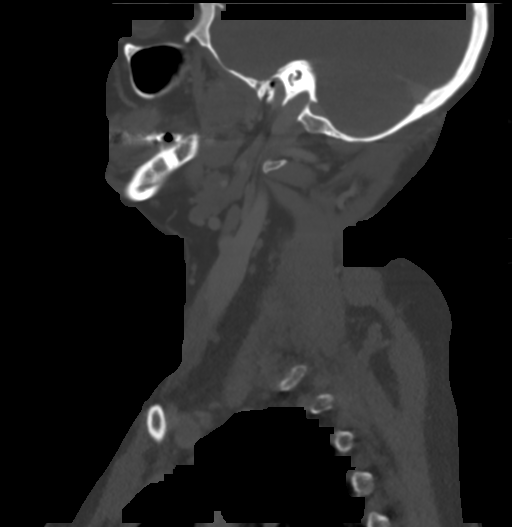
[im 41/98  bone]
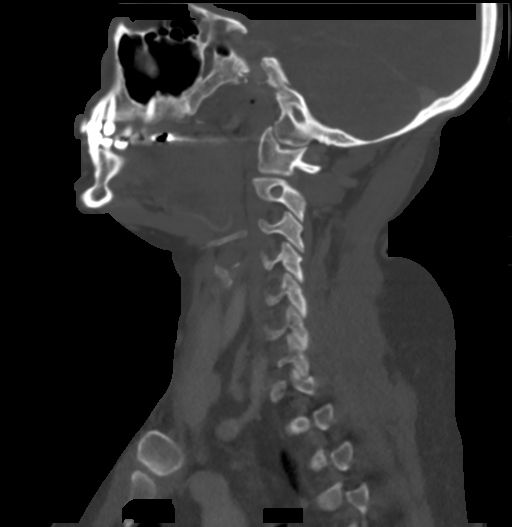
[im 49/98  soft-tissue]
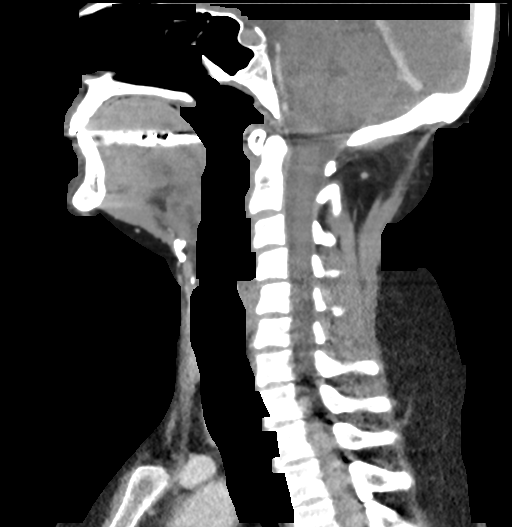
[im 49/98  bone]
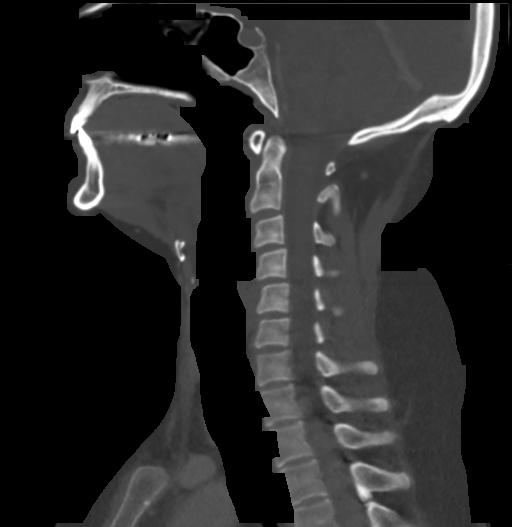
[im 57/98  bone]
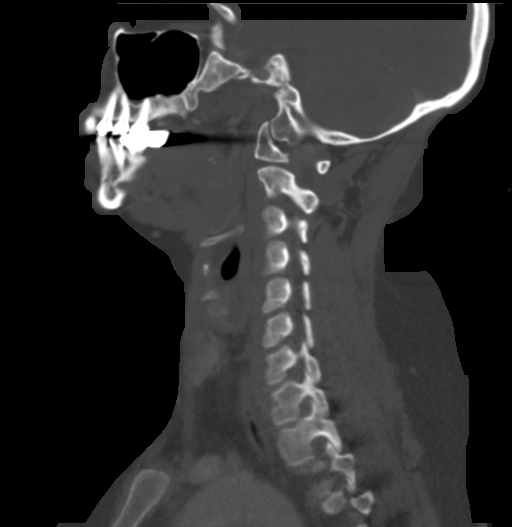
[im 65/98  bone]
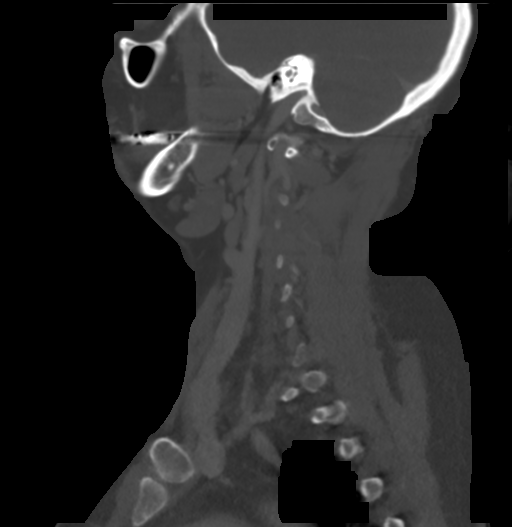

[Series 8: orthogonal st · axial · 0.39mm/px · z∈[-369,-221]mm · 4 of 124 slices shown]
[im 25/124  bone]
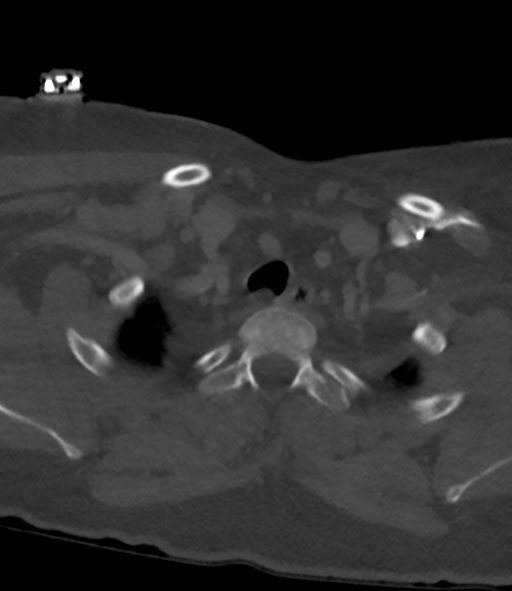
[im 50/124  bone]
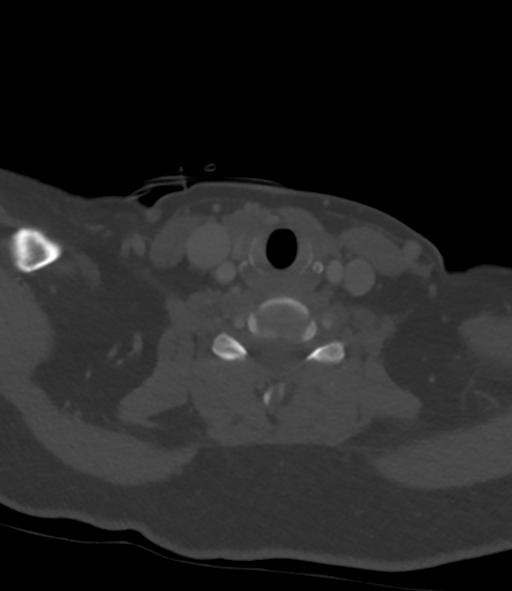
[im 74/124  bone]
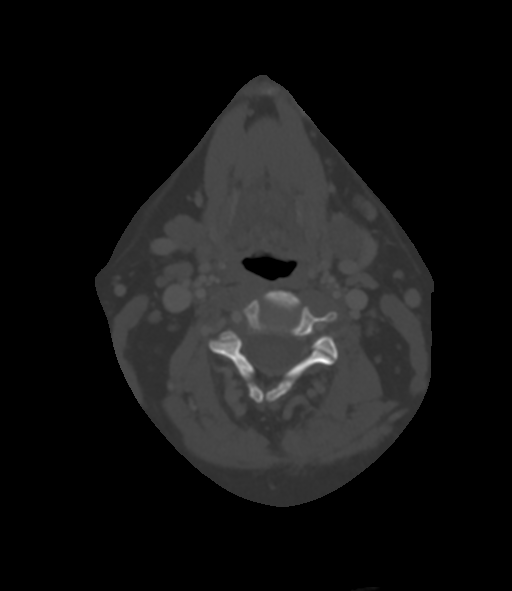
[im 99/124  bone]
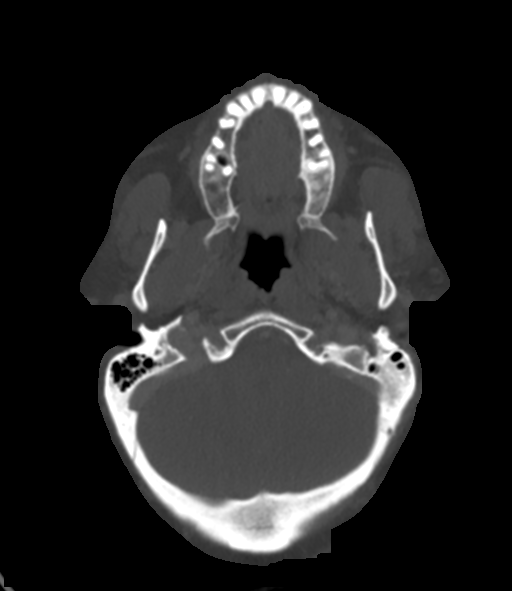

[16 of 33 positions shown; findings below may reference images not displayed]

FINDINGS: Pharynx and larynx: Unremarkable. No mass or swelling. Airway is
patent.

Salivary glands: Parotid and submandibular glands are unremarkable.

Thyroid: Normal.

Lymph nodes: No enlarged or abnormal density nodes.

Vascular: Major neck vessels are patent.

Limited intracranial: No abnormal enhancement.

Visualized orbits: Not included.

Mastoids and visualized paranasal sinuses: Minor mucosal thickening.
Mastoid air cells are clear.

Skeleton: Degenerative changes at the right temporomandibular joint.
No acute osseous abnormality.

Upper chest: Included upper lungs are clear.

Other: None.
IMPRESSION: Patent airway.  No neck mass or significant swelling.

## 2020-09-27 MED ORDER — LACTATED RINGERS IV BOLUS
1000.0000 mL | Freq: Once | INTRAVENOUS | Status: AC
  Administered 2020-09-27: 1000 mL via INTRAVENOUS

## 2020-09-27 MED ORDER — IOHEXOL 300 MG/ML  SOLN
75.0000 mL | Freq: Once | INTRAMUSCULAR | Status: AC | PRN
  Administered 2020-09-27: 75 mL via INTRAVENOUS

## 2020-09-27 MED ORDER — ENOXAPARIN SODIUM 40 MG/0.4ML IJ SOSY
40.0000 mg | PREFILLED_SYRINGE | Freq: Every day | INTRAMUSCULAR | Status: DC
Start: 2020-09-28 — End: 2020-09-28
  Administered 2020-09-28: 40 mg via SUBCUTANEOUS
  Filled 2020-09-27: qty 0.4

## 2020-09-27 MED ORDER — PANTOPRAZOLE SODIUM 40 MG PO TBEC: 40.0000 mg | DELAYED_RELEASE_TABLET | Freq: Every day | ORAL | Status: DC

## 2020-09-27 MED ORDER — NORTRIPTYLINE HCL 10 MG PO CAPS
20.0000 mg | ORAL_CAPSULE | Freq: Every day | ORAL | Status: DC
  Filled 2020-09-27 (×2): qty 2

## 2020-09-27 MED ORDER — RACEPINEPHRINE HCL 2.25 % IN NEBU
0.5000 mL | INHALATION_SOLUTION | Freq: Once | RESPIRATORY_TRACT | Status: AC
  Administered 2020-09-27: 0.5 mL via RESPIRATORY_TRACT
  Filled 2020-09-27: qty 0.5

## 2020-09-27 MED ORDER — ACETAMINOPHEN 650 MG RE SUPP: 650.0000 mg | Freq: Four times a day (QID) | RECTAL | Status: DC | PRN

## 2020-09-27 MED ORDER — SODIUM CHLORIDE 0.9 % IV BOLUS
1000.0000 mL | Freq: Once | INTRAVENOUS | Status: AC
Start: 2020-09-27 — End: 2020-09-27
  Administered 2020-09-27: 1000 mL via INTRAVENOUS

## 2020-09-27 MED ORDER — ACETAMINOPHEN 325 MG PO TABS: 650.0000 mg | ORAL_TABLET | Freq: Four times a day (QID) | ORAL | Status: DC | PRN

## 2020-09-27 MED ORDER — LACTATED RINGERS IV SOLN: INTRAVENOUS | Status: DC

## 2020-09-27 MED ORDER — ALBUTEROL SULFATE HFA 108 (90 BASE) MCG/ACT IN AERS
2.0000 | INHALATION_SPRAY | RESPIRATORY_TRACT | Status: DC | PRN
  Administered 2020-09-27: 2 via RESPIRATORY_TRACT
  Filled 2020-09-27: qty 6.7

## 2020-09-27 MED ORDER — POTASSIUM CHLORIDE 10 MEQ/100ML IV SOLN
10.0000 meq | INTRAVENOUS | Status: AC
  Administered 2020-09-27 – 2020-09-28 (×3): 10 meq via INTRAVENOUS
  Filled 2020-09-27 (×3): qty 100

## 2020-09-27 MED ORDER — ESCITALOPRAM OXALATE 10 MG PO TABS
10.0000 mg | ORAL_TABLET | Freq: Every day | ORAL | Status: DC
  Administered 2020-09-28: 10 mg via ORAL
  Filled 2020-09-27: qty 1

## 2020-09-27 MED ORDER — ESTRADIOL 1 MG PO TABS
2.0000 mg | ORAL_TABLET | Freq: Every day | ORAL | Status: DC
  Administered 2020-09-28: 2 mg via ORAL
  Filled 2020-09-27: qty 1
  Filled 2020-09-27: qty 2

## 2020-09-27 MED ORDER — LIDOCAINE VISCOUS HCL 2 % MT SOLN
15.0000 mL | Freq: Once | OROMUCOSAL | Status: AC
  Administered 2020-09-27: 15 mL via OROMUCOSAL
  Filled 2020-09-27: qty 15

## 2020-09-27 MED ORDER — FAMOTIDINE IN NACL 20-0.9 MG/50ML-% IV SOLN
20.0000 mg | Freq: Once | INTRAVENOUS | Status: AC
  Administered 2020-09-27: 20 mg via INTRAVENOUS
  Filled 2020-09-27: qty 50

## 2020-09-27 MED ORDER — MEDROXYPROGESTERONE ACETATE 2.5 MG PO TABS
5.0000 mg | ORAL_TABLET | Freq: Every day | ORAL | Status: DC
  Administered 2020-09-28: 5 mg via ORAL
  Filled 2020-09-27: qty 2

## 2020-09-27 MED ORDER — DIPHENHYDRAMINE HCL 50 MG/ML IJ SOLN
12.5000 mg | Freq: Once | INTRAMUSCULAR | Status: AC
  Administered 2020-09-27: 12.5 mg via INTRAVENOUS
  Filled 2020-09-27: qty 1

## 2020-09-27 MED ORDER — SODIUM CHLORIDE 0.9 % IV SOLN
2.0000 g | Freq: Once | INTRAVENOUS | Status: AC
  Administered 2020-09-27: 2 g via INTRAVENOUS
  Filled 2020-09-27: qty 20

## 2020-09-27 MED ORDER — POTASSIUM CHLORIDE CRYS ER 20 MEQ PO TBCR
40.0000 meq | EXTENDED_RELEASE_TABLET | Freq: Once | ORAL | Status: AC
  Administered 2020-09-27: 40 meq via ORAL
  Filled 2020-09-27: qty 2

## 2020-09-27 NOTE — ED Notes (Signed)
Patient transported to X-ray 

## 2020-09-27 NOTE — ED Notes (Signed)
ED Provider at bedside. 

## 2020-09-27 NOTE — ED Triage Notes (Signed)
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 Hx of asthma. Sts it feels like something is in her airway keeping her from taking a deep breath. Violently coughing in triage.

## 2020-09-27 NOTE — Consult Note (Signed)
Reason for Consult: Hoarseness and stridor Referring Physician: ER  Julie Bennett is an 45 y.o. female.  HPI: Patient had a episode of difficulty breathing starting at 9:00 this morning.  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  She had no eating activity at the time of onset.  She has not taken any new medications, had any new environmental exposure, or any new foods.  She has not had this problem previous.  Her symptoms were a difficulty with speaking and a sense that she was obstructed of her airway.  It felt like it was in the upper airway.  Apparently she had some stridor.  She was seen in the emergency room this morning with racemic epi treatments given and she felt better but now is still having some problems especially with throat clearing and mucus feeling.  She has a recent history of a endoscopy with significant amount of irritation in her stomach and esophagus by GI observation.  She is on once a day Prilosec.  She does have a white count of 22,000 but no fever.  She is not had any upper respiratory symptoms.  Past Medical History:  Diagnosis Date  . Amenorrhea   . Anxiety   . Depression   . Headache   . 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 DILATION AND CURETTAGE OF UTERUS    . hsyteroscopy    . LAPAROSCOPY    . TUBAL LIGATION      Family History  Problem Relation Age of Onset  . Ovarian cancer Mother   . Throat cancer Father     Social History:  reports that she has never smoked. She has never used smokeless tobacco. She reports that she does not drink alcohol and does not use drugs.  Allergies: No Known Allergies  Medications: I have reviewed the patient's current medications.  Results for orders placed or performed during the hospital encounter of 09/27/20 (from the past 48 hour(s))  CBC with Differential     Status: Abnormal   Collection Time: 09/27/20  4:01 PM  Result  Value Ref Range   WBC 23.2 (H) 4.0 - 10.5 K/uL   RBC 4.00 3.87 - 5.11 MIL/uL   Hemoglobin 12.3 12.0 - 15.0 g/dL   HCT 25.9 56.3 - 87.5 %   MCV 94.3 80.0 - 100.0 fL   MCH 30.8 26.0 - 34.0 pg   MCHC 32.6 30.0 - 36.0 g/dL   RDW 64.3 32.9 - 51.8 %   Platelets 301 150 - 400 K/uL   nRBC 0.0 0.0 - 0.2 %   Neutrophils Relative % 92 %   Neutro Abs 21.4 (H) 1.7 - 7.7 K/uL   Lymphocytes Relative 5 %   Lymphs Abs 1.2 0.7 - 4.0 K/uL   Monocytes Relative 2 %   Monocytes Absolute 0.3 0.1 - 1.0 K/uL   Eosinophils Relative 0 %   Eosinophils Absolute 0.0 0.0 - 0.5 K/uL   Basophils Relative 0 %   Basophils Absolute 0.1 0.0 - 0.1 K/uL   Immature Granulocytes 1 %   Abs Immature Granulocytes 0.20 (H) 0.00 - 0.07 K/uL    Comment: Performed at [facility], [address], [place], [place] [address]  Basic metabolic panel     Status: Abnormal   Collection Time: 09/27/20  4:01 PM  Result Value Ref Range   Sodium 139 135 - 145 mmol/L  Potassium 3.0 (L) 3.5 - 5.1 mmol/L   Chloride 110 98 - 111 mmol/L   CO2 15 (L) 22 - 32 mmol/L   Glucose, Bld 210 (H) 70 - 99 mg/dL    Comment: Glucose reference range applies only to samples taken after fasting for at least 8 hours.   BUN 10 6 - 20 mg/dL   Creatinine, Ser 7.93 (H) 0.44 - 1.00 mg/dL   Calcium 8.1 (L) 8.9 - 10.3 mg/dL   GFR, Estimated 52 (L) >60 mL/min    Comment: (NOTE) Calculated using the CKD-EPI Creatinine Equation (2021)    Anion gap 14 5 - 15    Comment: Performed at [facility], [address], [place], [place] [address]  I-stat chem 8, ED (not at [facility] or [facility])     Status: Abnormal   Collection Time: 09/27/20  4:26 PM  Result Value Ref Range   Sodium 142 135 - 145 mmol/L   Potassium 3.0 (L) 3.5 - 5.1 mmol/L   Chloride 109 98 - 111 mmol/L   BUN 8 6 - 20 mg/dL   Creatinine, Ser 9.23 0.44 - 1.00 mg/dL   Glucose, Bld 300 (H) 70 - 99 mg/dL    Comment: Glucose reference range applies only to samples taken after fasting for at least  8 hours.   Calcium, Ion 1.06 (L) 1.15 - 1.40 mmol/L   TCO2 16 (L) 22 - 32 mmol/L   Hemoglobin 11.6 (L) 12.0 - 15.0 g/dL   HCT 76.2 (L) 26.3 - 33.5 %  POC CBG, ED     Status: Abnormal   Collection Time: 09/27/20  7:32 PM  Result Value Ref Range   Glucose-Capillary 143 (H) 70 - 99 mg/dL    Comment: Glucose reference range applies only to samples taken after fasting for at least 8 hours.    DG Neck Soft Tissue  Result Date: 09/27/2020 CLINICAL DATA:  Dyspnea EXAM: NECK SOFT TISSUES - 1+ VIEW COMPARISON:  None. FINDINGS: There is no evidence of retropharyngeal soft tissue swelling or epiglottic enlargement. The cervical airway is unremarkable and no radio-opaque foreign body identified. IMPRESSION: Negative. Electronically Signed   By: Helyn Numbers MD   On: 09/27/2020 17:08   DG Chest 2 View  Result Date: 09/27/2020 CLINICAL DATA:  Shortness of breath.  Cough. EXAM: CHEST - 2 VIEW COMPARISON:  05/17/2018 FINDINGS: Poor inspiration. Normal sized heart. Clear lungs. Unremarkable bones. IMPRESSION: No active cardiopulmonary disease. Electronically Signed   By: Beckie Salts M.D.   On: 09/27/2020 11:17   CT Soft Tissue Neck W Contrast  Result Date: 09/27/2020 CLINICAL DATA:  Shortness of breath, difficulty swallowing EXAM: CT NECK WITH CONTRAST TECHNIQUE: Multidetector CT imaging of the neck was performed using the standard protocol following the bolus administration of intravenous contrast. CONTRAST:  73mL OMNIPAQUE IOHEXOL 300 MG/ML  SOLN COMPARISON:  None. FINDINGS: Pharynx and larynx: Unremarkable. No mass or swelling. Airway is patent. Salivary glands: Parotid and submandibular glands are unremarkable. Thyroid: Normal. Lymph nodes: No enlarged or abnormal density nodes. Vascular: Major neck vessels are patent. Limited intracranial: No abnormal enhancement. Visualized orbits: Not included. Mastoids and visualized paranasal sinuses: Minor mucosal thickening. Mastoid air cells are clear. Skeleton:  Degenerative changes at the right temporomandibular joint. No acute osseous abnormality. Upper chest: Included upper lungs are clear. Other: None. IMPRESSION: Patent airway.  No neck mass or significant swelling. Electronically Signed   By: Guadlupe Spanish M.D.   On: 09/27/2020 17:24    ROS  Blood pressure 121/61, pulse (!) 115, resp. rate 16, height 5\' 5"  (1.651 m), weight 81.2 kg, last menstrual period 08/14/2013, SpO2 97 %. Physical Exam HENT:     Head:     Comments: She looks comfortable in no distress.  No stridor.  She has no evidence of any swelling of the oral pharynx or oral cavity.  Fiberoptic exam reveals the nasopharynx to be clear.  Base of tongue is normal as well as epiglottis.  Both vocal cords are easily visualized and move normally.  There is no evidence of lesions, swelling, or erythema.  There is a slight amount of interarytenoid edema and may be a slight erythema to both vocal processes consistent with reflux findings.  Hypopharynx is normal as well.    Right Ear: Tympanic membrane normal.     Left Ear: Tympanic membrane normal.     Nose: Nose normal.     Mouth/Throat:     Mouth: Mucous membranes are moist.  Eyes:     Extraocular Movements: Extraocular movements intact.     Pupils: Pupils are equal, round, and reactive to light.  Musculoskeletal:     Cervical back: Normal range of motion and neck supple.       Assessment/Plan: Laryngopharyngeal reflux-she by clinical history seems to be having laryngospasm.  She is not having it as significant as she did this morning.  This most likely is secondary to reflux although there may be an anxiety/functional issue as well.  She had no findings on fiberoptic exam of any mass or lesion.  No obstruction.  Her CT scan was clear.  I would recommend twice daily Prilosec as well as a nighttime before bed H2 blocker.  She should follow-up with otolaryngology in a few weeks sooner if anything is worse or not resolved.  08/16/2013 09/27/2020, 7:51 PM

## 2020-09-27 NOTE — H&P (Addendum)
Date: 09/28/2020               Patient Name:  Julie Bennett MRN: [id number]  DOB: Apr 12, 1976 Age / Sex: 46 y.o., female   PCP: Verlon Au, MD         Medical Service: Internal Medicine Teaching Service         Attending Physician: Dr. Debe Coder, MD    First Contact: Dr. Evlyn Kanner, MD Pager: [phone]  Second Contact: Dr. Eliezer Bottom, MD Pager: [phone]       After Hours (After 5p/  First Contact Pager: [phone]  weekends / holidays): Second Contact Pager: [phone]   Chief Complaint: Shortness of breath  History of Present Illness: Julie Bennett is a 45 year old woman with past medical history of migraines, irritable bowel syndrome - constipation, anxiety, depression, premature menopause and gastritis who presented to [facility] on 09/27/2020 for evaluation of sudden onset of shortness of breath.  Patient states that this morning she woke up in her normal state of health without any new symptoms. 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 She denies any new changes to her morning routine or environmental exposures. She requested that her husband bring her home inhaler and requested that her administrator at work call her an ambulance however they "refused" so her husband brought her to the ED. Due to prolonged wait time, she left and presented to [facility]. While there, she continued to endorse symptoms of coughing, shortness of breath and sensation to clear her throat. She states that she has not had similar symptoms in the past. Upon our evaluation, she continues to endorse sensation of needing to clear her throat, frequent coughing, episodic shortness of breath, chest tightness, headaches, nausea.  ED Course: Upon arrival to urgent care, patient received intramuscular epinephrine and  dexamethasone. On route from transferring from urgent care to [facility], EMS gave albuterol, duoneb, magnesium, racemic epinephrine, and 750cc bolus of normal saline.  On arrival to the ED, patient was afebrile, blood pressure 128/87, heart rate 109, respiratory rate of 30, saturating at 100% on room air. Initial EKG showed sinus tachycardia. Initial labs CBC with Diff revealed WBC 23.2, Abs Neutro 21.4; BMP with potassium of 3.0, CO2 15, glucose 210, creatinine 1.30 and BUN 10; lactic acid of 5.8; blood cultures, urinalysis and respiratory panel collected. CXR and neck soft tissue radiograph were unremarkable; CT soft tissue neck showed patent airway with no masses or swelling. Patient received IV diphenhydramine, IV famotidine, racemic epinephrine, oral lidocaine, potassium, ceftriaxone, and two 1L boluses of fluids. ENT was consulted who performed fiberoptic examination which was unremarkable and felt presentation was secondary to laryngospasm secondary to reflux with anxiety/functional component with recommendation for twice daily Prilosec, H2 blocker and outpatient follow-up. IMTS subsequently called for admission.  Medications:  Current Meds  Medication Sig  . escitalopram (LEXAPRO) 10 MG tablet Take 10 mg by mouth daily.  Marland Kitchen estradiol (ESTRACE) 2 MG tablet Take 2 mg by mouth daily.  . medroxyPROGESTERone (PROVERA) 5 MG tablet Take 5 mg by mouth daily.  . nortriptyline (PAMELOR) 10 MG capsule Take 20 mg by mouth at bedtime.  . pantoprazole (PROTONIX) 40 MG tablet Take 40 mg by mouth daily.  . Vitamin D, Ergocalciferol, (DRISDOL) 1.25 MG (50000 UNIT) CAPS capsule Take 1 capsule  by mouth once a week.   Allergies: Allergies as of 09/27/2020  . (No Known Allergies)   Past Medical History:  Diagnosis Date  . Amenorrhea   . Anxiety   . Depression   . Headache   . 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 She lives at home with her husband and three children. She denies tobacco, alcohol or drug use.  Review of Systems: A complete ROS was negative except as per HPI.   Physical Exam: Blood pressure 122/75, pulse 91, temperature 98.9 F (37.2 C), temperature source Oral, resp. rate 14, height 5\' 5"  (1.651 m), weight 81.2 kg, last menstrual period 08/14/2013, SpO2 96 %. Physical Exam Constitutional:      Comments: Uncomfortable-appearing woman, supine in ED bed, frequently coughing and clearing throat but able to talk in full sentences without pauses  HENT:     Head: Normocephalic and atraumatic.     Mouth/Throat:     Mouth: Mucous membranes are moist.     Pharynx: Oropharynx is clear. No pharyngeal swelling, oropharyngeal exudate, posterior oropharyngeal erythema or uvula swelling.  Eyes:     Extraocular Movements: Extraocular movements intact.     Conjunctiva/sclera: Conjunctivae normal.  Cardiovascular:     Rate and Rhythm: Normal rate and regular rhythm.     Pulses: Normal pulses.     Heart sounds: Normal heart sounds.  Pulmonary:     Effort: Pulmonary effort is normal. No respiratory distress.     Breath sounds: Normal breath sounds. No stridor. No wheezing, rhonchi or rales.  Abdominal:     General: Abdomen is flat. Bowel sounds are normal.     Palpations: Abdomen is soft.     Tenderness: There is no abdominal tenderness.  Musculoskeletal:        General: Normal range of motion.     Cervical back: Normal range of motion and neck supple.     Right lower leg: No edema.     Left lower leg: No edema.  Skin:    General: Skin is warm and dry.     Capillary Refill: Capillary refill takes less than 2 seconds.  Neurological:     General: No focal deficit present.     Mental Status: She is alert and oriented to person, place, and time. Mental status is at baseline.  Psychiatric:        Attention and  Perception: Attention and perception normal.        Mood and Affect: Mood is anxious.        Speech: Speech normal.        Behavior: Behavior normal. Behavior is cooperative.        Thought Content: Thought content normal.        Cognition and Memory: Cognition and memory normal.        Judgment: Judgment normal.   EKG: personally reviewed my interpretation is sinus tachycardia   Assessment & Plan by Problem: Active Problems:   Laryngospasm  Annisa Mazzarella is a 45 year old woman with past medical history of migraines, irritable bowel syndrome - constipation, anxiety, depression, premature menopause, and gastritis who presented to [facility] on 09/27/2020 for evaluation of sudden onset of shortness of breath found to have laryngospasm.  #Laryngospasm Patient presented following the sudden onset of significant coughing, frequent throat clearing and shortness of  breath. Patient's treatment thus far has included intramuscular epinephrine, dexamethasone, albuterol, duonebs, IV magnesium, racemic epinephrine times two, IV diphenhydramine, IV famotidine, and oral lidocaine with persistence of her symptoms. Workup thus far including CXR, neck radiograph and CT soft tissue neck has remained unremarkable. ENT performed bedside fiberoptic examination without abnormal findings. Patient is hemodynamically stable, afebrile and saturating well on room air with no abnormalities on head and neck or respiratory examination. Appreciate ENT consultants assessment and recommendations for patient as her symptoms are likely secondary to laryngopharyngeal reflux with likely functional component. Patient would benefit from close monitoring and evaluation from SLP with consideration for further imaging if clinically indicated. -ENT consulted, appreciate recommendations  -Famotidine 10mg  nightly  -Pantoprazole 40mg  twice daily  -SLP eval and treat  -Monitor respiratory status closely -COVID swab -Cardiac  monitoring  #Leukocytosis, active Patient presented with leukocytosis of 23.2 following treatment as described above including dexamethasone administration. Likely combination of iatrogenic cause and stress reaction. WBC count improved to 19.3 on most recent collection. No signs or symptoms to suggest underlying infection. -CBC with Diff tomorrow morning -Discontinue ceftriaxone  #Hyperglycemia, active Patient presents with blood glucose of 210 following treatment as described above including dexamethasone administration. Patient does not have history of diabetes with recent hemoglobin A1c of 4.9 two months prior to this admission. Likely iatrogenic and stress reaction. -Continue to monitor blood glucose on repeat labs  #Lactic acidosis, active Several hours following presentation, patient noted to have metabolic acidosis with elevated lactic acid level of 5.8 with improvement to 5.1 on repeat. Likely combination of dehydration as her urinalysis reveals high specific gravity as well as iatrogenic from epinephrine administration. -IVF with LR at 150cc/hr -Monitor bicarbonate on repeat CMP  #Hypokalemia, active Potassium of 3.0 on admission likely iatrogenic secondary to albuterol use. Initially repleting with oral and IV potassium, however patient had significant burning pain in her left upper extremity following completion of her IVF infusion with continuation of her IV potassium. -Continue oral potassium repletion -Discontinue further IV potassium supplementation -Monitor potassium on CMP -Obtain Mg  #Migraines, chronic Patient endorses a chronic history of daily migraines for which she previously underwent evaluation by neurology. Patient states that NSAIDs and acetaminophen relieve her migraines.  -Acetaminophen 650mg  every six hours PRN  #Anxiety, chronic #Depression, chronic Patient reports taking escitalopram and recently starting nortriptyline for her history of  depression. -Continue home escitalopram 10mg  daily -Continue home nortriptyline 20mg  nightly  #Premature menopause Patient reports taking progestin and estrogen supplementation for her history of premature menopause. -Continue home medroxyprogesterone 5mg  daily -Continue home Estradiol 2mg  daily  #Diet: NPO, awaiting SLP evaluation #VTE ppx: Enoxaparin 40mg  daily #IVF: Status post 2L of IVF, 150cc/hr LR #Code status: Full code  Dispo: Admit patient to Observation with expected length of stay less than 2 midnights.  Signed: , MD 09/28/2020, 1:17 AM  Pager: [phone]

## 2020-09-27 NOTE — ED Provider Notes (Signed)
[facility]   CSN: [id number] Arrival date & time: 09/27/20  1457    History Chief Complaint  Patient presents with  . Allergic Reaction    Julie Bennett is a 45 y.o. female with past medical history significant for headache, depression who presents for evaluation of cough, neck tightness.  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  She went to swallow her saliva and began coughing.  She felt like she was unable to catch her breath.  States she has history of asthma however nothing similar to this.  Is like there is something in her "neck and airway keeping me from taking a deep breath."  Unfortunately had prolonged wait out in the waiting room.  Left and was seen by urgent care.  Was given IM epi and Decadron at urgent care.  EMS arrival patient given albuterol, DuoNeb, mag, racemic epi, normal saline.  They thought patient had stridor and tightness in the lower lobes.  They felt like the racemic epi seem to help her symptoms. No CP, SOB however states tightness in throat. Denies unilateral leg swelling, redness or warmth.  No prior history of PE or DVT.  CNo CP, Back pain. Denies additional aggravating or alleviating factors.  She has not noted any urticaria, nausea, vomiting. No hx of angioedema.  History obtained from patient and past medical records.  No interpreter is used  HPI     Past Medical History:  Diagnosis Date  . Amenorrhea   . Anxiety   . Depression   . Headache   . 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 Premature menopause 05/23/2016  . Amenorrhea 05/21/2016  . Bacterial vaginosis 05/21/2016  . Ovarian cyst 03/18/2014    Past Surgical History:  Procedure Laterality Date  . DILATION AND CURETTAGE OF UTERUS    . hsyteroscopy    . LAPAROSCOPY    . TUBAL LIGATION       OB History   No obstetric history on file.      Family History  Problem Relation Age of Onset  . Ovarian cancer Mother   . Throat cancer Father     Social History   Tobacco Use  . Smoking status: Never Smoker  . Smokeless tobacco: Never Used  Substance Use Topics  . Alcohol use: No  . Drug use: No    Home Medications Prior to Admission medications   Medication Sig Start Date End Date Taking? Authorizing Provider  estradiol (ESTRACE) 1 MG tablet Take 1 mg by mouth daily.  07/07/16 05/17/18  [provider]  levocetirizine Elita Boone(XYZAL) 5 MG tablet  11/29/18   [provider]  medroxyPROGESTERone (PROVERA) 5 MG tablet Take 5 mg by mouth daily. 03/11/19   [provider]  meloxicam (MOBIC) 15 MG tablet Take 1 tablet (15 mg total) by mouth daily. 04/10/20   Felecia ShellingEvans, Brent M, DPM  methylPREDNISolone (MEDROL DOSEPAK) 4 MG TBPK tablet 6 day dose pack - take as directed 04/10/20   Felecia ShellingEvans, Brent M, DPM  oxyCODONE-acetaminophen (PERCOCET) 5-325 MG tablet Take 1 tablet by mouth every 6 (six) hours as needed for severe pain. 05/18/19   Felecia ShellingEvans, Brent M, DPM  rizatriptan (MAXALT-MLT) 10 MG disintegrating tablet Take 1 tablet (10 mg total) by mouth as needed for migraine. 09/02/17   Micki RileySethi, Pramod S, MD    Allergies    Patient has no known  allergies.  Review of Systems   Review of Systems  Constitutional: Negative.   HENT: Positive for sore throat and trouble swallowing. Negative for congestion, drooling, ear discharge, ear pain, facial swelling, nosebleeds, postnasal drip, rhinorrhea, sinus pressure, sinus pain, sneezing and voice change.   Eyes: Negative.   Respiratory: Positive for cough and shortness of breath.   Cardiovascular: Negative.   Gastrointestinal: Negative.   Genitourinary: Negative.   Musculoskeletal: Negative.   Skin: Negative.   Neurological: Negative.   All other systems reviewed and are negative.   Physical Exam Updated Vital Signs BP 93/72   Pulse (!) 113   Resp 20   Ht 5\' 5"  (1.651 m)    Wt 81.2 kg   LMP 08/14/2013 Comment: signed preg waiver 6.26.2020  SpO2 95%   BMI 29.79 kg/m   Physical Exam Vitals and nursing note reviewed.  Constitutional:      General: She is in acute distress.     Appearance: She is well-developed. She is not ill-appearing or toxic-appearing.  HENT:     Head: Normocephalic and atraumatic.     Jaw: There is normal jaw occlusion.     Nose: Nose normal.     Mouth/Throat:     Lips: Pink.     Mouth: Mucous membranes are moist.     Comments: No evidence of angioedema, unable to visualize tonsils or uvula due to patient actively coughing Eyes:     Pupils: Pupils are equal, round, and reactive to light.  Neck:     Comments: No neck stiffness or neck rigidity.  Cardiovascular:     Rate and Rhythm: Tachycardia present.     Pulses: Normal pulses.          Radial pulses are 2+ on the right side and 2+ on the left side.       Dorsalis pedis pulses are 2+ on the right side and 2+ on the left side.     Heart sounds: Normal heart sounds.  Pulmonary:     Effort: Tachypnea present. No respiratory distress.     Breath sounds: Stridor and decreased air movement present.     Comments: Tachypneic, 10 L on nonrebreather, mild stridor, decreased air movement bilaterally Chest:     Comments: Rise and fall to chest wall, non-tender. Abdominal:     General: Bowel sounds are normal. There is no distension.     Palpations: Abdomen is soft.     Tenderness: There is no abdominal tenderness.     Comments: Soft, nontender  Musculoskeletal:        General: Normal range of motion.     Cervical back: Full passive range of motion without pain and normal range of motion.     Comments: No tenderness.  Compartments soft.  Skin:    General: Skin is warm and dry.     Capillary Refill: Capillary refill takes less than 2 seconds.     Comments: No edema, erythema or warmth  Neurological:     Mental Status: She is alert.    ED Results / Procedures / Treatments    Labs (all labs ordered are listed, but only abnormal results are displayed) Labs Reviewed  CBC WITH DIFFERENTIAL/PLATELET - Abnormal; Notable for the following components:      Result Value   WBC 23.2 (*)    Neutro Abs 21.4 (*)    Abs Immature Granulocytes 0.20 (*)    All other components within normal limits  BASIC METABOLIC PANEL - Abnormal; Notable  for the following components:   Potassium 3.0 (*)    CO2 15 (*)    Glucose, Bld 210 (*)    Creatinine, Ser 1.30 (*)    Calcium 8.1 (*)    GFR, Estimated 52 (*)    All other components within normal limits  I-STAT CHEM 8, ED - Abnormal; Notable for the following components:   Potassium 3.0 (*)    Glucose, Bld 210 (*)    Calcium, Ion 1.06 (*)    TCO2 16 (*)    Hemoglobin 11.6 (*)    HCT 34.0 (*)    All other components within normal limits  CULTURE, BLOOD (SINGLE)  LACTIC ACID, PLASMA  LACTIC ACID, PLASMA    EKG None  Radiology DG Neck Soft Tissue  Result Date: 09/27/2020 CLINICAL DATA:  Dyspnea EXAM: NECK SOFT TISSUES - 1+ VIEW COMPARISON:  None. FINDINGS: There is no evidence of retropharyngeal soft tissue swelling or epiglottic enlargement. The cervical airway is unremarkable and no radio-opaque foreign body identified. IMPRESSION: Negative. Electronically Signed   By: Helyn Numbers MD   On: 09/27/2020 17:08   DG Chest 2 View  Result Date: 09/27/2020 CLINICAL DATA:  Shortness of breath.  Cough. EXAM: CHEST - 2 VIEW COMPARISON:  05/17/2018 FINDINGS: Poor inspiration. Normal sized heart. Clear lungs. Unremarkable bones. IMPRESSION: No active cardiopulmonary disease. Electronically Signed   By: Beckie Salts M.D.   On: 09/27/2020 11:17   CT Soft Tissue Neck W Contrast  Result Date: 09/27/2020 CLINICAL DATA:  Shortness of breath, difficulty swallowing EXAM: CT NECK WITH CONTRAST TECHNIQUE: Multidetector CT imaging of the neck was performed using the standard protocol following the bolus administration of intravenous contrast.  CONTRAST:  42mL OMNIPAQUE IOHEXOL 300 MG/ML  SOLN COMPARISON:  None. FINDINGS: Pharynx and larynx: Unremarkable. No mass or swelling. Airway is patent. Salivary glands: Parotid and submandibular glands are unremarkable. Thyroid: Normal. Lymph nodes: No enlarged or abnormal density nodes. Vascular: Major neck vessels are patent. Limited intracranial: No abnormal enhancement. Visualized orbits: Not included. Mastoids and visualized paranasal sinuses: Minor mucosal thickening. Mastoid air cells are clear. Skeleton: Degenerative changes at the right temporomandibular joint. No acute osseous abnormality. Upper chest: Included upper lungs are clear. Other: None. IMPRESSION: Patent airway.  No neck mass or significant swelling. Electronically Signed   By: Guadlupe Spanish M.D.   On: 09/27/2020 17:24    Procedures .Critical Care Performed by: Linwood Dibbles, PA-C Authorized by: Linwood Dibbles, PA-C   Critical care provider statement:    Critical care time (minutes):  45   Critical care was necessary to treat or prevent imminent or life-threatening deterioration of the following conditions:  Respiratory failure   Critical care was time spent personally by me on the following activities:  Discussions with consultants, evaluation of patient's response to treatment, examination of patient, ordering and performing treatments and interventions, ordering and review of laboratory studies, ordering and review of radiographic studies, pulse oximetry, re-evaluation of patient's condition, obtaining history from patient or surrogate and review of old charts     Medications Ordered in ED Medications  potassium chloride SA (KLOR-CON) CR tablet 40 mEq (has no administration in time range)  diphenhydrAMINE (BENADRYL) injection 12.5 mg (12.5 mg Intravenous Given 09/27/20 1601)  famotidine (PEPCID) IVPB 20 mg premix (0 mg Intravenous Stopped 09/27/20 1656)  sodium chloride 0.9 % bolus 1,000 mL (1,000 mLs Intravenous New  Bag/Given 09/27/20 1626)  Racepinephrine HCl 2.25 % nebulizer solution 0.5 mL (0.5 mLs Nebulization Given  09/27/20 1618)  iohexol (OMNIPAQUE) 300 MG/ML solution 75 mL (75 mLs Intravenous Contrast Given 09/27/20 1655)  lidocaine (XYLOCAINE) 2 % viscous mouth solution 15 mL (15 mLs Mouth/Throat Given 09/27/20 1833)   ED Course  I have reviewed the triage vital signs and the nursing notes.  Pertinent labs & imaging results that were available during my care of the patient were reviewed by me and considered in my medical decision making (see chart for details).  45 year old here for evaluation of station of throat closing.  No other evidence of anaphylaxis.  Initially had come to the emergency department however due to prolonged wait left without being seen by provider.  Was seen by urgent care received epi as well as steroids.  Currently was stridorous with EMS.  Was given racemic epi, mag, DuoNeb and albuterol.  Arrival here patient tachypneic, tachycardic with mild stridor.  She denies any chest pain, shortness of breath.  She has no clinical evidence of DVT on exam.  She is not eating when her symptoms started.  I have low suspicion for pain foreign body.  We will give some Benadryl, Pepcid, additional racemic epi.  Patient reassessed.  She appears significantly better however still is clearing her throat and states she feels like she has some mild tightness at the superior aspect of her throat like she cannot swallow however is tolerating her oral secretions without difficulty.  She is tachycardic however this is likely due to her multiple racemic epi treatments.  Low suspicion for PE, ACS or dissection.  She denies any active chest pain and states it is more towards her throat.   Chest x-ray for initial ED visit without any cardiomegaly, pulm edema, pneumothorax, pneumomediastinum X-ray soft tissue neck with any evidence of epiglottitis CT soft tissue neck without any evidence of airway narrowing, edema  or infectious process WBC with leukocytosis at 23.2, unclear etiology of leukocytosis given her only complaint is this tightness in her neck Metabolic panel glucose 210, creatinine 1.3, potassium 3.0 EKG without ischemic  Patient reassessed. Feels improved. However still coughing. Unclear etiology of leukocytosis?  No complaints of infection on exam.  CT reassuring without evidence of tracheitis, epiglottitis.  Care transferred to Dr. Clarice Pole who will follow up on reevaluation determine disposition.    MDM Rules/Calculators/A&P                           Final Clinical Impression(s) / ED Diagnoses Final diagnoses:  Stridor    Rx / DC Orders ED Discharge Orders    None       Mackenzye Mackel A, PA-C 09/27/20 1859    Benjiman Core, MD 09/28/20 [phone]

## 2020-09-27 NOTE — ED Notes (Signed)
Respiratory Tech called to [employer]

## 2020-09-27 NOTE — ED Provider Notes (Addendum)
Patient has had a CT scan for a acute onset of stridorous throat symptoms.  CT is unrevealing.  Patient does have leukocytosis.  She has had racemic epi once with EMS and once in the emergency department.  This did improve symptoms.  Plan to reassess and determine final disposition.  Assessing the patient, she continues to have throat clearing.  She is not comfortably breathing at baseline.  She reports she feels much better than she did at onset.  She reports now that she can breathe and talk whereas upon arrival she could hardly breathe at all and could not talk.  She reports she is most comfortable in the semirecumbent position.  She reports sitting completely upright makes it harder for her to breathe.  He denies she is having any chest pain.  She describes this as having started absolutely out of nowhere earlier in the day.  She has never had any similar episode.  No new medications.  She was not eating at the time.  She denies that she was stung or think she had any kind of insect bite that could have triggered allergy. Physical Exam  BP 93/72   Pulse (!) 113   Resp 20   Ht 5\' 5"  (1.651 m)   Wt 81.2 kg   LMP 08/14/2013 Comment: signed preg waiver 6.26.2020  SpO2 95%   BMI 29.79 kg/m   Physical Exam Constitutional:      Comments: Patient is alert.  She is in semirecumbent position in the stretcher.  Blood pressure is normotensive.  Heart rate is low one teens.  He is doing frequent throat clearing but is not exhibiting acute respiratory distress  HENT:     Mouth/Throat:     Comments: The posterior airway is widely patent.  There is no swelling around the uvula or the soft palate.  No swelling of the tongue.  No high-pitched stridor to auscultation.  Some very mild stridor with rapid inspiration Eyes:     Extraocular Movements: Extraocular movements intact.  Cardiovascular:     Rate and Rhythm: Regular rhythm.  Pulmonary:     Effort: Pulmonary effort is normal.  Musculoskeletal:         General: No swelling.  Skin:    General: Skin is warm and dry.  Neurological:     General: No focal deficit present.     Mental Status: She is oriented to person, place, and time.     ED Course/Procedures     Procedures  MDM  Consult: Reviewed with Dr. 6.28.2020.  ENT.  We will come and see the patient in the emergency department to perform an upper airway scope.  Patient's had 2 treatments of racemic epinephrine.  Symptoms are improved but not resolved.  At 4 hours and no apparent etiology, we will need to continue to observe and treat expectantly.  Patient does have significant leukocytosis she does not give positives on review of systems that would suggest infectious source for this episode.  In the last consideration given to very early epiglottitis this is not showing on CT at this time.  We will continue to hydrate and redraw a lactic and a CBC.  Dr. Jearld Fenton scoped the upper airway and did not find any abnormalities.  Redraw of CBC still show significant leukocytosis and lactic acid was elevated.  This time, there is no etiology for sepsis or bacteremia.  However, with severity of symptoms and the fact that the patient has not completely cleared her symptoms with leukocytosis  and lactic acidosis not resolving with hydration and observation, will empirically give a dose of Rocephin, blood cultures have been obtained and admit for observation.       Arby Barrette, MD 09/27/20 Angelene Giovanni    Arby Barrette, MD 09/27/20 [phone]

## 2020-09-27 NOTE — ED Triage Notes (Signed)
Patient presents to the ED by EMS with allergic reaction from urgent care. The patient left the ER and walked up to UC where she was noted to be short of breath. Received Epi and Decadron there  EMS gave albuterol, duoneb, mag, racemic epi, 750 ns. Stridor and lower lobes tight. Meds seemed to reduce symptoms.

## 2020-09-27 NOTE — ED Notes (Signed)
Left with her husband

## 2020-09-28 ENCOUNTER — Telehealth: Payer: Self-pay | Admitting: Internal Medicine

## 2020-09-28 DIAGNOSIS — R061 Stridor: Secondary | ICD-10-CM | POA: Diagnosis not present

## 2020-09-28 DIAGNOSIS — R739 Hyperglycemia, unspecified: Secondary | ICD-10-CM | POA: Diagnosis not present

## 2020-09-28 DIAGNOSIS — E872 Acidosis: Secondary | ICD-10-CM | POA: Diagnosis not present

## 2020-09-28 DIAGNOSIS — J385 Laryngeal spasm: Secondary | ICD-10-CM | POA: Diagnosis not present

## 2020-09-28 DIAGNOSIS — D72829 Elevated white blood cell count, unspecified: Secondary | ICD-10-CM | POA: Diagnosis not present

## 2020-09-28 DIAGNOSIS — E876 Hypokalemia: Secondary | ICD-10-CM

## 2020-09-28 DIAGNOSIS — F419 Anxiety disorder, unspecified: Secondary | ICD-10-CM

## 2020-09-28 DIAGNOSIS — F32A Depression, unspecified: Secondary | ICD-10-CM

## 2020-09-28 LAB — CBC WITH DIFFERENTIAL/PLATELET
Abs Immature Granulocytes: 0.11 10*3/uL — ABNORMAL HIGH (ref 0.00–0.07)
Basophils Absolute: 0 10*3/uL (ref 0.0–0.1)
Basophils Relative: 0 %
Eosinophils Absolute: 0 10*3/uL (ref 0.0–0.5)
Eosinophils Relative: 0 %
HCT: 35.2 % — ABNORMAL LOW (ref 36.0–46.0)
Hemoglobin: 11.6 g/dL — ABNORMAL LOW (ref 12.0–15.0)
Immature Granulocytes: 1 %
Lymphocytes Relative: 7 %
Lymphs Abs: 1.1 10*3/uL (ref 0.7–4.0)
MCH: 30.9 pg (ref 26.0–34.0)
MCHC: 33 g/dL (ref 30.0–36.0)
MCV: 93.6 fL (ref 80.0–100.0)
Monocytes Absolute: 0.2 10*3/uL (ref 0.1–1.0)
Monocytes Relative: 1 %
Neutro Abs: 15.1 10*3/uL — ABNORMAL HIGH (ref 1.7–7.7)
Neutrophils Relative %: 91 %
Platelets: 270 10*3/uL (ref 150–400)
RBC: 3.76 MIL/uL — ABNORMAL LOW (ref 3.87–5.11)
RDW: 12.9 % (ref 11.5–15.5)
WBC: 16.5 10*3/uL — ABNORMAL HIGH (ref 4.0–10.5)
nRBC: 0 % (ref 0.0–0.2)

## 2020-09-28 LAB — COMPREHENSIVE METABOLIC PANEL
ALT: 16 U/L (ref 0–44)
AST: 24 U/L (ref 15–41)
Albumin: 3 g/dL — ABNORMAL LOW (ref 3.5–5.0)
Alkaline Phosphatase: 78 U/L (ref 38–126)
Anion gap: 9 (ref 5–15)
BUN: 8 mg/dL (ref 6–20)
CO2: 18 mmol/L — ABNORMAL LOW (ref 22–32)
Calcium: 8.2 mg/dL — ABNORMAL LOW (ref 8.9–10.3)
Chloride: 111 mmol/L (ref 98–111)
Creatinine, Ser: 1.11 mg/dL — ABNORMAL HIGH (ref 0.44–1.00)
GFR, Estimated: >60 mL/min (ref >60)
Glucose, Bld: 144 mg/dL — ABNORMAL HIGH (ref 70–99)
Potassium: 4.5 mmol/L (ref 3.5–5.1)
Sodium: 138 mmol/L (ref 135–145)
Total Bilirubin: 0.5 mg/dL (ref 0.3–1.2)
Total Protein: 5.7 g/dL — ABNORMAL LOW (ref 6.5–8.1)

## 2020-09-28 LAB — HIV ANTIBODY (ROUTINE TESTING W REFLEX): HIV Screen 4th Generation wRfx: NONREACTIVE

## 2020-09-28 LAB — CK: Total CK: 226 U/L (ref 38–234)

## 2020-09-28 LAB — LACTIC ACID, PLASMA: Lactic Acid, Venous: 1.9 mmol/L (ref 0.5–1.9)

## 2020-09-28 LAB — MAGNESIUM: Magnesium: 2 mg/dL (ref 1.7–2.4)

## 2020-09-28 MED ORDER — FAMOTIDINE 20 MG PO TABS: 10.0000 mg | ORAL_TABLET | Freq: Every day | ORAL | Status: DC

## 2020-09-28 MED ORDER — PANTOPRAZOLE SODIUM 40 MG PO TBEC
40.0000 mg | DELAYED_RELEASE_TABLET | Freq: Two times a day (BID) | ORAL | Status: DC
  Administered 2020-09-28: 40 mg via ORAL
  Filled 2020-09-28: qty 1

## 2020-09-28 MED ORDER — PANTOPRAZOLE SODIUM 40 MG PO TBEC: 40.0000 mg | DELAYED_RELEASE_TABLET | Freq: Two times a day (BID) | ORAL | 0 refills | Status: DC

## 2020-09-28 MED ORDER — FAMOTIDINE 10 MG PO TABS: 10.0000 mg | ORAL_TABLET | Freq: Every day | ORAL | 0 refills | Status: AC

## 2020-09-28 NOTE — Progress Notes (Signed)
Discharge instructions (including medications) discussed with and copy provided to patient/caregiver 

## 2020-09-28 NOTE — Plan of Care (Signed)

## 2020-09-28 NOTE — Evaluation (Signed)
Clinical/Bedside Swallow Evaluation Patient Details  Name: Julie Bennett MRN: [id number] Date of Birth: 1975-09-30  Today's Date: 09/28/2020 Time: SLP Start Time (ACUTE ONLY): 1059 SLP Stop Time (ACUTE ONLY): 1135 SLP Time Calculation (min) (ACUTE ONLY): 36 min  Past Medical History:  Past Medical History:  Diagnosis Date  . Amenorrhea   . Anxiety   . Depression   . Headache   . 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 DILATION AND CURETTAGE OF UTERUS    . hsyteroscopy    . LAPAROSCOPY    . TUBAL LIGATION     HPI:  45 year old woman with past medical history of migraines, irritable bowel syndrome - constipation, anxiety, depression, premature menopause and gastritis who presented to [facility]/27/2020 for evaluation of sudden onset of severe coughing, inability to clear throat and shortness of breath. 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 ENTT performed fiberoptic examination which revealed mild edema of arytenoids and slight erythema to both vocal processes and felt presentation was secondary to laryngospasm secondary to reflux with anxiety/functional component with recommendation for twice daily Prilosec, H2 blocker and outpatient follow-up.   Assessment / Plan / Recommendation Clinical Impression  Pt denies having a history of dysphagia and was diagnosed with GERD one year ago.This episode is isolated and was not precipitated by strong chemical/perfume odor, weather, exercise or apparent stress. Pt throat clearing and coughing throughout interview/history and oral-motor examination. Question of trace lingual candidias; no posterior pharyngeal erythema upon inspection. Consumption of thin liquid, puree and solid texture was unremarkable over course of evaluation and respiratory/swallow coordination was adequate. No audible stridor appreciated although pt states it is present. Vocal  quality is minimally hoarse, strong cough. Discussed recommendation of evaluation by outpatient Speech Pathologist for possible paradoxical vocal cord dysfunction (PVCD) and pt given information with explantion of technique pt can use when/if she experiences in the future. Recommend regular diet/thin liquid. No further ST needed as inpatient. SLP Visit Diagnosis: Dysphagia, unspecified (R13.10)    Aspiration Risk  Mild aspiration risk    Diet Recommendation Regular;Thin liquid   Liquid Administration via: Cup;Straw Medication Administration: Whole meds with liquid Supervision: Patient able to self feed Postural Changes: Seated upright at 90 degrees;Remain upright for at least 30 minutes after po intake    Other  Recommendations Oral Care Recommendations: Oral care BID   Follow up Recommendations None      Frequency and Duration            Prognosis        Swallow Study   General Date of Onset: 09/27/20 HPI: 45 year old woman with past medical history of migraines, irritable bowel syndrome - constipation, anxiety, depression, premature menopause and gastritis who presented to [facility] on 09/27/2020 for evaluation of sudden onset of severe coughing, inability to clear throat and shortness of breath. 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 ENTT performed fiberoptic examination which revealed mild edema of arytenoids and slight erythema to both vocal processes and felt presentation was secondary to laryngospasm secondary to reflux with anxiety/functional component with recommendation for twice daily Prilosec, H2 blocker and outpatient follow-up. Type of Study: Bedside Swallow Evaluation Previous Swallow Assessment:  (none) Diet Prior to this Study: NPO Temperature Spikes Noted: No Respiratory Status: Room air History of Recent Intubation: No Behavior/Cognition: Alert;Cooperative;Pleasant mood Oral Cavity Assessment: Within Functional Limits Oral Care  Completed  by SLP: No Oral Cavity - Dentition: Adequate natural dentition Vision: Functional for self-feeding Self-Feeding Abilities: Able to feed self Patient Positioning: Upright in bed Baseline Vocal Quality: Hoarse Volitional Cough: Strong Volitional Swallow: Able to elicit    Oral/Motor/Sensory Function     Ice Chips Ice chips: Not tested   Thin Liquid Thin Liquid: Within functional limits Presentation: Cup;Straw    Nectar Thick Nectar Thick Liquid: Not tested   Honey Thick Honey Thick Liquid: Not tested   Puree Puree: Within functional limits   Solid     Solid: Within functional limits      Royce Macadamia 09/28/2020,2:05 PM  Breck Coons Lonell Face.Ed Nurse, children's [phone] Office [phone]

## 2020-09-28 NOTE — Discharge Summary (Signed)
 Name: Julie Bennett MRN: [id number] DOB: 03/14/1976 45 y.o. PCP: Boyd, Tammy Lamonica, MD  Date of Admission: 09/27/2020  2:57 PM Date of Discharge: 09/28/2020 Attending Physician: Mullen, Emily B, MD  Subjective: Patient evaluated at bedside this AM. She states she's okay this morning, but still having the cough. Says her throat felt as though it was closing up. Otherwise, no dyspnea, chest pain, fevers, chills.  Discharge Diagnosis: 1. Laryngopharyngeal reflux 2. Medication-induced leukocytosis, lactic acidosis 3. Anxiety   Discharge Medications: Allergies as of 09/28/2020   No Known Allergies     Medication List    TAKE these medications   escitalopram 10 MG tablet Commonly known as: LEXAPRO Take 10 mg by mouth daily.   estradiol 2 MG tablet Commonly known as: ESTRACE Take 2 mg by mouth daily.   famotidine 10 MG tablet Commonly known as: PEPCID Take 1 tablet (10 mg total) by mouth at bedtime. Start taking on: Sep 29, 2020   medroxyPROGESTERone 5 MG tablet Commonly known as: PROVERA Take 5 mg by mouth daily.   nortriptyline 10 MG capsule Commonly known as: PAMELOR Take 20 mg by mouth at bedtime.   pantoprazole 40 MG tablet Commonly known as: PROTONIX Take 1 tablet (40 mg total) by mouth 2 (two) times daily. What changed: when to take this   Vitamin D (Ergocalciferol) 1.25 MG (50000 UNIT) Caps capsule Commonly known as: DRISDOL Take 1 capsule by mouth once a week.       Disposition and follow-up:   Julie Bennett was discharged from [facility] in Stable condition.  At the hospital follow up visit please address:   Laryngopharyngeal reflux: Overall work-up negative, most likely reflex vs paradoxical vocal cord dysfunction. Patient started on famotidine, increased dose of Protonix. Patient should see otolaryngology in a few weeks if symptoms persist. Patient also given information for outpatient speech therapist.    Anxiety:  Suspect anxiety playing a secondary role in worsening of symptoms. Can consider further anxiolytic.     Labs / imaging needed at time of follow-up: CBC, BMP   Pending labs/ test needing follow-up: n/a  Follow-up Appointments:  Follow-up Information    Boyd, Tammy Lamonica, MD. Schedule an appointment as soon as possible for a visit in 1 week(s).   Specialty: Family Medicine Contact information: [address] [place] [address] [phone]              Hospital Course by problem list: 1. Laryngopharyngeal reflux: Patient was in usual state of health until she had sudden-onset coughing and sensation of inability to clear throat. This was not precipitated by stress, weather, exercise, insect bite, or strong odor. Originally went to the ED, but due to wait times went to urgent care. There she received IM epinephrine and dexamethasone and EMS called. En route to ED, she was given albuterol, DuoNeb, magnesium, racemic epinephrine, and 750cc bolus of normal saline. In ED, patient was afebrile, tachycardic, otherwise stable on room air. CXR, neck XR, and CT soft tissue neck all negative for masses or swelling. While there she received IV diphenhydramine, IV famotidine, racemic epinephrine, oral lidocaine, potassium, and ceftriaxone. ENT was consulted and performed fiberoptic examination. At that time felt to be 2/2 laryngospasm likely due to reflux with anxiety as secondary component. Overnight patient did well without any acute events. The next morning speech therapist was consulted for further evaluation. It was found patient had only mild aspiration risk with recommendation of a regular diet.   Also recommended further evaluated by outpatient speech pathologist for possible paradoxical vocal cord dysfunction. Discussed these with patient, who verbalized understanding. She is to return to otolaryngology if she continues to have symptoms over the next couple of weeks. She is discharged  with PPI BID and H2 blocker nightly.  2. Medication-induced leukocytosis, lactic acidosis: On arrival to ED, lab work revealed WBC 23 with lactic acidosis at 5. Overall patient was afebrile and did not appear infectious. Prior to these labs being drawn, patient had been given a myriad of medications for her symptoms, likely causing these lab abnormalities. Specifically, the decadron likely led to her leukocytosis. Lactic acidosis likely caused from dehydration in combination with multiple epinephrine administrations. She was also hypokalemic, likely due to albuterol use. Patient to follow-up with PCP within the next week to ensure lab abnormalities normalized.  3. Anxiety: Patient takes escitalopram and nortriptyline at home for chronic anxiety and depression. Throughout hospitalization patient appeared to be very anxious, especially when coughing increased. Most likely anxiety exacerbating some of her symptoms. She could benefit from further anxiolytics to help decrease any further symptoms.  Discharge Exam:   BP 112/68 (BP Location: Right Arm)   Pulse 70   Temp 98.2 F (36.8 C) (Oral)   Resp 16   Ht 5' 5" (1.651 m)   Wt 81.2 kg   LMP 08/14/2013 Comment: signed preg waiver 6.26.2020  SpO2 97%   BMI 29.79 kg/m  General: Laying in bed, no acute distress HENT: Normocephalic, atraumatic. Moist mucous membranes. No foreign objects visible. No swelling of cavity or pharynx. Patent airway. CV: Regular rate, rhythm. No m/r/g appreciated Pulm: Clear to auscultation bilaterally. Normal work of breathing.  Neuro: Awake, alert, answering questions appropriately. Psych: Anxious, normal speech.  Pertinent Labs, Studies, and Procedures:  CBC Latest Ref Rng & Units 09/28/2020 09/27/2020 09/27/2020  WBC 4.0 - 10.5 K/uL 16.5(H) 19.3(H) -  Hemoglobin 12.0 - 15.0 g/dL 11.6(L) 12.3 11.6(L)  Hematocrit 36.0 - 46.0 % 35.2(L) 36.8 34.0(L)  Platelets 150 - 400 K/uL 270 287 -   BMP Latest Ref Rng & Units 09/28/2020  09/27/2020 09/27/2020  Glucose 70 - 99 mg/dL 144(H) 210(H) 210(H)  BUN 6 - 20 mg/dL 8 8 10  Creatinine 0.44 - 1.00 mg/dL 1.11(H) 0.90 1.30(H)  Sodium 135 - 145 mmol/L 138 142 139  Potassium 3.5 - 5.1 mmol/L 4.5 3.0(L) 3.0(L)  Chloride 98 - 111 mmol/L 111 109 110  CO2 22 - 32 mmol/L 18(L) - 15(L)  Calcium 8.9 - 10.3 mg/dL 8.2(L) - 8.1(L)   CXR 5/5: No active cardiopulmonary disease.  Neck XR 5/5: Negative  CT soft tissue neck w/ contrast 5/5: Patent airway. No neck mass or significant swelling.  Discharge Instructions:  Ms. Jakob, I am so glad you are feeling better and can be discharged today. You were admitted because we wanted to rule-out an infectious cause of your symptoms. It does not appear you have an infection and your lab abnormalities have overall corrected. Likely these were due to the medications given to you. We believe your symptoms are caused by vocal cord dysfunction and reflux. Please see the following notes:  Please make an appointment with the speech therapist, Carl. Our speech therapist in the hospital gave you their number to call.  START taking:  Famotidine 10mg daily at bedtime  You will also start taking pantoprazole 40mg twice daily.  Otherwise, you should continue taking your other medications as prescribed. We would like for you to follow-up with   your primary care doctor within the next week.  It was a pleasure meeting you, Ms. Burdi. I wish you the best and hope you stay happy and healthy!  Thank you, Phillip Braswell, MD  Signed: Braswell, Phillip, MD 09/28/2020, 9:46 AM   Pager: [phone] 

## 2020-09-28 NOTE — Progress Notes (Signed)
Patient has order for tele box but, we have ran out from our unit, [facility] notified, he assured me of asking other unit for some telemetry box,  so we can go and collect it and put it on the patient, will continue to monitor.

## 2020-09-28 NOTE — Plan of Care (Signed)
  Problem: Education: Goal: Knowledge of General Education information will improve Description: Including pain rating scale, medication(s)/side effects and non-pharmacologic comfort measures Outcome: Adequate for Discharge   Problem: Education: Goal: Knowledge of General Education information will improve Description: Including pain rating scale, medication(s)/side effects and non-pharmacologic comfort measures Outcome: Adequate for Discharge   

## 2020-10-01 NOTE — Telephone Encounter (Signed)
Work note written

## 2020-10-02 ENCOUNTER — Emergency Department (HOSPITAL_COMMUNITY)
Admission: EM | Admit: 2020-10-02 | Discharge: 2020-10-02 | Disposition: A | Discharge status: Home / Self Care | Payer: BC Managed Care – PPO | Attending: Emergency Medicine | Admitting: Emergency Medicine

## 2020-10-02 ENCOUNTER — Other Ambulatory Visit: Payer: Self-pay

## 2020-10-02 ENCOUNTER — Emergency Department (HOSPITAL_COMMUNITY): Payer: BC Managed Care – PPO

## 2020-10-02 DIAGNOSIS — R531 Weakness: Secondary | ICD-10-CM | POA: Insufficient documentation

## 2020-10-02 DIAGNOSIS — R5383 Other fatigue: Secondary | ICD-10-CM | POA: Insufficient documentation

## 2020-10-02 DIAGNOSIS — R202 Paresthesia of skin: Secondary | ICD-10-CM | POA: Insufficient documentation

## 2020-10-02 DIAGNOSIS — F444 Conversion disorder with motor symptom or deficit: Secondary | ICD-10-CM | POA: Diagnosis not present

## 2020-10-02 DIAGNOSIS — R200 Anesthesia of skin: Secondary | ICD-10-CM

## 2020-10-02 DIAGNOSIS — N3000 Acute cystitis without hematuria: Secondary | ICD-10-CM | POA: Insufficient documentation

## 2020-10-02 DIAGNOSIS — R29818 Other symptoms and signs involving the nervous system: Secondary | ICD-10-CM | POA: Diagnosis not present

## 2020-10-02 DIAGNOSIS — R29898 Other symptoms and signs involving the musculoskeletal system: Secondary | ICD-10-CM

## 2020-10-02 LAB — URINALYSIS, ROUTINE W REFLEX MICROSCOPIC
Bilirubin Urine: NEGATIVE
Glucose, UA: NEGATIVE mg/dL
Ketones, ur: NEGATIVE mg/dL
Nitrite: NEGATIVE
Protein, ur: NEGATIVE mg/dL
Specific Gravity, Urine: 1.016 (ref 1.005–1.030)
pH: 7 (ref 5.0–8.0)

## 2020-10-02 LAB — DIFFERENTIAL
Abs Immature Granulocytes: 0.12 10*3/uL — ABNORMAL HIGH (ref 0.00–0.07)
Basophils Absolute: 0.1 10*3/uL (ref 0.0–0.1)
Basophils Relative: 1 %
Eosinophils Absolute: 0.2 10*3/uL (ref 0.0–0.5)
Eosinophils Relative: 2 %
Immature Granulocytes: 1 %
Lymphocytes Relative: 29 %
Lymphs Abs: 2.9 10*3/uL (ref 0.7–4.0)
Monocytes Absolute: 0.8 10*3/uL (ref 0.1–1.0)
Monocytes Relative: 8 %
Neutro Abs: 5.8 10*3/uL (ref 1.7–7.7)
Neutrophils Relative %: 59 %

## 2020-10-02 LAB — I-STAT CHEM 8, ED
BUN: 13 mg/dL (ref 6–20)
Calcium, Ion: 1.14 mmol/L — ABNORMAL LOW (ref 1.15–1.40)
Chloride: 103 mmol/L (ref 98–111)
Creatinine, Ser: 1.2 mg/dL — ABNORMAL HIGH (ref 0.44–1.00)
Glucose, Bld: 80 mg/dL (ref 70–99)
HCT: 39 % (ref 36.0–46.0)
Hemoglobin: 13.3 g/dL (ref 12.0–15.0)
Potassium: 3.8 mmol/L (ref 3.5–5.1)
Sodium: 139 mmol/L (ref 135–145)
TCO2: 27 mmol/L (ref 22–32)

## 2020-10-02 LAB — CBC
HCT: 42 % (ref 36.0–46.0)
Hemoglobin: 14.1 g/dL (ref 12.0–15.0)
MCH: 31.3 pg (ref 26.0–34.0)
MCHC: 33.6 g/dL (ref 30.0–36.0)
MCV: 93.1 fL (ref 80.0–100.0)
Platelets: 288 10*3/uL (ref 150–400)
RBC: 4.51 MIL/uL (ref 3.87–5.11)
RDW: 12.3 % (ref 11.5–15.5)
WBC: 9.8 10*3/uL (ref 4.0–10.5)
nRBC: 0 % (ref 0.0–0.2)

## 2020-10-02 LAB — CBG MONITORING, ED: Glucose-Capillary: 102 mg/dL — ABNORMAL HIGH (ref 70–99)

## 2020-10-02 LAB — I-STAT BETA HCG BLOOD, ED (MC, WL, AP ONLY): I-stat hCG, quantitative: <5 m[IU]/mL (ref <5)

## 2020-10-02 LAB — COMPREHENSIVE METABOLIC PANEL
ALT: 19 U/L (ref 0–44)
AST: 16 U/L (ref 15–41)
Albumin: 3.5 g/dL (ref 3.5–5.0)
Alkaline Phosphatase: 88 U/L (ref 38–126)
Anion gap: 6 (ref 5–15)
BUN: 11 mg/dL (ref 6–20)
CO2: 26 mmol/L (ref 22–32)
Calcium: 8.8 mg/dL — ABNORMAL LOW (ref 8.9–10.3)
Chloride: 105 mmol/L (ref 98–111)
Creatinine, Ser: 1.24 mg/dL — ABNORMAL HIGH (ref 0.44–1.00)
GFR, Estimated: 55 mL/min — ABNORMAL LOW (ref >60)
Glucose, Bld: 85 mg/dL (ref 70–99)
Potassium: 3.8 mmol/L (ref 3.5–5.1)
Sodium: 137 mmol/L (ref 135–145)
Total Bilirubin: 0.3 mg/dL (ref 0.3–1.2)
Total Protein: 6.3 g/dL — ABNORMAL LOW (ref 6.5–8.1)

## 2020-10-02 LAB — CULTURE, BLOOD (SINGLE)
Culture: NO GROWTH
Special Requests: ADEQUATE

## 2020-10-02 LAB — PROTIME-INR
INR: 0.9 (ref 0.8–1.2)
Prothrombin Time: 12.5 seconds (ref 11.4–15.2)

## 2020-10-02 LAB — APTT: aPTT: 26 seconds (ref 24–36)

## 2020-10-02 IMAGING — MR MR HEAD W/O CM
4 series · 48 of 48 positions shown · non-contrast
Comparison: Brain MRI [DATE].

CLINICAL DATA: 44-year-old female code stroke presentation. Limited
MRI requested by Neurology.

EXAM:
MRI HEAD WITHOUT CONTRAST
TECHNIQUE: DWI only exam without intravenous contrast at the request of Stroke
Neurologist.

[Series 3: DWI · axial · 3.0mm · 1.09mm/px · z∈[-42,+85]mm · 18 of 90 slices shown (1 of 4)]
[im 1/90]
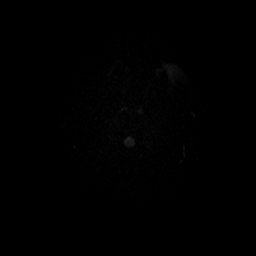
[im 6/90]
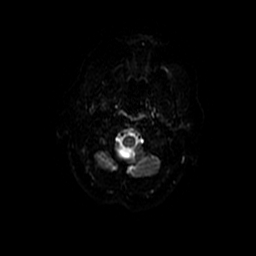
[im 11/90]
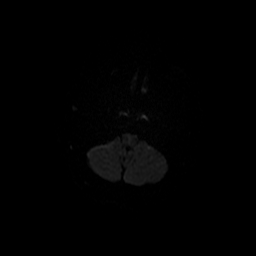
[im 16/90]
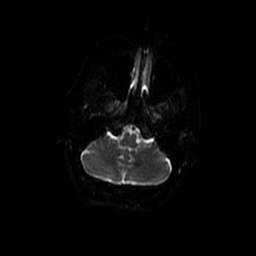
[im 21/90]
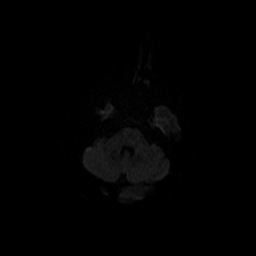
[im 27/90]
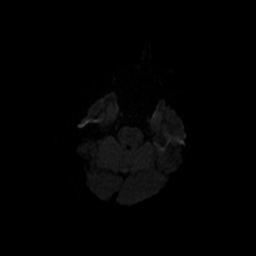
[im 32/90]
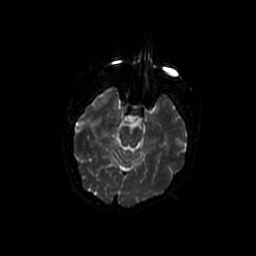
[im 37/90]
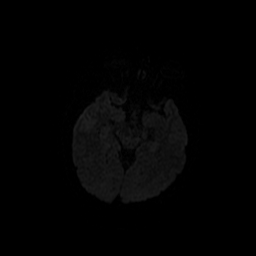
[im 42/90]
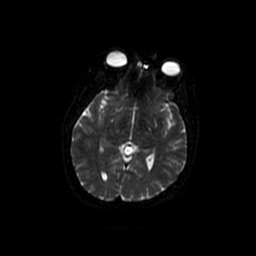
[im 48/90]
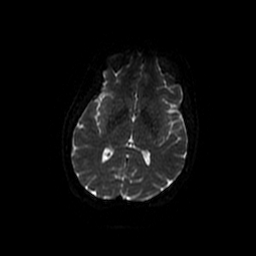
[im 53/90]
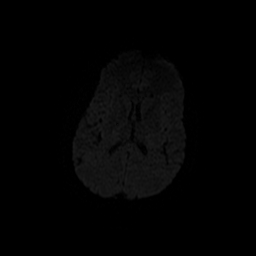
[im 58/90]
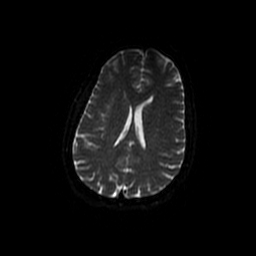
[im 63/90]
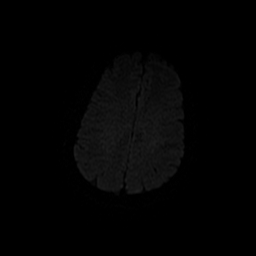
[im 69/90]
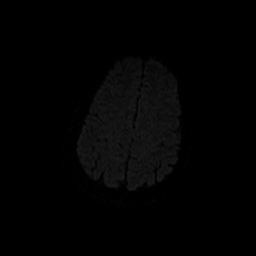
[im 74/90]
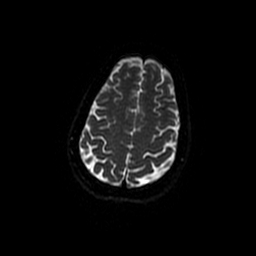
[im 79/90]
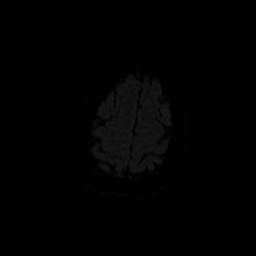
[im 84/90]
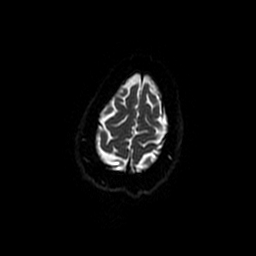
[im 90/90]
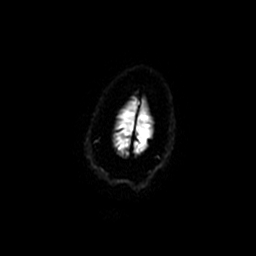

[Series 4: DWI · coronal · 5.0mm · 1.09mm/px · 14 of 66 slices shown (2 of 4)]
[im 1/66]
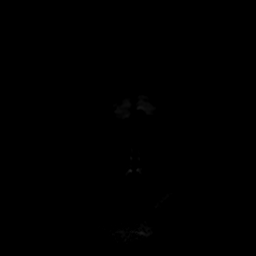
[im 6/66]
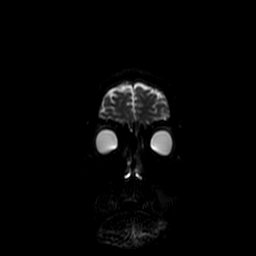
[im 11/66]
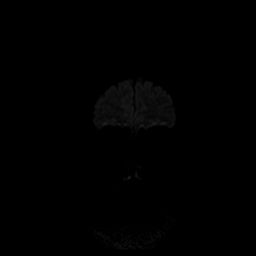
[im 16/66]
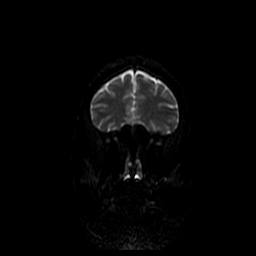
[im 21/66]
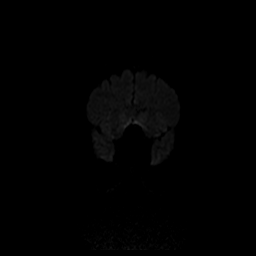
[im 26/66]
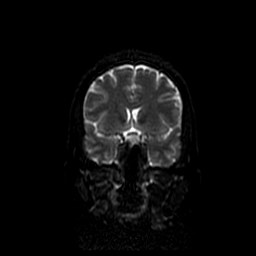
[im 31/66]
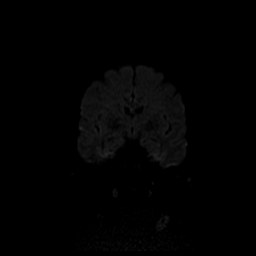
[im 36/66]
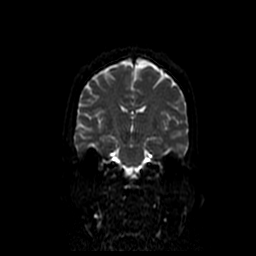
[im 41/66]
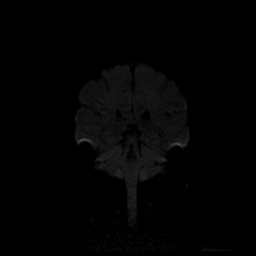
[im 46/66]
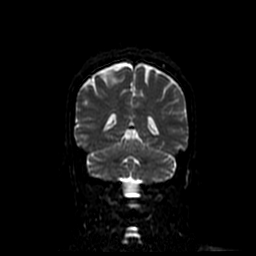
[im 51/66]
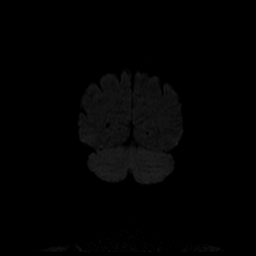
[im 56/66]
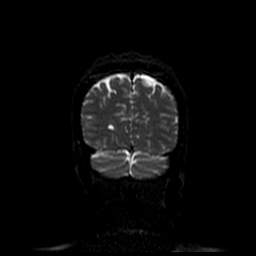
[im 61/66]
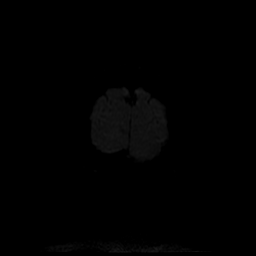
[im 66/66]
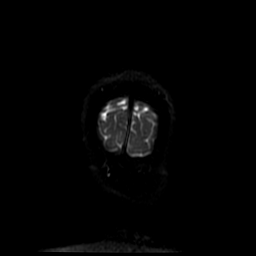

[Series 300: DWI · axial · 3.0mm · 1.09mm/px · z∈[-42,+85]mm · 9 of 45 slices shown (3 of 4)]
[im 1/45]
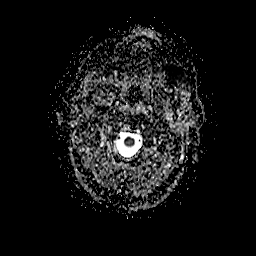
[im 6/45]
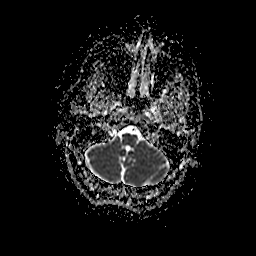
[im 12/45]
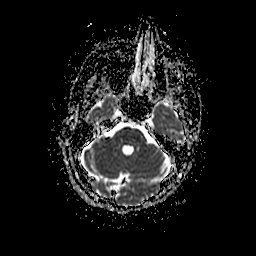
[im 17/45]
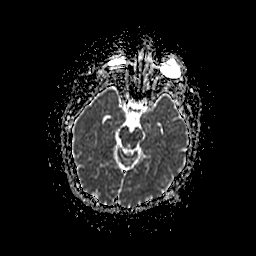
[im 23/45]
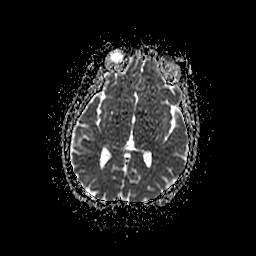
[im 28/45]
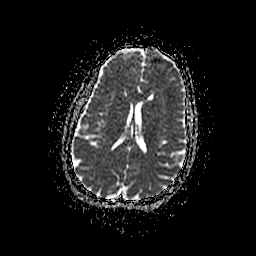
[im 34/45]
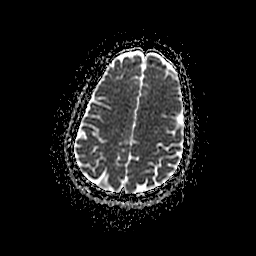
[im 39/45]
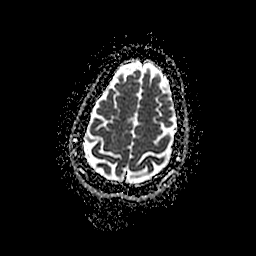
[im 45/45]
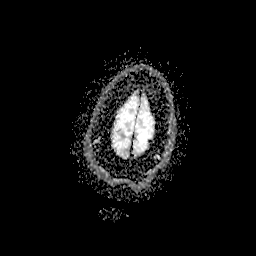

[Series 400: DWI · coronal · 5.0mm · 1.09mm/px · 7 of 33 slices shown (4 of 4)]
[im 1/33]
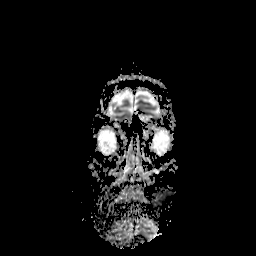
[im 6/33]
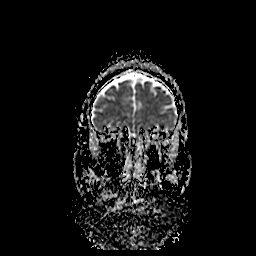
[im 11/33]
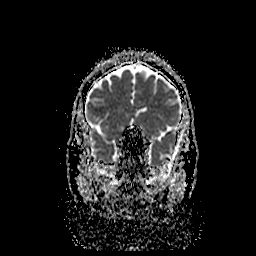
[im 17/33]
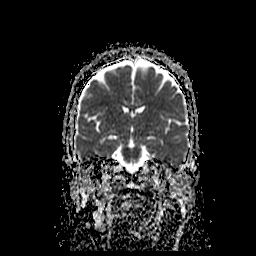
[im 22/33]
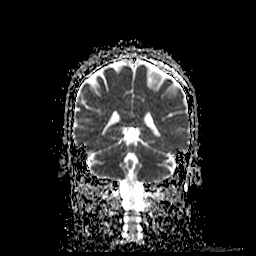
[im 27/33]
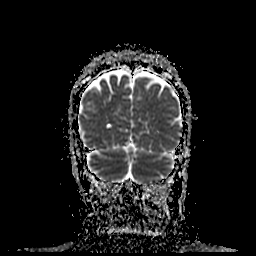
[im 33/33]
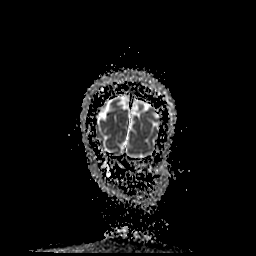

[48 of 48 positions shown; findings below may reference images not displayed]

FINDINGS: No restricted diffusion to suggest acute infarction.

Stable cerebral volume since [YP], within normal limits. No midline
shift, mass effect, or evidence of intracranial mass lesion. No
ventriculomegaly.
IMPRESSION: Negative for acute infarct.

This was discussed by telephone with Dr. ADNYANA on [DATE] at
[YP] hours.

## 2020-10-02 IMAGING — MR MR HEAD WO/W CM
5 of 9 series · 19 of 48 positions shown · IV contrast (8     GAD)
Comparison: None.

CLINICAL DATA: Neuro deficit, acute, stroke suspected. Left arm and
leg weakness

EXAM:
MRI HEAD WITHOUT AND WITH CONTRAST
TECHNIQUE: Multiplanar, multiecho pulse sequences of the brain and surrounding
structures were obtained without and with intravenous contrast.
CONTRAST:  8mL GADAVIST GADOBUTROL 1 MMOL/ML IV SOLN

[Series 6: T2 · axial · 5.0mm · 0.43mm/px · 1 of 24 slices shown]
[im 1/24]
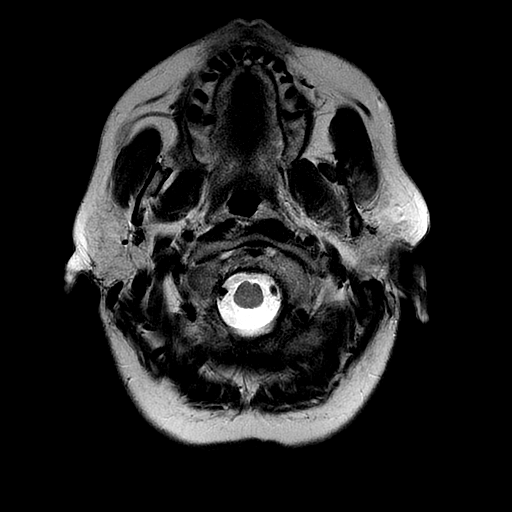

[Series 7: FLAIR · axial · 3.0mm · 0.43mm/px · z∈[-83,+39]mm · 2 of 12 slices shown (1 of 2)]
[im 1/12]
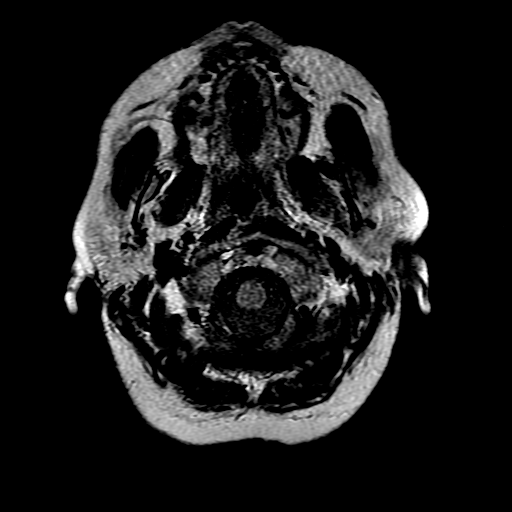
[im 12/12]
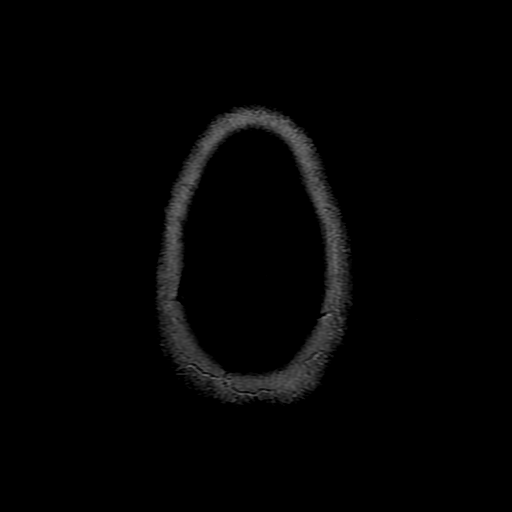

[Series 8: FLAIR · axial · 3.0mm · 0.43mm/px · z∈[-83,+45]mm · 4 of 24 slices shown (2 of 2)]
[im 1/24]
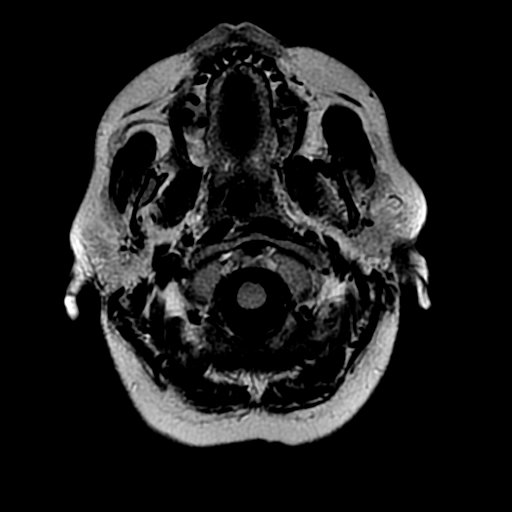
[im 8/24]
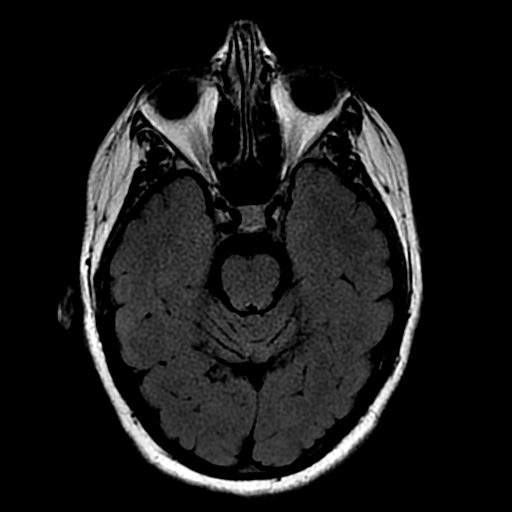
[im 16/24]
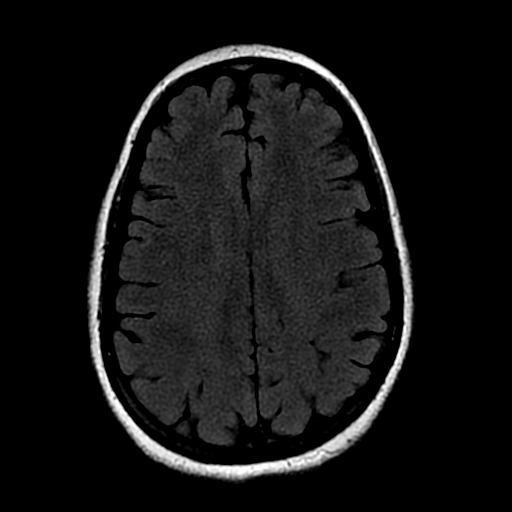
[im 24/24]
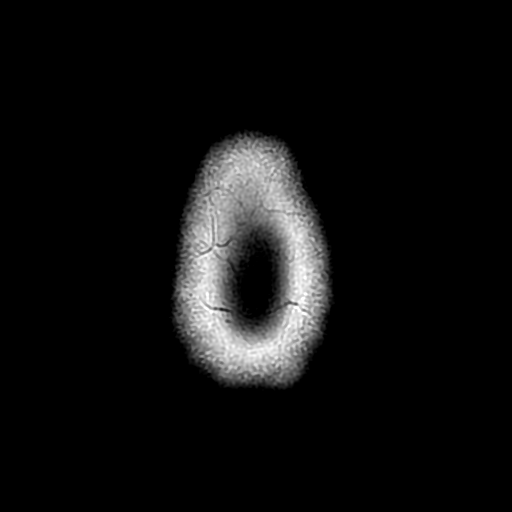

[Series 12: T1 post-contrast · axial · 3.0mm · 0.47mm/px · z∈[-87,+51]mm · 8 of 50 slices shown (1 of 2)]
[im 1/50]
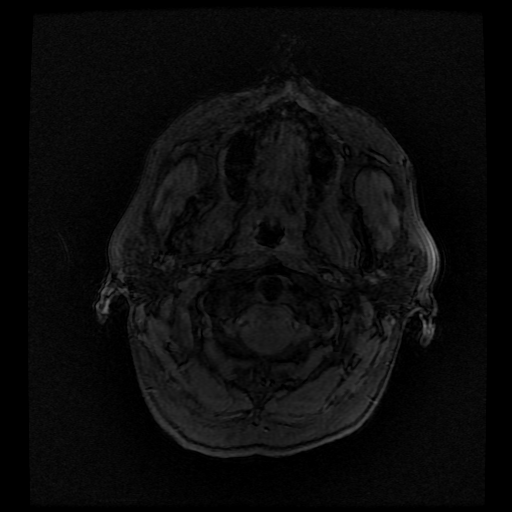
[im 8/50]
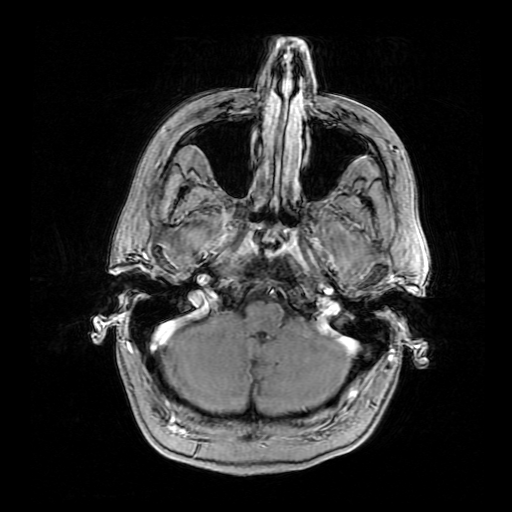
[im 15/50]
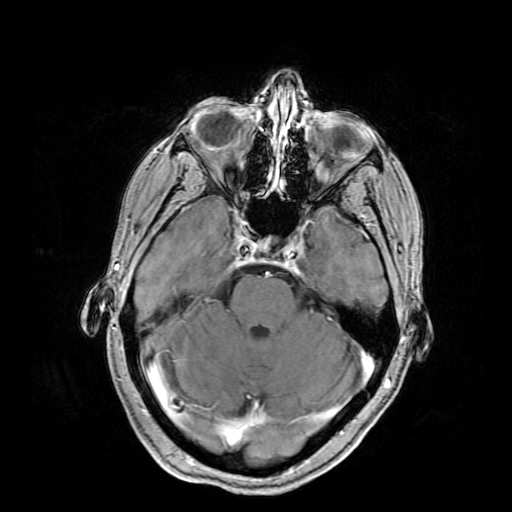
[im 22/50]
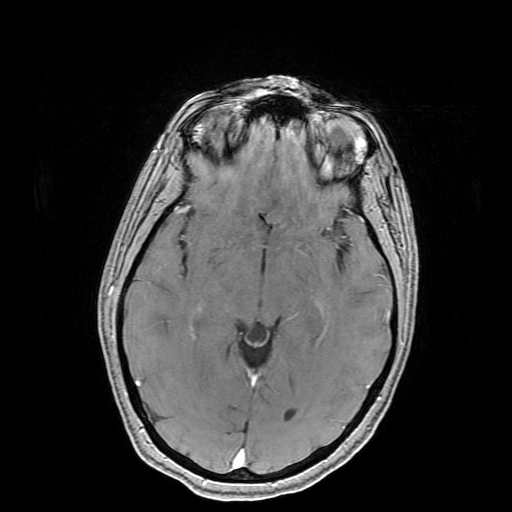
[im 29/50]
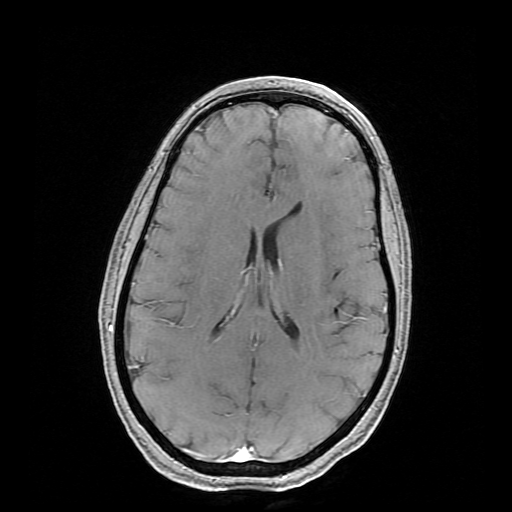
[im 36/50]
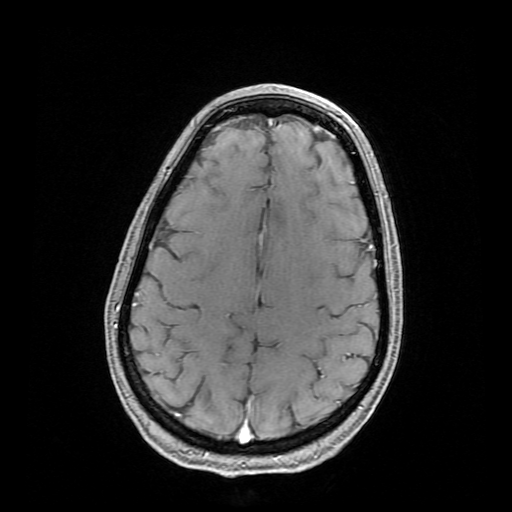
[im 43/50]
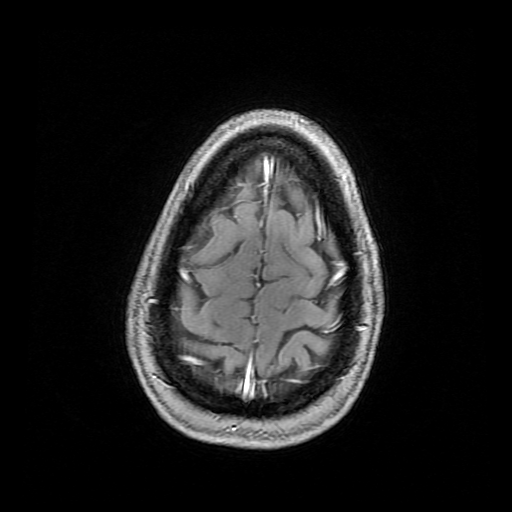
[im 50/50]
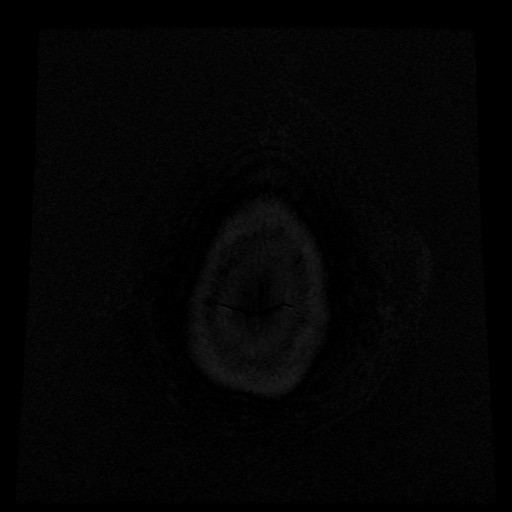

[Series 13: T1 post-contrast · coronal · 5.0mm · 0.39mm/px · 4 of 25 slices shown (2 of 2)]
[im 1/25]
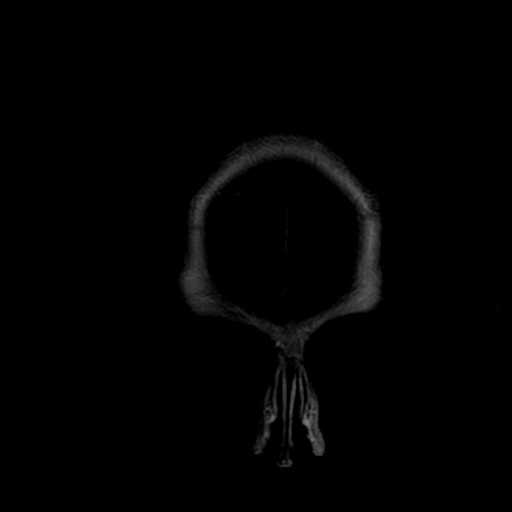
[im 9/25]
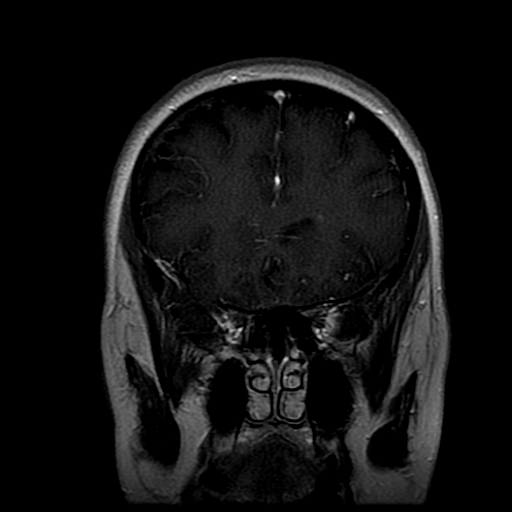
[im 17/25]
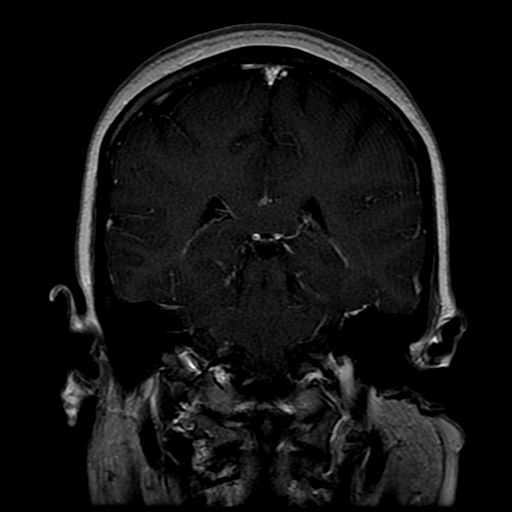
[im 25/25]
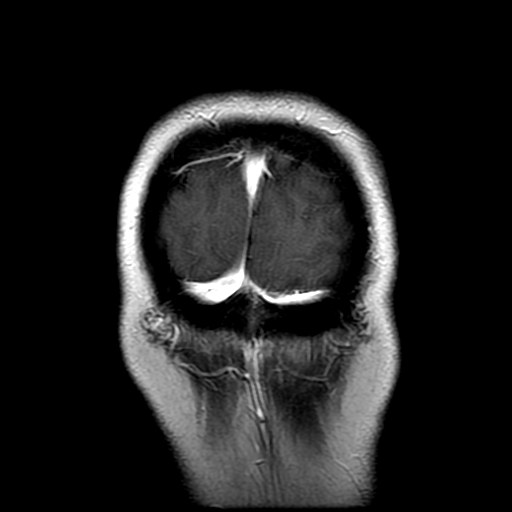

[19 of 48 positions shown; findings below may reference images not displayed]

FINDINGS: Brain: No acute infarction, hemorrhage, hydrocephalus or extra-axial
collection. The brain parenchyma has normal morphology and signal
characteristics.

Incidentally noted is a 12 x 9 mm pineal cyst.

Vascular: Normal flow voids.

Skull and upper cervical spine: Normal marrow signal.

Sinuses/Orbits: Negative.

Other: None.
IMPRESSION: 1. No acute intracranial abnormality.
2. Incidental 12 mm pineal cyst.

## 2020-10-02 MED ORDER — GADOBUTROL 1 MMOL/ML IV SOLN
8.0000 mL | Freq: Once | INTRAVENOUS | Status: AC | PRN
  Administered 2020-10-02: 8 mL via INTRAVENOUS

## 2020-10-02 MED ORDER — CEPHALEXIN 500 MG PO CAPS: 500.0000 mg | ORAL_CAPSULE | Freq: Four times a day (QID) | ORAL | 0 refills | Status: AC

## 2020-10-02 MED ORDER — LORAZEPAM 2 MG/ML IJ SOLN
1.0000 mg | Freq: Once | INTRAMUSCULAR | Status: AC
  Administered 2020-10-02: 1 mg via INTRAVENOUS
  Filled 2020-10-02 (×2): qty 1

## 2020-10-02 MED ORDER — SODIUM CHLORIDE 0.9% FLUSH
3.0000 mL | Freq: Once | INTRAVENOUS | Status: AC
  Administered 2020-10-02: 3 mL via INTRAVENOUS

## 2020-10-02 NOTE — ED Triage Notes (Signed)
BIB [facility] after coworker of pt called to report pt having increase lightheadness and left sided weakness while at work. Per EMS, pt c/o heaviness in in left side, weak left hand grip, and decrease coordination.

## 2020-10-02 NOTE — ED Notes (Signed)
Code stroke cancelled per MD Sal

## 2020-10-02 NOTE — Discharge Instructions (Signed)
Your work-up today was overall reassuring in regards to the imaging of your head.  There is no evidence of stroke, MS, or tumors.  The neurology team did not feel this was a stroke or TIA.  The work-up did reveal a urinary tract infection so please take the antibiotic for the next week.  Please rest and stay hydrated and follow-up with outpatient neurology team.  Please rest.  If any symptoms change or worsen, please return to the nearest emergency department.

## 2020-10-02 NOTE — Consult Note (Signed)
Neurology consult   CC: code stroke  History is obtained from: EMS and patient  HPI: Ms. Julie Bennett is a 45 yo female with a PMHx of refractory migraines, early menopause, depression, and anxiety who presents today as a code stroke. Story per EMS, patient was driving to her job this a.m., and at 0615 hours, began to experience left leg heaviness and decreased sensation of LUE. Per EMS, on arrival to scene, patient was clumsy with walking with decreased sensation findings for the LUE. BP 130/90 and CBG 165 in the field.   No personal history of stroke. No FMHx of stroke. No HA.   In review of chart, her last stay at [facility] on 09/27/20, she was worked up with findings of laryngopharyngeal reflux and discharged after a one day stay. Anxiety diagnosis was mentioned in discharge summary as playing a role in her worsening reflux symptoms. Her Lexapro and Nortriptyline were continued. In 2019, she was seen in the ED for chest pain and workup was unremarkable and patient was discharged from the ED.  After brief exam at the ED bridge, patient was taken emergently for a stat MRI brain. 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 MRI brain was negative for stroke.    LKW:  0615 hours tpa given?: No, not a candidate IR Thrombectomy?: No not a candidate.  MRS: 0  NIHSS:  1a Level of Consciousness: 0 1b LOC Questions: 0 1c LOC Commands: 0 2 Best Gaze: 0 3 Visual: 0 4 Facial Palsy: 0 5a Motor Arm - left: 0 5b Motor Arm - Right: 0 6a Motor Leg - Left: 1 6b Motor Leg - Right: 0 7 Limb Ataxia: 0 8 Sensory: 1 9 Best Language: 0 10 Dysarthria: 0 11 Extinction and Inattention: 1 TOTAL:  3  ROS: A robust ROS was not performed due to emergent nature of event.   Past Medical History:  Diagnosis Date  . Amenorrhea   . Anxiety   . Depression   . Headache   . 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    Family History  Problem Relation Age of Onset  .  Ovarian cancer Mother   . Throat cancer Father     Social History:  reports that she has never smoked. She has never used smokeless tobacco. She reports that she does not drink alcohol and does not use drugs.   Prior to Admission medications   Medication Sig Start Date End Date Taking? Authorizing Provider  escitalopram (LEXAPRO) 10 MG tablet Take 10 mg by mouth daily.    [provider]  estradiol (ESTRACE) 2 MG tablet Take 2 mg by mouth daily. 07/28/20   [provider]  famotidine (PEPCID) 10 MG tablet Take 1 tablet (10 mg total) by mouth at bedtime. 09/29/20   Evlyn Kanner, MD  medroxyPROGESTERone (PROVERA) 5 MG tablet Take 5 mg by mouth daily. 03/11/19   [provider]  nortriptyline (PAMELOR) 10 MG capsule Take 20 mg by mouth at bedtime. 08/13/20   [provider]  pantoprazole (PROTONIX) 40 MG tablet Take 1 tablet (40 mg total) by mouth 2 (two) times daily. 09/28/20   Evlyn Kanner, MD  Vitamin D, Ergocalciferol, (DRISDOL) 1.25 MG (50000 UNIT) CAPS capsule Take 1 capsule by mouth once a week. 07/12/20   [provider]    Exam: Current vital signs: BP 136/90   Pulse 89   Wt 81.2 kg   LMP 08/14/2013 Comment: signed preg waiver 6.26.2020  BMI 29.79 kg/m   Physical Exam  Constitutional: Appears well-developed and well-nourished.  Psych: Affect is flat and withdrawn.  Eyes: No scleral injection HENT: No OP obstrucion Head: Normocephalic.  Cardiovascular: Normal rate and regular rhythm.  Respiratory: Effort normal. GI: Soft.  Skin: WDI  Neuro: Mental Status: Patient is awake, alert, oriented to person, place, month, year, and situation. Patient is able to give a clear and coherent history. No signs of neglect. Speech/Language:  Speech is clear, fluent without dysarthria or aphasia. Repetition, naming, and comprehension intact.  Cranial Nerves: II: Visual Fields are full. Pupils are equal, round, and reactive to light.   III,IV, VI: EOMI without ptosis or diploplia.  V: Facial sensation is symmetric to light touch in V1, V2, and V3 VII: Patient attempts to mimic a facial droop, but this is not present when talking.   VIII: hearing is intact to voice. X: Uvula elevates symmetrically. XI: Shoulder shrug is symmetric. XII: tongue is midline without atrophy or fasciculations.  Motor: RUE:  grips  5/5     biceps  5/5     triceps  5/5 LUE:  grips  4-/5    biceps  4-/5     triceps  4-/5 RLE: poor effort, able to lift in air for 5 seconds.  LLE:  3/5 (some effort against gravity, but exam is inconsistent). Unable to hold LLE in air for 5 seconds.  Tone is normal. Bulk is normal.  Sensory: Sensation is symmetric to light touch in all fours extremities but decreased in LUE. Extinction present on LUE to light touch.  Plantars: Toes are downgoing bilaterally.  Cerebellar: No ataxia noted with FNF. Unable to perform HKS on LLE.   MD reviewed the images obtained:  MRI brain Negative for acute infarct.  Assessment: 45 yo female who presented as a code stroke for left lower extremity weakness and LUE sensory loss. Because her exam was inconsistent, there was suspicion for a functional presentation. Patient taken straight to MRI brain which was negative for acute finding. 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   Impression:  1. 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   Plan: -no further neurological work up needed.  -f/up with PCP as may want to consider increased dose of Lexapro and/or psychotherapy.  -discharge decision per ED MD.   Electronically signed by: Jimmye Norman, MSN, APN-BC, nurse practitioner and by MD. Note/plan to be edited by MD as needed.   Pager: [phone]

## 2020-10-02 NOTE — ED Notes (Signed)
Pt was able to ambulate to the restroom. Pt complained of legs being heavy.

## 2020-10-02 NOTE — ED Notes (Signed)
Pt transported to MRI 

## 2020-10-02 NOTE — Code Documentation (Signed)
Stroke Response Nurse Documentation Code Stroke Documentation Julie Bennett  is a 45 y.o.  y.o. female  arriving to [facility] ED via [facility] on 10/02/2020 with PMH of Depression, Anxiety, Migraines. Code stroke was activated by EMS. 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 Patient taking No antithrombotic PTA. Stroke team at the bedside on patient arrival, labs drawn and patient cleared for CT by Dr. Rush Landmark. Patient taken to MRI with team. NIHSS 6, see documentation for details and code stroke times. Patient with left facial droop, left arm weakness, left leg weakness, left decreased sensation and Sensory  neglect on exam. The following imaging was completed:  MRI. Patient is not a candidate for tPA due to negative MRI. Will cancel code stroke per Dr. Derry Lory. Bedside handoff with ED RN Victorino Dike.    Esmond Camper  Stroke Response RN [phone]

## 2020-10-02 NOTE — ED Provider Notes (Signed)
[facility] EMERGENCY DEPARTMENT Provider Note   CSN: [id number] Arrival date & time:     An emergency department physician performed an initial assessment on this suspected stroke patient at [phone].  History Chief Complaint  Patient presents with  . Code Stroke    Julie Bennett is a 45 y.o. female.  The history is provided by the patient and medical records. No language interpreter was used.  Neurologic Problem This is a new problem. The current episode started 1 to 2 hours ago. The problem occurs constantly. The problem has not changed since onset.Pertinent negatives include no chest pain, no abdominal pain, no headaches and no shortness of breath. Nothing aggravates the symptoms. Nothing relieves the symptoms. She has tried nothing for the symptoms. The treatment provided no relief.       Past Medical History:  Diagnosis Date  . Amenorrhea   . Anxiety   . Depression   . Headache   . 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 Laryngospasm 09/27/2020  . 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 Premature menopause 05/23/2016  . Amenorrhea 05/21/2016  . Bacterial vaginosis 05/21/2016  . Ovarian cyst 03/18/2014    Past Surgical History:  Procedure Laterality Date  . DILATION AND CURETTAGE OF UTERUS    . hsyteroscopy    . LAPAROSCOPY    . TUBAL LIGATION       OB History   No obstetric history on file.     Family History  Problem Relation Age of Onset  . Ovarian cancer Mother   . Throat cancer Father     Social History   Tobacco Use  . Smoking status: Never Smoker  . Smokeless tobacco: Never Used  Substance Use Topics  . Alcohol use: No  . Drug use: No    Home Medications Prior to Admission medications   Medication Sig Start Date End Date Taking? Authorizing Provider  escitalopram (LEXAPRO) 10 MG tablet Take 10 mg by mouth daily.    [provider]  estradiol (ESTRACE) 2 MG tablet  Take 2 mg by mouth daily. 07/28/20   [provider]  famotidine (PEPCID) 10 MG tablet Take 1 tablet (10 mg total) by mouth at bedtime. 09/29/20   Evlyn Kanner, MD  medroxyPROGESTERone (PROVERA) 5 MG tablet Take 5 mg by mouth daily. 03/11/19   [provider]  nortriptyline (PAMELOR) 10 MG capsule Take 20 mg by mouth at bedtime. 08/13/20   [provider]  pantoprazole (PROTONIX) 40 MG tablet Take 1 tablet (40 mg total) by mouth 2 (two) times daily. 09/28/20   Evlyn Kanner, MD  Vitamin D, Ergocalciferol, (DRISDOL) 1.25 MG (50000 UNIT) CAPS capsule Take 1 capsule by mouth once a week. 07/12/20   [provider]    Allergies    Patient has no known allergies.  Review of Systems   Review of Systems  Constitutional: Negative for chills, diaphoresis, fatigue and fever.  HENT: Positive for congestion.   Eyes: Negative for visual disturbance.  Respiratory: Negative for cough, chest tightness, shortness of breath and wheezing.   Cardiovascular: Negative for chest pain, palpitations and leg swelling.  Gastrointestinal: Negative for abdominal pain, constipation, diarrhea, nausea and vomiting.  Genitourinary: Negative for dysuria, flank pain and frequency.  Musculoskeletal: Negative for back pain, neck pain and neck stiffness.  Skin: Negative for rash and wound.  Neurological: Positive for weakness and numbness. Negative for dizziness, seizures, speech difficulty, light-headedness and  headaches.  Psychiatric/Behavioral: Negative for agitation and confusion.  All other systems reviewed and are negative.   Physical Exam Updated Vital Signs BP 136/90   Pulse 89   Wt 81.2 kg   LMP 08/14/2013 Comment: signed preg waiver 6.26.2020  BMI 29.79 kg/m   Physical Exam Vitals and nursing note reviewed.  Constitutional:      General: She is not in acute distress.    Appearance: She is well-developed. She is not ill-appearing, toxic-appearing or diaphoretic.   HENT:     Head: Normocephalic and atraumatic.     Nose: No congestion or rhinorrhea.     Mouth/Throat:     Mouth: Mucous membranes are moist.     Pharynx: No oropharyngeal exudate or posterior oropharyngeal erythema.  Eyes:     Extraocular Movements: Extraocular movements intact.     Conjunctiva/sclera: Conjunctivae normal.     Pupils: Pupils are equal, round, and reactive to light.  Cardiovascular:     Rate and Rhythm: Normal rate and regular rhythm.     Heart sounds: No murmur heard.   Pulmonary:     Effort: Pulmonary effort is normal. No respiratory distress.     Breath sounds: Normal breath sounds. No wheezing, rhonchi or rales.  Chest:     Chest wall: No tenderness.  Abdominal:     General: Abdomen is flat.     Palpations: Abdomen is soft.     Tenderness: There is no abdominal tenderness. There is no left CVA tenderness or rebound.  Musculoskeletal:        General: No tenderness.     Cervical back: Neck supple. No tenderness.  Skin:    General: Skin is warm and dry.     Findings: No erythema.  Neurological:     Mental Status: She is alert and oriented to person, place, and time.     Sensory: Sensory deficit present.     Motor: Weakness and abnormal muscle tone present.     Comments: Numbness and weakness in left arm and left leg compared to right side.  No facial droop.  Clear speech.  Pupils normal extraocular movements and reactions.  Psychiatric:        Mood and Affect: Mood normal.     ED Results / Procedures / Treatments   Labs (all labs ordered are listed, but only abnormal results are displayed) Labs Reviewed  DIFFERENTIAL - Abnormal; Notable for the following components:      Result Value   Abs Immature Granulocytes 0.12 (*)    All other components within normal limits  COMPREHENSIVE METABOLIC PANEL - Abnormal; Notable for the following components:   Creatinine, Ser 1.24 (*)    Calcium 8.8 (*)    Total Protein 6.3 (*)    GFR, Estimated 55 (*)    All  other components within normal limits  URINALYSIS, ROUTINE W REFLEX MICROSCOPIC - Abnormal; Notable for the following components:   APPearance HAZY (*)    Hgb urine dipstick SMALL (*)    Leukocytes,Ua LARGE (*)    Bacteria, UA FEW (*)    All other components within normal limits  I-STAT CHEM 8, ED - Abnormal; Notable for the following components:   Creatinine, Ser 1.20 (*)    Calcium, Ion 1.14 (*)    All other components within normal limits  CBG MONITORING, ED - Abnormal; Notable for the following components:   Glucose-Capillary 102 (*)    All other components within normal limits  URINE CULTURE  PROTIME-INR  APTT  CBC  I-STAT BETA HCG BLOOD, ED ([facility], [facility], [facility] ONLY)    EKG EKG Interpretation  Date/Time:  Tuesday Oct 02 2020 09:21:02 EDT Ventricular Rate:  83 PR Interval:  139 QRS Duration: 96 QT Interval:  380 QTC Calculation: 447 R Axis:   35 Text Interpretation: Sinus rhythm Low voltage, precordial leads When compared to prior, faster rate. No sTEMI Confirmed by Theda Belfast ([id number]) on 10/02/2020 9:49:49 AM   Radiology MR BRAIN WO CONTRAST  Result Date: 10/02/2020 CLINICAL DATA:  45 year old female code stroke presentation. Limited MRI requested by Neurology. EXAM: MRI HEAD WITHOUT CONTRAST TECHNIQUE: DWI only exam without intravenous contrast at the request of Stroke Neurologist. COMPARISON:  Brain MRI 09/11/2016. FINDINGS: No restricted diffusion to suggest acute infarction. Stable cerebral volume since 2018, within normal limits. No midline shift, mass effect, or evidence of intracranial mass lesion. No ventriculomegaly. IMPRESSION: Negative for acute infarct. This was discussed by telephone with Dr. Derry Lory on 10/02/2020 at 0911 hours. Electronically Signed   By: Odessa Fleming M.D.   On: 10/02/2020 09:24   MR Brain W and Wo Contrast  Result Date: 10/02/2020 CLINICAL DATA:  Neuro deficit, acute, stroke suspected. Left arm and leg weakness EXAM: MRI HEAD WITHOUT AND WITH  CONTRAST TECHNIQUE: Multiplanar, multiecho pulse sequences of the brain and surrounding structures were obtained without and with intravenous contrast. CONTRAST:  41mL GADAVIST GADOBUTROL 1 MMOL/ML IV SOLN COMPARISON:  None. FINDINGS: Brain: No acute infarction, hemorrhage, hydrocephalus or extra-axial collection. The brain parenchyma has normal morphology and signal characteristics. 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 Vascular: Normal flow voids. Skull and upper cervical spine: Normal marrow signal. Sinuses/Orbits: Negative. Other: None. IMPRESSION: 1. No acute intracranial abnormality. 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 Electronically Signed   By: Baldemar Lenis M.D.   On: 10/02/2020 12:50    Procedures Procedures   Medications Ordered in ED Medications  sodium chloride flush (NS) 0.9 % injection 3 mL (3 mLs Intravenous Given 10/02/20 0840)  LORazepam (ATIVAN) injection 1 mg (1 mg Intravenous Given 10/02/20 1055)  gadobutrol (GADAVIST) 1 MMOL/ML injection 8 mL (8 mLs Intravenous Contrast Given 10/02/20 1231)    ED Course  I have reviewed the triage vital signs and the nursing notes.  Pertinent labs & imaging results that were available during my care of the patient were reviewed by me and considered in my medical decision making (see chart for details).    MDM Rules/Calculators/A&P                          JENNIEFER SALAK is a 45 y.o. female with a past medical history significant for depression, anxiety, and recent laryngospasm related to reflux who presents as a code stroke.  According to patient, around 6:15 AM this morning, she was walking into school when she noticed fatigue and felt her left leg and left arm were weaker.  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  Patient arrived as a code stroke for left-sided deficits.  I screened patient on arrival and her airway is intact.  Patient  taken to MRI per neurology recommendations for evaluation.  MRI initially did not show evidence of acute stroke and neurology suspects that symptoms are not TIA or stroke related.  They think the symptoms are more functional in nature.  Patient does report to  me that she had dysuria yesterday and wanted to be evaluated for UTI.  On my reassessment, she is having persistent left arm and left leg numbness and weakness so neurology does feel that full MRI brain with and without contrast is reasonable to look for demyelinating or other significant abnormalities.  She is not having any headache to make a suspected complicated migraine.  When MRIs have been completed and patient is reassessed, anticipate discussion with neurology about disposition.      MRI repeat does not show evidence of stroke, tumor, or other acute abnormality.  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  Patient's urinalysis does show notes of UTI.  Based on neurology's reassuring examination and recommendations, patient will be discharged home with plans to follow-up with neurology.  We will treat UTI with antibiotics.  Otherwise work-up reassuring.  Patient agrees with plan of care and was discharged in good conditions to maintain hydration at home.  She had no other questions or concerns and was discharged.    Final Clinical Impression(s) / ED Diagnoses Final diagnoses:  Acute cystitis without hematuria  Transient neurological symptoms  Numbness and tingling of left arm and leg  Weakness of left arm  Weakness of left leg    Rx / DC Orders ED Discharge Orders         Ordered    cephALEXin (KEFLEX) 500 MG capsule  4 times daily        10/02/20 1547          Clinical Impression: 1. Acute cystitis without hematuria   2. Transient neurological symptoms   3. Numbness and tingling of left arm and leg   4. Weakness of left arm   5. Weakness of left leg     Disposition:  Discharge  Condition: Good  I have discussed the results, Dx and Tx plan with the pt(& family if present). He/she/they expressed understanding and agree(s) with the plan. Discharge instructions discussed at great length. Strict return precautions discussed and pt &/or family have verbalized understanding of the instructions. No further questions at time of discharge.    New Prescriptions   CEPHALEXIN (KEFLEX) 500 MG CAPSULE    Take 1 capsule (500 mg total) by mouth 4 (four) times daily for 7 days.    Follow Up: Verlon Au, MD [address] [place] [address] [phone]     [facility] [address]     [address] [place] [address] [phone]    [facility] EMERGENCY DEPARTMENT [address] [id number] mc [place] [address] [phone]       Mirza Kidney, Canary Brim, MD 10/02/20 [phone]

## 2020-10-03 LAB — URINE CULTURE

## 2020-10-20 ENCOUNTER — Other Ambulatory Visit: Payer: Self-pay | Admitting: Student

## 2020-11-01 ENCOUNTER — Other Ambulatory Visit (HOSPITAL_COMMUNITY): Payer: Self-pay

## 2020-11-01 DIAGNOSIS — R1310 Dysphagia, unspecified: Secondary | ICD-10-CM

## 2020-11-05 ENCOUNTER — Other Ambulatory Visit: Payer: Self-pay

## 2020-11-05 ENCOUNTER — Ambulatory Visit (HOSPITAL_COMMUNITY)
Admission: RE | Admit: 2020-11-05 | Discharge: 2020-11-05 | Disposition: A | Discharge status: Home / Self Care | Payer: BC Managed Care – PPO | Source: Ambulatory Visit | Attending: Gastroenterology | Admitting: Gastroenterology

## 2020-11-05 DIAGNOSIS — R1310 Dysphagia, unspecified: Secondary | ICD-10-CM | POA: Diagnosis not present

## 2021-01-24 ENCOUNTER — Ambulatory Visit (INDEPENDENT_AMBULATORY_CARE_PROVIDER_SITE_OTHER): Payer: BC Managed Care – PPO

## 2021-01-24 ENCOUNTER — Ambulatory Visit
Admission: EM | Admit: 2021-01-24 | Discharge: 2021-01-24 | Disposition: A | Discharge status: Home / Self Care | Payer: BC Managed Care – PPO | Attending: Internal Medicine | Admitting: Internal Medicine

## 2021-01-24 ENCOUNTER — Other Ambulatory Visit: Payer: Self-pay

## 2021-01-24 DIAGNOSIS — M79672 Pain in left foot: Secondary | ICD-10-CM | POA: Diagnosis not present

## 2021-01-24 NOTE — ED Provider Notes (Signed)
[facility]    CSN: [id number] Arrival date & time: 01/24/21  1911      History   Chief Complaint Chief Complaint  Patient presents with   left foot pain    HPI Julie Bennett is a 45 y.o. female.   Patient presents with left foot pain and left fourth digit of foot pain that started yesterday with mild swelling.  Also having some numbness and tingling that is intermittent.  Denies any recent or past injury.  Denies any history of arthritis.  Does have a history of plantar fasciitis with associated surgical correction.  Patient can bear weight with pain.    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      Home Medications    Prior to Admission medications   Medication Sig Start Date End Date Taking? Authorizing Provider  escitalopram (LEXAPRO) 10 MG tablet Take 10 mg by mouth daily.    [provider]  estradiol (ESTRACE) 2 MG tablet Take 2 mg by mouth daily. 07/28/20   [provider]  famotidine (PEPCID) 10 MG tablet Take 1 tablet (10 mg total) by mouth at bedtime. 09/29/20   Evlyn Kanner, MD  medroxyPROGESTERone (PROVERA) 5 MG tablet Take 5 mg by mouth daily. 03/11/19   [provider]  nortriptyline (PAMELOR) 10 MG capsule Take 20 mg by mouth at bedtime. 08/13/20   [provider]  pantoprazole (PROTONIX) 40 MG tablet Take 1 tablet (40 mg total) by mouth 2 (two) times daily. 09/28/20   Evlyn Kanner, MD  Vitamin  D, Ergocalciferol, (DRISDOL) 1.25 MG (50000 UNIT) CAPS capsule Take 1 capsule by mouth once a week. 07/12/20   [provider]    Family History Family History  Problem Relation Age of Onset   Ovarian cancer Mother    Throat cancer Father     Social History Social History   Tobacco Use   Smoking status: Never   Smokeless tobacco: Never  Substance Use Topics   Alcohol use: No   Drug use: No     Allergies   Patient has no known allergies.   Review of Systems Review of Systems Per HPI  Physical Exam Triage Vital Signs ED Triage Vitals  Enc Vitals Group     BP 01/24/21 1952 126/73     Pulse Rate 01/24/21 1952 76     Resp 01/24/21 1952 18     Temp 01/24/21 1952 98.5 F (36.9 C)     Temp Source 01/24/21 1952 Oral     SpO2 01/24/21 1952 98 %     Weight --      Height --      Head Circumference --      Peak Flow --      Pain Score 01/24/21 1953 6     Pain Loc --      Pain Edu? --  Excl. in GC? --    No data found.  Updated Vital Signs BP 126/73 (BP Location: Left Arm)   Pulse 76   Temp 98.5 F (36.9 C) (Oral)   Resp 18   LMP 08/14/2013 Comment: signed preg waiver 6.26.2020  SpO2 98%   Visual Acuity Right Eye Distance:   Left Eye Distance:   Bilateral Distance:    Right Eye Near:   Left Eye Near:    Bilateral Near:     Physical Exam Constitutional:      Appearance: Normal appearance.  HENT:     Head: Normocephalic and atraumatic.  Eyes:     Extraocular Movements: Extraocular movements intact.     Conjunctiva/sclera: Conjunctivae normal.  Pulmonary:     Effort: Pulmonary effort is normal.  Musculoskeletal:     Right ankle: Swelling present.     Left ankle: Normal.     Right foot: Normal.     Left foot: Normal capillary refill. Swelling, tenderness and bony tenderness present. No deformity. Normal pulse.     Comments: Tenderness to palpation to left fourth digit of foot as well as dorsal surface of fourth and fifth metatarsals.   Has swelling to ball of foot as well.  Swelling noted to lateral surface of left ankle.  No tenderness to palpation.  Neurological:     General: No focal deficit present.     Mental Status: She is alert and oriented to person, place, and time. Mental status is at baseline.  Psychiatric:        Mood and Affect: Mood normal.        Behavior: Behavior normal.        Thought Content: Thought content normal.        Judgment: Judgment normal.     UC Treatments / Results  Labs (all labs ordered are listed, but only abnormal results are displayed) Labs Reviewed - No data to display  EKG   Radiology DG Foot 2 Views Left  Result Date: 01/24/2021 CLINICAL DATA:  Trauma EXAM: LEFT FOOT - 2 VIEW COMPARISON:  None. FINDINGS: No fracture or dislocation is seen. The joint spaces are preserved. Visualized soft tissues are within normal limits. IMPRESSION: Negative. Electronically Signed   By: Charline Bills M.D.   On: 01/24/2021 20:29    Procedures Procedures (including critical care time)  Medications Ordered in UC Medications - No data to display  Initial Impression / Assessment and Plan / UC Course  I have reviewed the triage vital signs and the nursing notes.  Pertinent labs & imaging results that were available during my care of the patient were reviewed by me and considered in my medical decision making (see chart for details).     Left foot x-ray was negative for any acute bony abnormality.  Advised patient to treat with NSAIDs as well as ice application.  Patient to follow-up with podiatry if symptoms and pain persist.  Differential diagnosis includes arthritis and muscle strain.  Low suspicion for plantar fasciitis although patient does have pain at the ball of the foot.  Neurovascular intact. no red flags at this time.Discussed strict return precautions. Patient verbalized understanding and is agreeable with plan.  Final Clinical Impressions(s) / UC Diagnoses   Final diagnoses:   Left foot pain     Discharge Instructions      Please take ibuprofen as needed for pain and inflammation. Also use ice application to foot. X-rays were negative. Follow up with primary care or podiatry for  further evaluation and management.      ED Prescriptions   None    PDMP not reviewed this encounter.   Lance Muss, FNP 01/24/21 2037

## 2021-01-24 NOTE — Discharge Instructions (Addendum)
Please take ibuprofen as needed for pain and inflammation. Also use ice application to foot. X-rays were negative. Follow up with primary care or podiatry for further evaluation and management.

## 2021-01-24 NOTE — ED Triage Notes (Signed)
Pt c/o sharp pain to 4th digit left foot with associated edema. States does not know of any trauma or physical mechanism that would have caused this. Onset yesterday.

## 2021-04-14 ENCOUNTER — Emergency Department (HOSPITAL_COMMUNITY)
Admission: EM | Admit: 2021-04-14 | Discharge: 2021-04-14 | Disposition: A | Discharge status: Home / Self Care | Payer: BC Managed Care – PPO | Attending: Emergency Medicine | Admitting: Emergency Medicine

## 2021-04-14 ENCOUNTER — Other Ambulatory Visit: Payer: Self-pay

## 2021-04-14 ENCOUNTER — Emergency Department (HOSPITAL_COMMUNITY): Payer: BC Managed Care – PPO

## 2021-04-14 DIAGNOSIS — Z79899 Other long term (current) drug therapy: Secondary | ICD-10-CM | POA: Diagnosis not present

## 2021-04-14 DIAGNOSIS — M62838 Other muscle spasm: Secondary | ICD-10-CM

## 2021-04-14 DIAGNOSIS — R29810 Facial weakness: Secondary | ICD-10-CM | POA: Insufficient documentation

## 2021-04-14 DIAGNOSIS — T50905A Adverse effect of unspecified drugs, medicaments and biological substances, initial encounter: Secondary | ICD-10-CM

## 2021-04-14 DIAGNOSIS — Y900 Blood alcohol level of less than 20 mg/100 ml: Secondary | ICD-10-CM | POA: Insufficient documentation

## 2021-04-14 DIAGNOSIS — F419 Anxiety disorder, unspecified: Secondary | ICD-10-CM | POA: Diagnosis not present

## 2021-04-14 DIAGNOSIS — M542 Cervicalgia: Secondary | ICD-10-CM | POA: Insufficient documentation

## 2021-04-14 DIAGNOSIS — N9489 Other specified conditions associated with female genital organs and menstrual cycle: Secondary | ICD-10-CM | POA: Diagnosis not present

## 2021-04-14 DIAGNOSIS — Z20822 Contact with and (suspected) exposure to covid-19: Secondary | ICD-10-CM | POA: Insufficient documentation

## 2021-04-14 DIAGNOSIS — R4781 Slurred speech: Secondary | ICD-10-CM | POA: Insufficient documentation

## 2021-04-14 LAB — I-STAT CHEM 8, ED
BUN: 10 mg/dL (ref 6–20)
Calcium, Ion: 1.11 mmol/L — ABNORMAL LOW (ref 1.15–1.40)
Chloride: 105 mmol/L (ref 98–111)
Creatinine, Ser: 1.1 mg/dL — ABNORMAL HIGH (ref 0.44–1.00)
Glucose, Bld: 121 mg/dL — ABNORMAL HIGH (ref 70–99)
HCT: 39 % (ref 36.0–46.0)
Hemoglobin: 13.3 g/dL (ref 12.0–15.0)
Potassium: 3.1 mmol/L — ABNORMAL LOW (ref 3.5–5.1)
Sodium: 141 mmol/L (ref 135–145)
TCO2: 21 mmol/L — ABNORMAL LOW (ref 22–32)

## 2021-04-14 LAB — CBC
HCT: 39.2 % (ref 36.0–46.0)
Hemoglobin: 13 g/dL (ref 12.0–15.0)
MCH: 30.4 pg (ref 26.0–34.0)
MCHC: 33.2 g/dL (ref 30.0–36.0)
MCV: 91.8 fL (ref 80.0–100.0)
Platelets: 291 10*3/uL (ref 150–400)
RBC: 4.27 MIL/uL (ref 3.87–5.11)
RDW: 12.8 % (ref 11.5–15.5)
WBC: 12.3 10*3/uL — ABNORMAL HIGH (ref 4.0–10.5)
nRBC: 0 % (ref 0.0–0.2)

## 2021-04-14 LAB — RESP PANEL BY RT-PCR (FLU A&B, COVID) ARPGX2
Influenza A by PCR: NEGATIVE
Influenza B by PCR: NEGATIVE
SARS Coronavirus 2 by RT PCR: NEGATIVE

## 2021-04-14 LAB — COMPREHENSIVE METABOLIC PANEL
ALT: 21 U/L (ref 0–44)
AST: 35 U/L (ref 15–41)
Albumin: 3.8 g/dL (ref 3.5–5.0)
Alkaline Phosphatase: 99 U/L (ref 38–126)
Anion gap: 12 (ref 5–15)
BUN: 12 mg/dL (ref 6–20)
CO2: 21 mmol/L — ABNORMAL LOW (ref 22–32)
Calcium: 8.9 mg/dL (ref 8.9–10.3)
Chloride: 106 mmol/L (ref 98–111)
Creatinine, Ser: 1.08 mg/dL — ABNORMAL HIGH (ref 0.44–1.00)
GFR, Estimated: >60 mL/min (ref >60)
Glucose, Bld: 127 mg/dL — ABNORMAL HIGH (ref 70–99)
Potassium: 3 mmol/L — ABNORMAL LOW (ref 3.5–5.1)
Sodium: 139 mmol/L (ref 135–145)
Total Bilirubin: 0.4 mg/dL (ref 0.3–1.2)
Total Protein: 7.1 g/dL (ref 6.5–8.1)

## 2021-04-14 LAB — I-STAT BETA HCG BLOOD, ED (MC, WL, AP ONLY): I-stat hCG, quantitative: <5 m[IU]/mL (ref <5)

## 2021-04-14 LAB — DIFFERENTIAL
Abs Immature Granulocytes: 0.04 10*3/uL (ref 0.00–0.07)
Basophils Absolute: 0.1 10*3/uL (ref 0.0–0.1)
Basophils Relative: 1 %
Eosinophils Absolute: 0.1 10*3/uL (ref 0.0–0.5)
Eosinophils Relative: 1 %
Immature Granulocytes: 0 %
Lymphocytes Relative: 19 %
Lymphs Abs: 2.3 10*3/uL (ref 0.7–4.0)
Monocytes Absolute: 0.7 10*3/uL (ref 0.1–1.0)
Monocytes Relative: 6 %
Neutro Abs: 9.1 10*3/uL — ABNORMAL HIGH (ref 1.7–7.7)
Neutrophils Relative %: 73 %

## 2021-04-14 LAB — APTT: aPTT: 25 seconds (ref 24–36)

## 2021-04-14 LAB — PROTIME-INR
INR: 1 (ref 0.8–1.2)
Prothrombin Time: 12.8 seconds (ref 11.4–15.2)

## 2021-04-14 LAB — CK: Total CK: 374 U/L — ABNORMAL HIGH (ref 38–234)

## 2021-04-14 LAB — ETHANOL: Alcohol, Ethyl (B): <10 mg/dL (ref <10)

## 2021-04-14 LAB — CBG MONITORING, ED: Glucose-Capillary: 117 mg/dL — ABNORMAL HIGH (ref 70–99)

## 2021-04-14 IMAGING — CT CT ANGIO NECK
2 of 7 series · 8 of 33 positions shown · IV contrast (omnipaque)
Comparison: None.

CLINICAL DATA: Dizziness and abnormal movement



[Series 7: cta head neck · axial · 0.47mm/px · z∈[-231,-115]mm · 2 of 176 slices shown]
[im 59/176  soft-tissue]
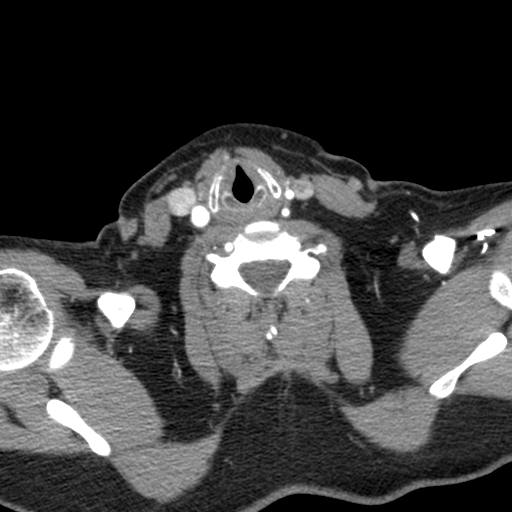
[im 117/176  soft-tissue]
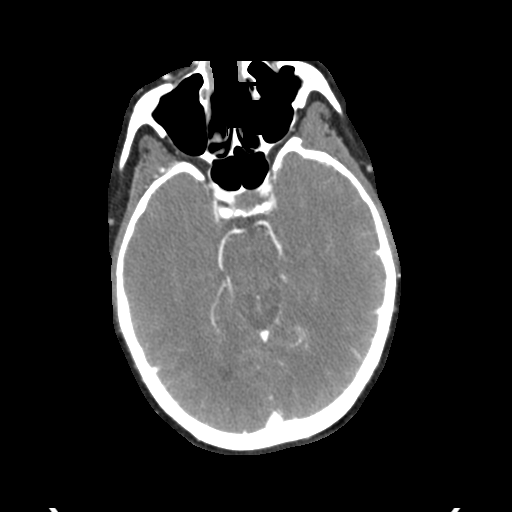

[Series 9: ax thin · axial · 0.39mm/px · z∈[-297,-48]mm · 6 of 349 slices shown]
[im 50/349  soft-tissue]
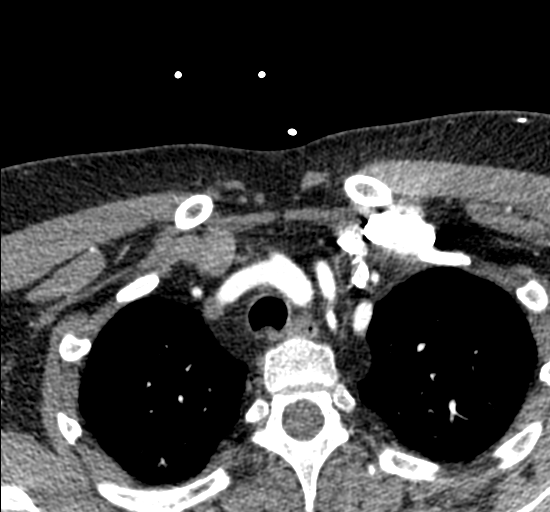
[im 100/349  bone]
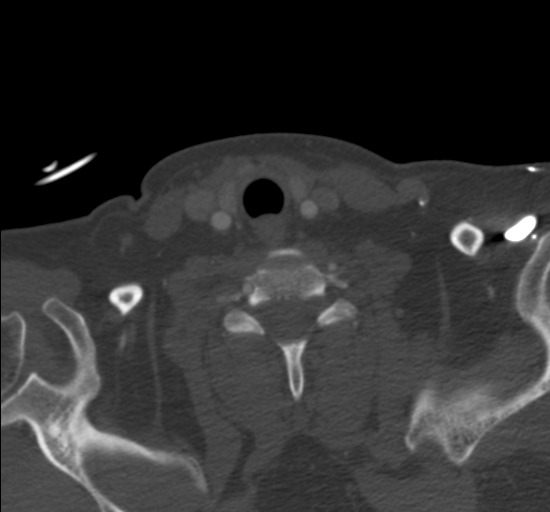
[im 150/349  soft-tissue]
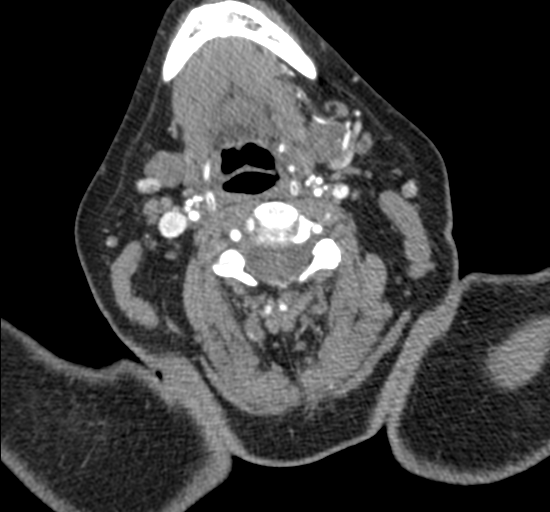
[im 199/349  bone]
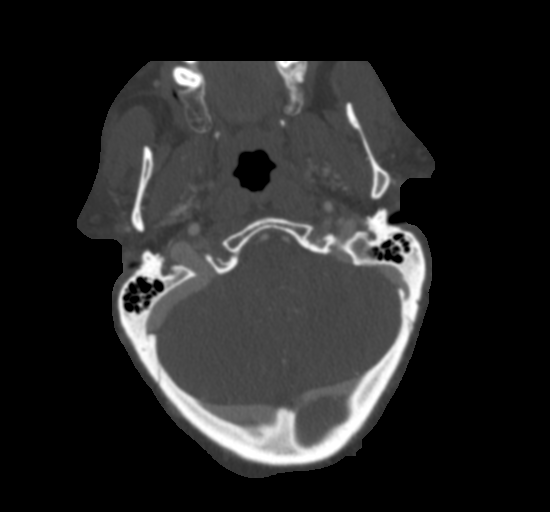
[im 249/349  soft-tissue]
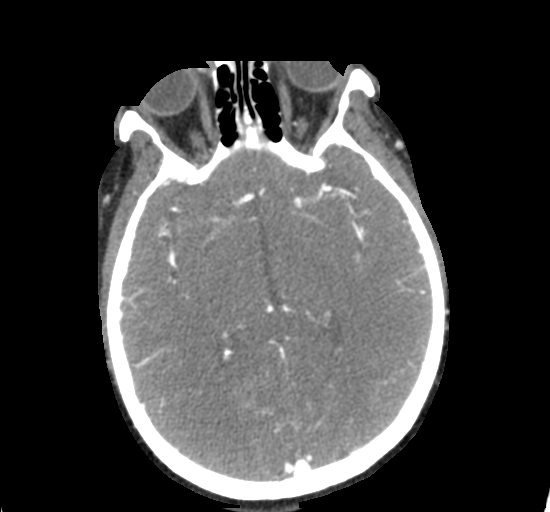
[im 299/349  bone]
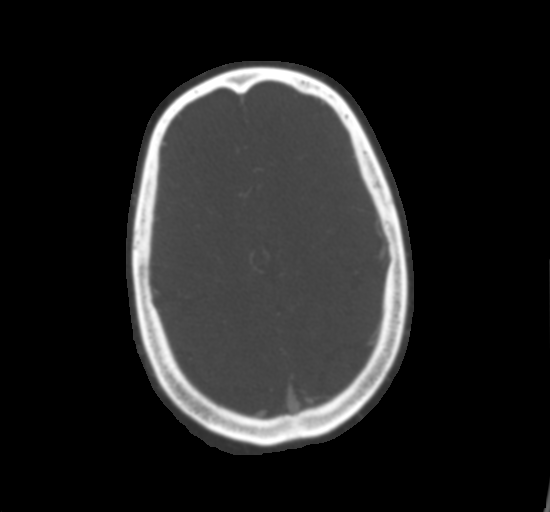

[8 of 33 positions shown; findings below may reference images not displayed]

FINDINGS: CTA NECK FINDINGS

SKELETON: There is no bony spinal canal stenosis. No lytic or
blastic lesion.

OTHER NECK: Normal pharynx, larynx and major salivary glands. No
cervical lymphadenopathy. Unremarkable thyroid gland.

UPPER CHEST: No pneumothorax or pleural effusion. No nodules or
masses.

AORTIC ARCH:

There is no calcific atherosclerosis of the aortic arch. There is no
aneurysm, dissection or hemodynamically significant stenosis of the
visualized portion of the aorta. Conventional 3 vessel aortic
branching pattern. The visualized proximal subclavian arteries are
widely patent.

RIGHT CAROTID SYSTEM: Normal without aneurysm, dissection or
stenosis.

LEFT CAROTID SYSTEM: Normal without aneurysm, dissection or
stenosis.

VERTEBRAL ARTERIES: Left dominant configuration. Both origins are
clearly patent. There is no dissection, occlusion or flow-limiting
stenosis to the skull base (V1-V3 segments).

CTA HEAD FINDINGS

POSTERIOR CIRCULATION:

--Vertebral arteries: Normal V4 segments.

--Inferior cerebellar arteries: Normal.

--Basilar artery: Normal.

--Superior cerebellar arteries: Normal.

--Posterior cerebral arteries (PCA): Normal.

ANTERIOR CIRCULATION:

--Intracranial internal carotid arteries: Normal.

--Anterior cerebral arteries (ACA): Normal. Both A1 segments are
present. Patent anterior communicating artery (a-comm).

--Middle cerebral arteries (MCA): Normal.

VENOUS SINUSES: As permitted by contrast timing, patent.

ANATOMIC VARIANTS: None

Review of the MIP images confirms the above findings.
IMPRESSION: Normal CTA of the head and neck.

## 2021-04-14 IMAGING — CT CT HEAD CODE STROKE
3 series · 16 of 47 positions shown, 19 images · non-contrast
Comparison: None.

CLINICAL DATA: Code stroke.  Neuro deficit, acute, stroke suspected

EXAM:
CT HEAD WITHOUT CONTRAST
TECHNIQUE: Contiguous axial images were obtained from the base of the skull
through the vertex without intravenous contrast.

[Series 3: head wo · axial · 0.43mm/px · z∈[+1427,+1572]mm · 10 of 35 slices shown, 13 images]
[im 3/35  brain]
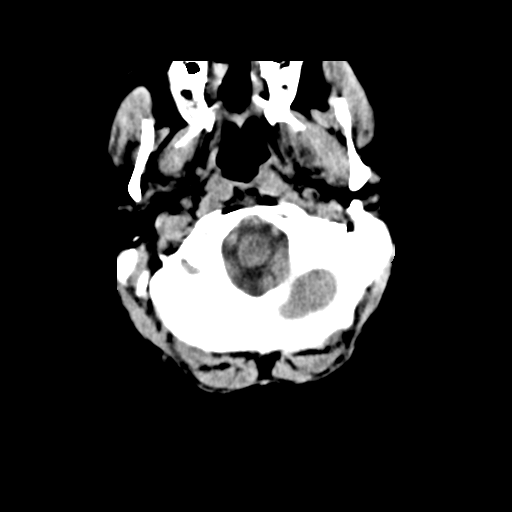
[im 3/35  bone]
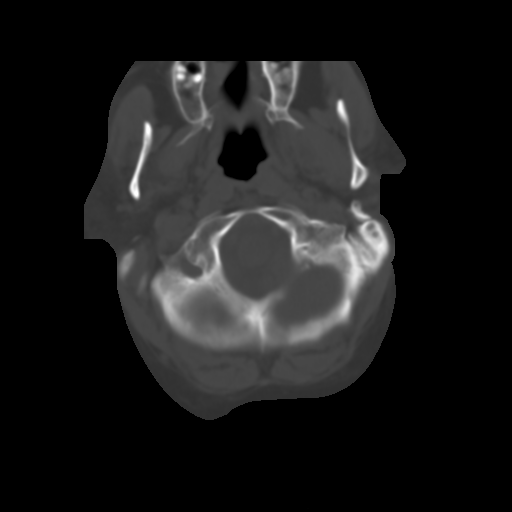
[im 6/35  brain]
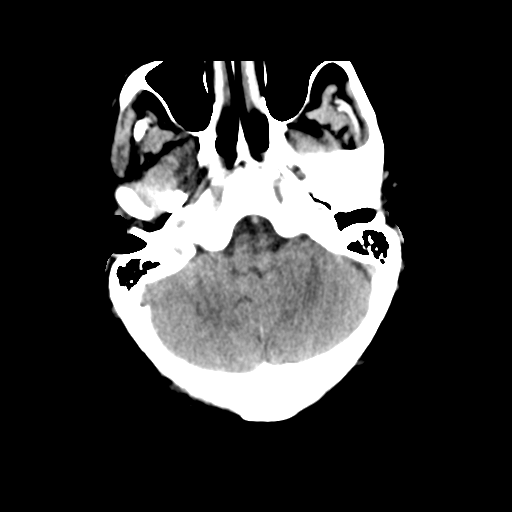
[im 10/35  brain]
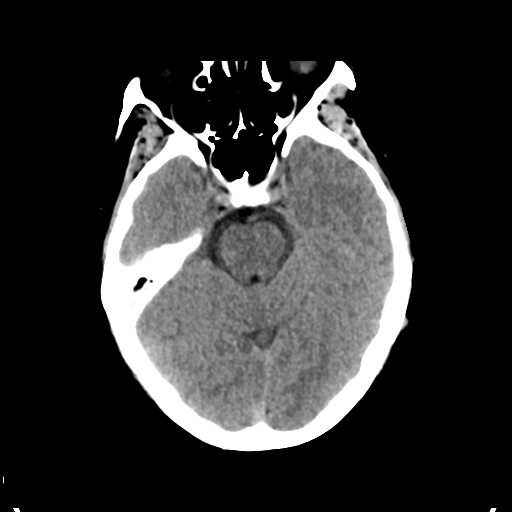
[im 12/35  brain]
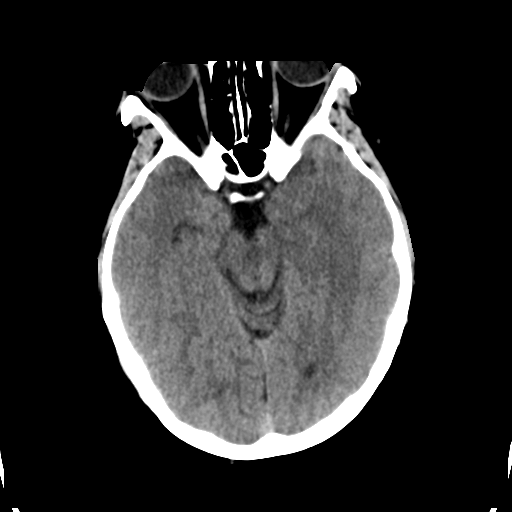
[im 16/35  brain]
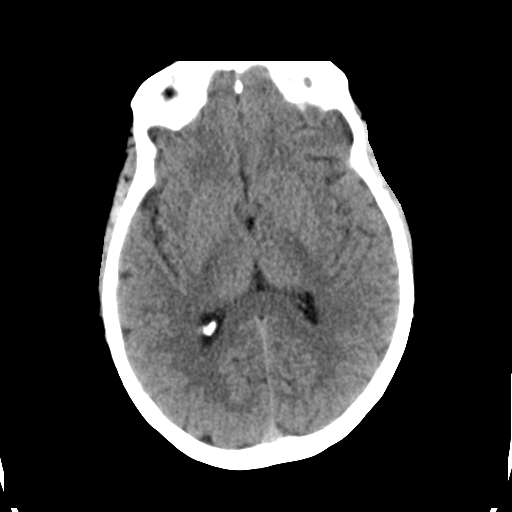
[im 16/35  bone]
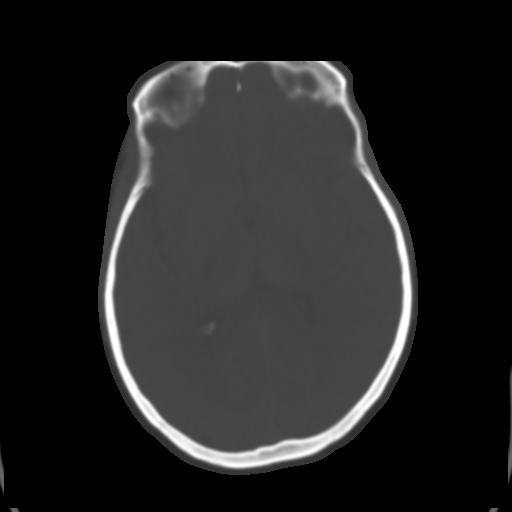
[im 19/35  brain]
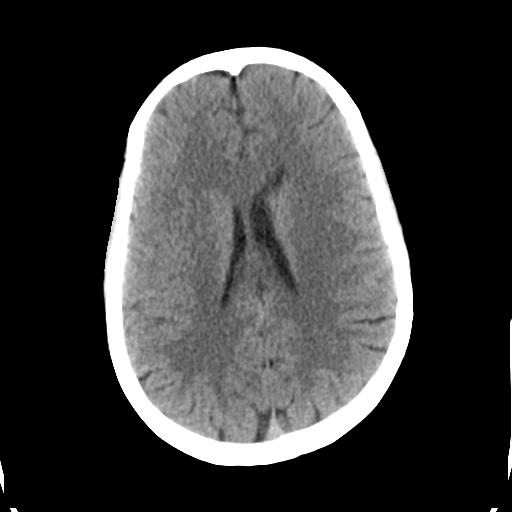
[im 23/35  brain]
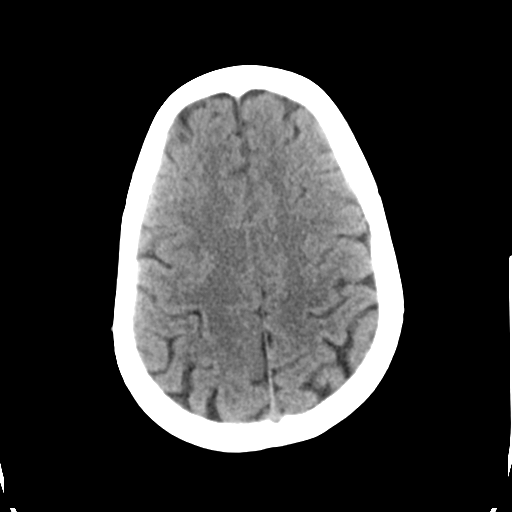
[im 26/35  brain]
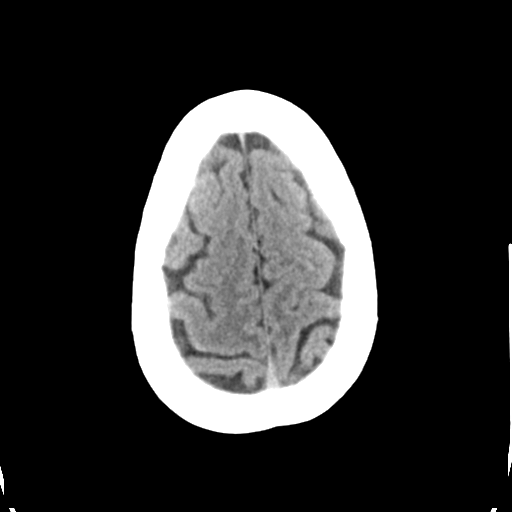
[im 29/35  brain]
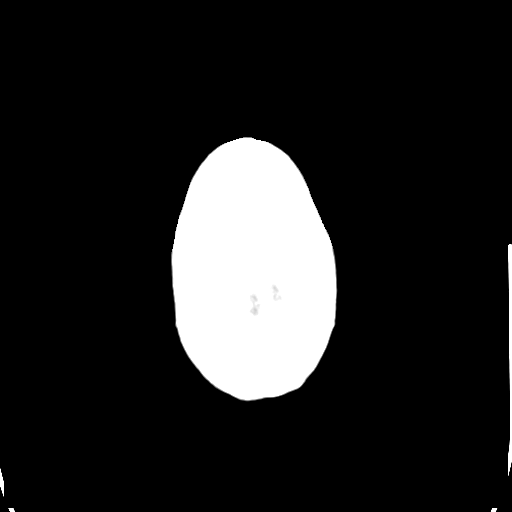
[im 29/35  bone]
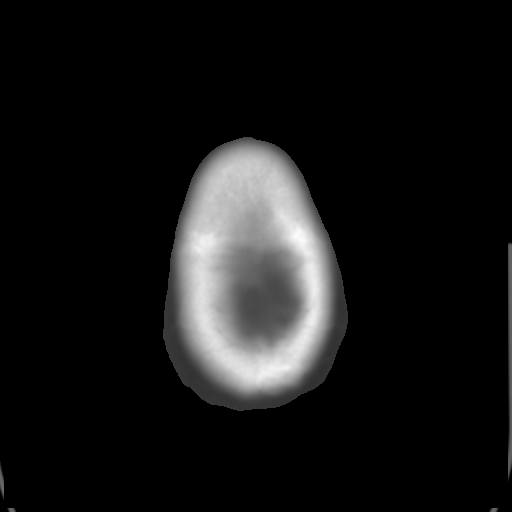
[im 32/35  brain]
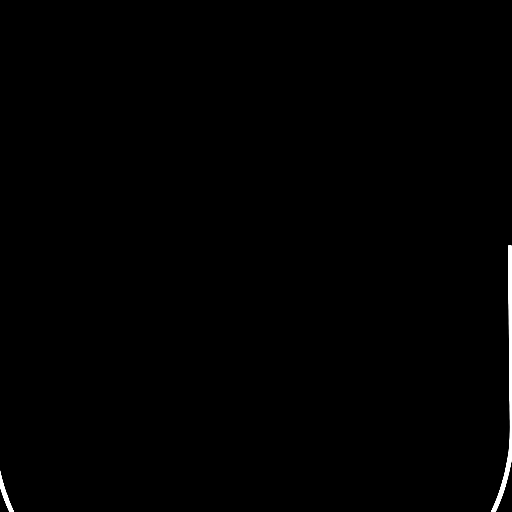

[Series 5: coronal soft tissue · coronal · 0.34mm/px · 3 of 66 slices shown]
[im 22/66  brain]
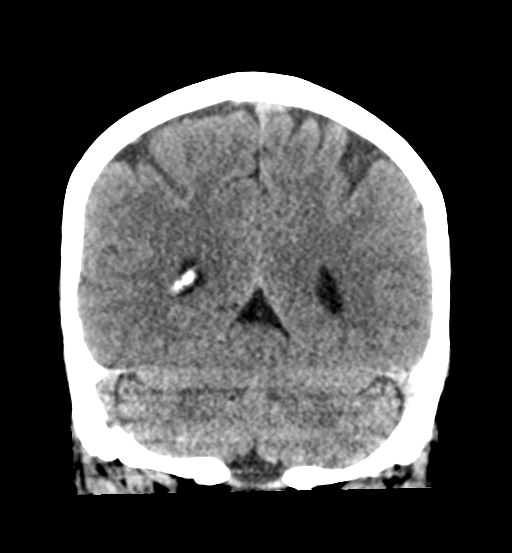
[im 29/66  brain]
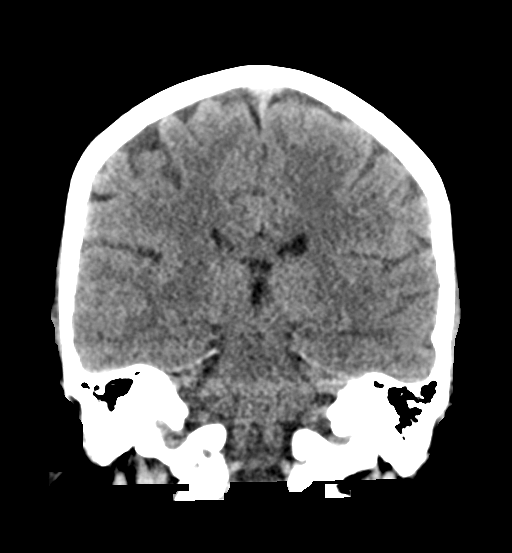
[im 37/66  brain]
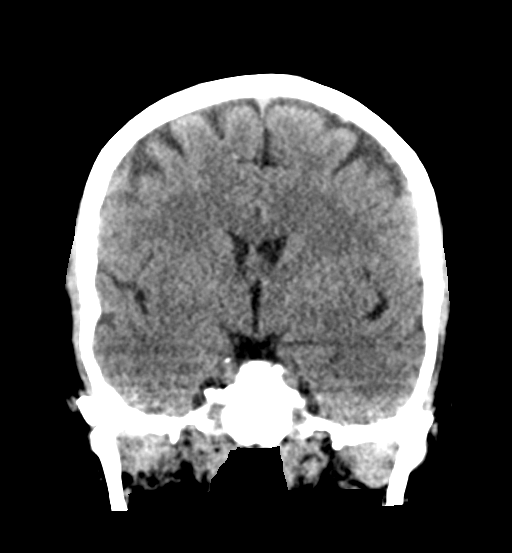

[Series 6: sagittal soft tissue · sagittal · 0.37mm/px · 3 of 57 slices shown]
[im 19/57  brain]
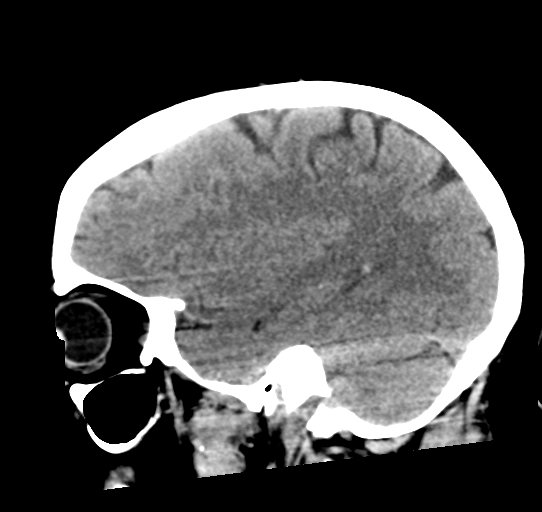
[im 29/57  brain]
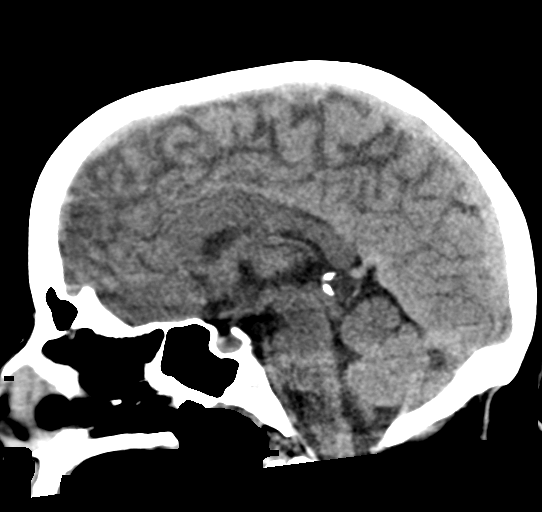
[im 38/57  brain]
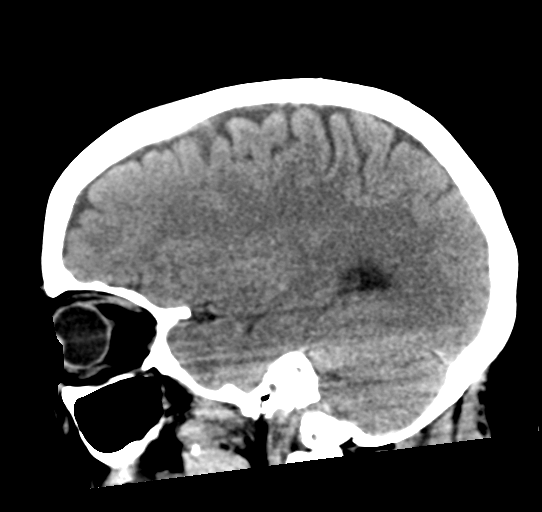

[16 of 47 positions shown; findings below may reference images not displayed]

FINDINGS: Brain: There is no mass, hemorrhage or extra-axial collection. The
size and configuration of the ventricles and extra-axial CSF spaces
are normal. The brain parenchyma is normal, without evidence of
acute or chronic infarction.

Vascular: No abnormal hyperdensity of the major intracranial
arteries or dural venous sinuses. No intracranial atherosclerosis.

Skull: The visualized skull base, calvarium and extracranial soft
tissues are normal.

Sinuses/Orbits: No fluid levels or advanced mucosal thickening of
the visualized paranasal sinuses. No mastoid or middle ear effusion.
The orbits are normal.

ASPECTS (Alberta Stroke Program Early CT Score)

- Ganglionic level infarction (caudate, lentiform nuclei, internal
capsule, insula, M1-M3 cortex): 7

- Supraganglionic infarction (M4-M6 cortex): 3

Total score (0-10 with 10 being normal): 10
IMPRESSION: 1. Normal head CT.
2. ASPECTS is 10.

These results were called by telephone at the time of interpretation
on [DATE] at [DATE] to provider LODOVICO , who verbally
acknowledged these results.

## 2021-04-14 MED ORDER — DIPHENHYDRAMINE HCL 50 MG/ML IJ SOLN: 25.0000 mg | Freq: Once | INTRAMUSCULAR | Status: DC

## 2021-04-14 MED ORDER — SODIUM CHLORIDE 0.9 % IV BOLUS
1000.0000 mL | Freq: Once | INTRAVENOUS | Status: AC
  Administered 2021-04-14: 1000 mL via INTRAVENOUS

## 2021-04-14 MED ORDER — LORAZEPAM 1 MG PO TABS: 1.0000 mg | ORAL_TABLET | Freq: Three times a day (TID) | ORAL | 0 refills | Status: DC | PRN

## 2021-04-14 MED ORDER — KETOROLAC TROMETHAMINE 30 MG/ML IJ SOLN
30.0000 mg | Freq: Once | INTRAMUSCULAR | Status: AC
  Administered 2021-04-14: 30 mg via INTRAVENOUS
  Filled 2021-04-14: qty 1

## 2021-04-14 MED ORDER — POTASSIUM CHLORIDE CRYS ER 20 MEQ PO TBCR
40.0000 meq | EXTENDED_RELEASE_TABLET | Freq: Once | ORAL | Status: AC
  Administered 2021-04-14: 40 meq via ORAL
  Filled 2021-04-14: qty 2

## 2021-04-14 MED ORDER — IOHEXOL 350 MG/ML SOLN
80.0000 mL | Freq: Once | INTRAVENOUS | Status: AC | PRN
  Administered 2021-04-14: 80 mL via INTRAVENOUS

## 2021-04-14 MED ORDER — LORAZEPAM 2 MG/ML IJ SOLN
1.0000 mg | Freq: Once | INTRAMUSCULAR | Status: AC
  Administered 2021-04-14: 1 mg via INTRAVENOUS
  Filled 2021-04-14: qty 1

## 2021-04-14 NOTE — ED Notes (Signed)
Husband is at bedside.

## 2021-04-14 NOTE — ED Provider Notes (Signed)
Emergency Medicine Provider Triage Evaluation Note  Julie Bennett , a 45 y.o. female  was evaluated in triage.  She was reportedly seen at a outside hospital yesterday after eating an edible gummy.  She was diagnosed with drug-induced tardive dystonia, treated with Ativan and Benadryl and sent home with Benadryl.  Husband gave 15 mL of liquid Benadryl prior to arrival today.  Due to concern of allergic reaction she was reportedly given IM epi by fire prior to EMS arrival. She does have a history of laryngospasm. Patient is unable to provide history. She does have an IV. Patient is taken to recess B.   Julie Bennett 04/14/21 Julie Bennett    Gerhard Munch, MD 04/14/21 2329

## 2021-04-14 NOTE — ED Notes (Signed)
Walked into room, patient began contracting her arms and face.

## 2021-04-14 NOTE — Discharge Instructions (Signed)
You likely have side effects from the CBD Gummies.  The side effects can last for several days.  Stay hydrated and take Tylenol Motrin for pain  You can take Ativan as needed for muscle spasms.  You can also take Benadryl as needed as well  See your doctor for follow-up. Consider following up with a neurologist if you have persistent neck spasms or trouble speaking  Return to ER if you have uncontrolled neck spasm, trouble speaking, weakness, numbness

## 2021-04-14 NOTE — ED Notes (Signed)
Patients husband at bedside. He reports that at 6:00pm, he last saw her normal.

## 2021-04-14 NOTE — ED Triage Notes (Signed)
Pt arrives via ems from home. EMS states the fire dept that initially arrived on scene administered im epi at 1800 for the complaint of an allergic reaction. Pt does have hx of anxiety. Pt was seen at an OSH yesterday after eating an edible gummy. She was diagnosed with drug induced tardive dystonia. Pt presents with her head turned to the right and states she can't move it.

## 2021-04-14 NOTE — ED Notes (Signed)
Back from Ct.

## 2021-04-14 NOTE — Consult Note (Signed)
[facility] Consult Services   Patient Name:   Julie Bennett, Julie Bennett Date of Birth:   Jan 13, 1976 Identification Number:   MRN - [id number] Date of Service:   04/14/2021 20:10:47  Diagnosis:       G93.49 - Encephalopathy Multifactorial  Impression:      45 year old woman presenting today with neck turned to the R side, this has occurred intermittently since yesterday. No thrombolytics, CTA head/neck no abnormality. Poor effort with her exam, CMP, CBC, CK level, NS fluids-all within normal limits. Not likely seizures, she responds to pain and is alert during this time. No LVO. Recommend hydration, no edibles, and outpatient neurology/PCP follow up.  Metrics: Last Known Well: Unknown [facility] Notification Time: 04/14/2021 20:10:47 Arrival Time: 04/14/2021 18:50:00 Stamp Time: 04/14/2021 20:10:47 Initial Response Time: 04/14/2021 20:12:10 Symptoms: Head movement. NIHSS Start Assessment Time: 04/14/2021 20:21:04 Patient is not a candidate for Thrombolytic. Thrombolytic Medical Decision: 04/14/2021 20:24:20 Patient was not deemed candidate for Thrombolytic because of following reasons: Last Well Known Above 4.5 Hours. Other Diagnosis suspected.  CT head showed no acute hemorrhage or acute core infarct.  ED Physician notified of diagnostic impression and management plan on 04/14/2021 22:01:37  Advanced Imaging: CTA Head and Neck Completed.  LVO:No  Patient doesn't meet criteria for emergent NIR consideration     ------------------------------------------------------------------------------  History of Present Illness: Patient is a 45 year old Female.  Patient was brought by EMS for symptoms of Head movement.  45 year old woman with tremors and could not move her head from the right side after having edible gummies. She had benadryl for possible dystonia. EMS gave her IM Epi today for possible allergic reaction another episode of tremor and  her right arm pulled up and inwards, headache. 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 She postures while we are examining her and she does not talk, grimacing and withdraws on painful stimuli. No thrombolytics, given other diagnosis suspected.   Social History: Smoking: No Alcohol Use: No Drug Use: Yes  Family History:      brother possibly with neurological diseases, sister unable to review  No Anticoagulant use   No Antiplatelet use  Allergies:  Reviewed    Examination: BP(133/77), Pulse(88), Blood Glucose(170) 1A: Level of Consciousness - Alert; keenly responsive + 0 1B: Ask Month and Age - Both Questions Right + 0 1C: Blink Eyes & Squeeze Hands - Performs Both Tasks + 0 2: Test Horizontal Extraocular Movements - Normal + 0 3: Test Visual Fields - No Visual Loss + 0 4: Test Facial Palsy (Use Grimace if Obtunded) - Normal symmetry + 0 5A: Test Left Arm Motor Drift - No Drift for 10 Seconds + 0 5B: Test Right Arm Motor Drift - No Drift for 10 Seconds + 0 6A: Test Left Leg Motor Drift - No Drift for 5 Seconds + 0 6B: Test Right Leg Motor Drift - No Drift for 5 Seconds + 0 7: Test Limb Ataxia (FNF/Heel-Shin) - No Ataxia + 0 8: Test Sensation - Normal; No sensory loss + 0 9: Test Language/Aphasia - Normal; No aphasia + 0 10: Test Dysarthria - Normal + 0 11: Test Extinction/Inattention - No abnormality + 0  NIHSS Score: 0   Pre-Morbid Modified Rankin Scale: 0 Points = No symptoms at all   Patient/Family was informed the Neurology Consult would occur via TeleHealth consult by way of interactive audio and video telecommunications and consented to receiving care in this manner.  Patient is being evaluated for possible acute neurologic impairment and high probability of imminent or life-threatening deterioration. I spent total of 35 minutes providing care to this patient, including time for face to face visit via telemedicine, review of medical records,  imaging studies and discussion of findings with providers, the patient and/or family.   Dr Stefani Dama   [facility] [phone]  Case [id number]

## 2021-04-14 NOTE — ED Notes (Signed)
Speaking with neurolgy

## 2021-04-14 NOTE — ED Notes (Signed)
In CT

## 2021-04-14 NOTE — ED Notes (Signed)
Patient is able to respond to question via touch, however unable to verbalize due to stiffness and tightness using her mouth. All words are incomprehensible. Patient was able to ambulate from the wheelchair to the bed unassisted when arriving to the room. Patient responds to painful stimuli during and after periods or muscle contraction and stiffness. Patient experiences stiffness in her mouth, bilateral arms and legs.

## 2021-04-14 NOTE — ED Provider Notes (Signed)
[facility]-EMERGENCY DEPT Provider Note   CSN: [id number] Arrival date & time: 04/14/21  1850  An emergency department physician performed an initial assessment on this suspected stroke patient at 16.  History Chief Complaint  Patient presents with   Anxiety   Allergic Reaction    Julie Bennett is a 45 y.o. female here presenting with muscle stiffness, and some slurred speech.  Patient went to her son's graduation yesterday at [place].  She apparently had CBD Gummies at that time and then shortly afterwards had an episode where she tensed up and became very rigid.  Patient was seen at the ER there was told that she has dystonic reaction.  She was given Ativan and symptoms resolved.  Patient's husband drove her back to town.  She was watching TV today and around 6 PM, had another episode where she tensed up again.  She was noted to have left facial droop and had trouble speaking.  Patient was given Benadryl prior to arrival.  EMS gave her epinephrine due to concern for possible anaphylaxis.  However she has no rash.  She adamantly denies taking more CBD Gummies or any other drugs  The history is provided by the patient.      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     Family History  Problem Relation Age of Onset   Ovarian cancer Mother    Throat cancer Father     Social History   Tobacco Use   Smoking status: Never   Smokeless tobacco:  Never  Substance Use Topics   Alcohol use: No   Drug use: No    Home Medications Prior to Admission medications   Medication Sig Start Date End Date Taking? Authorizing Provider  escitalopram (LEXAPRO) 10 MG tablet Take 10 mg by mouth daily.    [provider]  estradiol (ESTRACE) 2 MG tablet Take 2 mg by mouth daily. 07/28/20   [provider]  famotidine (PEPCID) 10 MG tablet Take 1 tablet (10 mg total) by mouth at bedtime. 09/29/20   Evlyn Kanner, MD  medroxyPROGESTERone (PROVERA) 5 MG tablet Take 5 mg by mouth daily. 03/11/19   [provider]  nortriptyline (PAMELOR) 10 MG capsule Take 20 mg by mouth at bedtime. 08/13/20   [provider]  pantoprazole (PROTONIX) 40 MG tablet Take 1 tablet (40 mg total) by mouth 2 (two) times daily. 09/28/20   Evlyn Kanner, MD  Vitamin D, Ergocalciferol, (DRISDOL) 1.25 MG (50000 UNIT) CAPS capsule Take 1 capsule by mouth once a week. 07/12/20   [provider]    Allergies    Patient has no known allergies.  Review of Systems   Review of Systems  Neurological:  Positive for speech difficulty.  All other systems reviewed and are negative.  Physical Exam Updated Vital Signs BP 130/84   Pulse 94  Temp 98 F (36.7 C) (Oral)   Resp (!) 27   Ht 5\' 5"  (1.651 m)   Wt 79.4 kg   LMP 08/14/2013 Comment: signed preg waiver 6.26.2020  SpO2 98%   BMI 29.12 kg/m   Physical Exam Vitals and nursing note reviewed.  Constitutional:      Comments: Uncomfortable and patient appears to be tensed up all over  HENT:     Head: Normocephalic.     Nose: Nose normal.     Mouth/Throat:     Mouth: Mucous membranes are moist.  Eyes:     Extraocular Movements: Extraocular movements intact.     Pupils: Pupils are equal, round, and reactive to light.  Neck:     Comments: Patient's neck is veered towards the right.  Patient's neck is turned towards the right.  Patient appears to have SCM spasm on the left side.   Patient does have an obvious left facial droop unclear if it is from muscle spasm or not Cardiovascular:     Rate and Rhythm: Normal rate and regular rhythm.     Pulses: Normal pulses.     Heart sounds: Normal heart sounds.  Pulmonary:     Effort: Pulmonary effort is normal.     Breath sounds: Normal breath sounds.  Abdominal:     General: Abdomen is flat.     Palpations: Abdomen is soft.  Musculoskeletal:        General: Normal range of motion.  Skin:    General: Skin is warm.     Capillary Refill: Capillary refill takes less than 2 seconds.  Neurological:     Comments: Patient appears to have slurred speech but it is very difficult to evaluate because she has muscle spasms of the left side of her neck.  Patient has obvious left facial droop.  Patient has spasms all over and difficult for her to lift her arms or legs.  Psychiatric:        Mood and Affect: Mood normal.        Behavior: Behavior normal.    ED Results / Procedures / Treatments   Labs (all labs ordered are listed, but only abnormal results are displayed) Labs Reviewed  I-STAT CHEM 8, ED - Abnormal; Notable for the following components:      Result Value   Potassium 3.1 (*)    Creatinine, Ser 1.10 (*)    Glucose, Bld 121 (*)    Calcium, Ion 1.11 (*)    TCO2 21 (*)    All other components within normal limits  CBG MONITORING, ED - Abnormal; Notable for the following components:   Glucose-Capillary 117 (*)    All other components within normal limits  RESP PANEL BY RT-PCR (FLU A&B, COVID) ARPGX2  ETHANOL  PROTIME-INR  APTT  CBC  DIFFERENTIAL  COMPREHENSIVE METABOLIC PANEL  RAPID URINE DRUG SCREEN, HOSP PERFORMED  URINALYSIS, ROUTINE W REFLEX MICROSCOPIC  I-STAT BETA HCG BLOOD, ED ([facility], [facility], [facility] ONLY)    EKG EKG Interpretation  Date/Time:  Sunday April 14 2021 19:16:24 EST Ventricular Rate:  85 PR Interval:  140 QRS Duration: 102 QT Interval:  408 QTC Calculation: 486 R Axis:   2 Text  Interpretation: Sinus rhythm Borderline prolonged QT interval No significant change since last tracing Confirmed by Richardean Canal [phone]) on 04/14/2021 7:56:03 PM  Radiology CT Angio Head W or Wo Contrast  Result Date: 04/14/2021 CLINICAL DATA:  Dizziness and abnormal movement EXAM: CT ANGIOGRAPHY HEAD AND NECK TECHNIQUE:  Multidetector CT imaging of the head and neck was performed using the standard protocol during bolus administration of intravenous contrast. Multiplanar CT image reconstructions and MIPs were obtained to evaluate the vascular anatomy. Carotid stenosis measurements (when applicable) are obtained utilizing NASCET criteria, using the distal internal carotid diameter as the denominator. CONTRAST:  90mL OMNIPAQUE IOHEXOL 350 MG/ML SOLN COMPARISON:  None. FINDINGS: CTA NECK FINDINGS SKELETON: There is no bony spinal canal stenosis. No lytic or blastic lesion. OTHER NECK: Normal pharynx, larynx and major salivary glands. No cervical lymphadenopathy. Unremarkable thyroid gland. UPPER CHEST: No pneumothorax or pleural effusion. No nodules or masses. AORTIC ARCH: There is no calcific atherosclerosis of the aortic arch. There is no aneurysm, dissection or hemodynamically significant stenosis of the visualized portion of the aorta. Conventional 3 vessel aortic branching pattern. The visualized proximal subclavian arteries are widely patent. RIGHT CAROTID SYSTEM: Normal without aneurysm, dissection or stenosis. LEFT CAROTID SYSTEM: Normal without aneurysm, dissection or stenosis. VERTEBRAL ARTERIES: Left dominant configuration. Both origins are clearly patent. There is no dissection, occlusion or flow-limiting stenosis to the skull base (V1-V3 segments). CTA HEAD FINDINGS POSTERIOR CIRCULATION: --Vertebral arteries: Normal V4 segments. --Inferior cerebellar arteries: Normal. --Basilar artery: Normal. --Superior cerebellar arteries: Normal. --Posterior cerebral arteries (PCA): Normal. ANTERIOR CIRCULATION:  --Intracranial internal carotid arteries: Normal. --Anterior cerebral arteries (ACA): Normal. Both A1 segments are present. Patent anterior communicating artery (a-comm). --Middle cerebral arteries (MCA): Normal. VENOUS SINUSES: As permitted by contrast timing, patent. ANATOMIC VARIANTS: None Review of the MIP images confirms the above findings. IMPRESSION: Normal CTA of the head and neck. Electronically Signed   By: Deatra Robinson M.D.   On: 04/14/2021 20:38   CT Angio Neck W and/or Wo Contrast  Result Date: 04/14/2021 CLINICAL DATA:  Dizziness and abnormal movement EXAM: CT ANGIOGRAPHY HEAD AND NECK TECHNIQUE: Multidetector CT imaging of the head and neck was performed using the standard protocol during bolus administration of intravenous contrast. Multiplanar CT image reconstructions and MIPs were obtained to evaluate the vascular anatomy. Carotid stenosis measurements (when applicable) are obtained utilizing NASCET criteria, using the distal internal carotid diameter as the denominator. CONTRAST:  63mL OMNIPAQUE IOHEXOL 350 MG/ML SOLN COMPARISON:  None. FINDINGS: CTA NECK FINDINGS SKELETON: There is no bony spinal canal stenosis. No lytic or blastic lesion. OTHER NECK: Normal pharynx, larynx and major salivary glands. No cervical lymphadenopathy. Unremarkable thyroid gland. UPPER CHEST: No pneumothorax or pleural effusion. No nodules or masses. AORTIC ARCH: There is no calcific atherosclerosis of the aortic arch. There is no aneurysm, dissection or hemodynamically significant stenosis of the visualized portion of the aorta. Conventional 3 vessel aortic branching pattern. The visualized proximal subclavian arteries are widely patent. RIGHT CAROTID SYSTEM: Normal without aneurysm, dissection or stenosis. LEFT CAROTID SYSTEM: Normal without aneurysm, dissection or stenosis. VERTEBRAL ARTERIES: Left dominant configuration. Both origins are clearly patent. There is no dissection, occlusion or flow-limiting  stenosis to the skull base (V1-V3 segments). CTA HEAD FINDINGS POSTERIOR CIRCULATION: --Vertebral arteries: Normal V4 segments. --Inferior cerebellar arteries: Normal. --Basilar artery: Normal. --Superior cerebellar arteries: Normal. --Posterior cerebral arteries (PCA): Normal. ANTERIOR CIRCULATION: --Intracranial internal carotid arteries: Normal. --Anterior cerebral arteries (ACA): Normal. Both A1 segments are present. Patent anterior communicating artery (a-comm). --Middle cerebral arteries (MCA): Normal. VENOUS SINUSES: As permitted by contrast timing, patent. ANATOMIC VARIANTS: None Review of the MIP images confirms the above findings. IMPRESSION: Normal CTA of the head and neck. Electronically Signed   By: Deatra Robinson M.D.   On: 04/14/2021 20:38   CT  HEAD CODE STROKE WO CONTRAST  Result Date: 04/14/2021 CLINICAL DATA:  Code stroke.  Neuro deficit, acute, stroke suspected EXAM: CT HEAD WITHOUT CONTRAST TECHNIQUE: Contiguous axial images were obtained from the base of the skull through the vertex without intravenous contrast. COMPARISON:  None. FINDINGS: Brain: There is no mass, hemorrhage or extra-axial collection. The size and configuration of the ventricles and extra-axial CSF spaces are normal. The brain parenchyma is normal, without evidence of acute or chronic infarction. Vascular: No abnormal hyperdensity of the major intracranial arteries or dural venous sinuses. No intracranial atherosclerosis. Skull: The visualized skull base, calvarium and extracranial soft tissues are normal. Sinuses/Orbits: No fluid levels or advanced mucosal thickening of the visualized paranasal sinuses. No mastoid or middle ear effusion. The orbits are normal. ASPECTS [facility] Stroke Program Early CT Score) - Ganglionic level infarction (caudate, lentiform nuclei, internal capsule, insula, M1-M3 cortex): 7 - Supraganglionic infarction (M4-M6 cortex): 3 Total score (0-10 with 10 being normal): 10 IMPRESSION: 1. Normal head  CT. 2. ASPECTS is 10. These results were called by telephone at the time of interpretation on 04/14/2021 at 8:12 pm to provider Coree Riester , who verbally acknowledged these results. Electronically Signed   By: Deatra Robinson M.D.   On: 04/14/2021 20:13    Procedures Procedures   Medications Ordered in ED Medications  diphenhydrAMINE (BENADRYL) injection 25 mg (0 mg Intravenous Hold 04/14/21 1917)  sodium chloride 0.9 % bolus 1,000 mL (has no administration in time range)  LORazepam (ATIVAN) injection 1 mg (1 mg Intravenous Given 04/14/21 2019)  iohexol (OMNIPAQUE) 350 MG/ML injection 80 mL (80 mLs Intravenous Contrast Given 04/14/21 2022)    ED Course  I have reviewed the triage vital signs and the nursing notes.  Pertinent labs & imaging results that were available during my care of the patient were reviewed by me and considered in my medical decision making (see chart for details).    MDM Rules/Calculators/A&P                           Julie Bennett is a 45 y.o. female here presenting with left-sided neck pain and left facial droop.  Unclear if she has muscle spasms or dystonic reaction causing her symptoms.  However given acute onset and the fact that she has a hard time speaking, will activate code stroke and consult neurology.  We will get emergent CTA head and neck we will get labs.  We will also give Ativan   10:16 PM Telemetry neurology saw patient.  CTA head and neck unremarkable.  I think likely muscle spasms.  Presentation not consistent with stroke or seizure.  Patient received 2 doses of Ativan and now is able to move her neck.  Her CK level is only 370.  Her potassium is slightly low but was supplemented.  They do not think it is a dystonic reaction as patient is not on any antipsychotics.  There is no signs of NMS.  Since Ativan improved her symptoms, will prescribe short course of Ativan as needed.  Told her to continue to avoid CBD Gummies.  Likely side effect of it  versus torticollis.     Final Clinical Impression(s) / ED Diagnoses Final diagnoses:  None    Rx / DC Orders ED Discharge Orders     None        Charlynne Pander, MD 04/14/21 2217

## 2021-05-18 ENCOUNTER — Ambulatory Visit
Admission: EM | Admit: 2021-05-18 | Discharge: 2021-05-18 | Discharge status: Absent without leave | Payer: BC Managed Care – PPO

## 2021-05-20 ENCOUNTER — Ambulatory Visit
Admission: EM | Admit: 2021-05-20 | Discharge: 2021-05-20 | Disposition: A | Discharge status: Home / Self Care | Payer: BC Managed Care – PPO | Attending: Internal Medicine | Admitting: Internal Medicine

## 2021-05-20 ENCOUNTER — Ambulatory Visit (INDEPENDENT_AMBULATORY_CARE_PROVIDER_SITE_OTHER): Payer: BC Managed Care – PPO

## 2021-05-20 ENCOUNTER — Encounter: Payer: Self-pay | Admitting: Emergency Medicine

## 2021-05-20 ENCOUNTER — Other Ambulatory Visit: Payer: Self-pay

## 2021-05-20 DIAGNOSIS — M79641 Pain in right hand: Secondary | ICD-10-CM

## 2021-05-20 DIAGNOSIS — W19XXXA Unspecified fall, initial encounter: Secondary | ICD-10-CM

## 2021-05-20 DIAGNOSIS — M25531 Pain in right wrist: Secondary | ICD-10-CM

## 2021-05-20 DIAGNOSIS — M79601 Pain in right arm: Secondary | ICD-10-CM | POA: Diagnosis not present

## 2021-05-20 IMAGING — DX DG HAND COMPLETE 3+V*R*
3 series · 3 of 3 positions shown · non-contrast
Comparison: None.

CLINICAL DATA: Right hand and wrist pain after fall

EXAM:
RIGHT WRIST - COMPLETE 3+ VIEW; RIGHT HAND - COMPLETE 3+ VIEW

[hand pa]
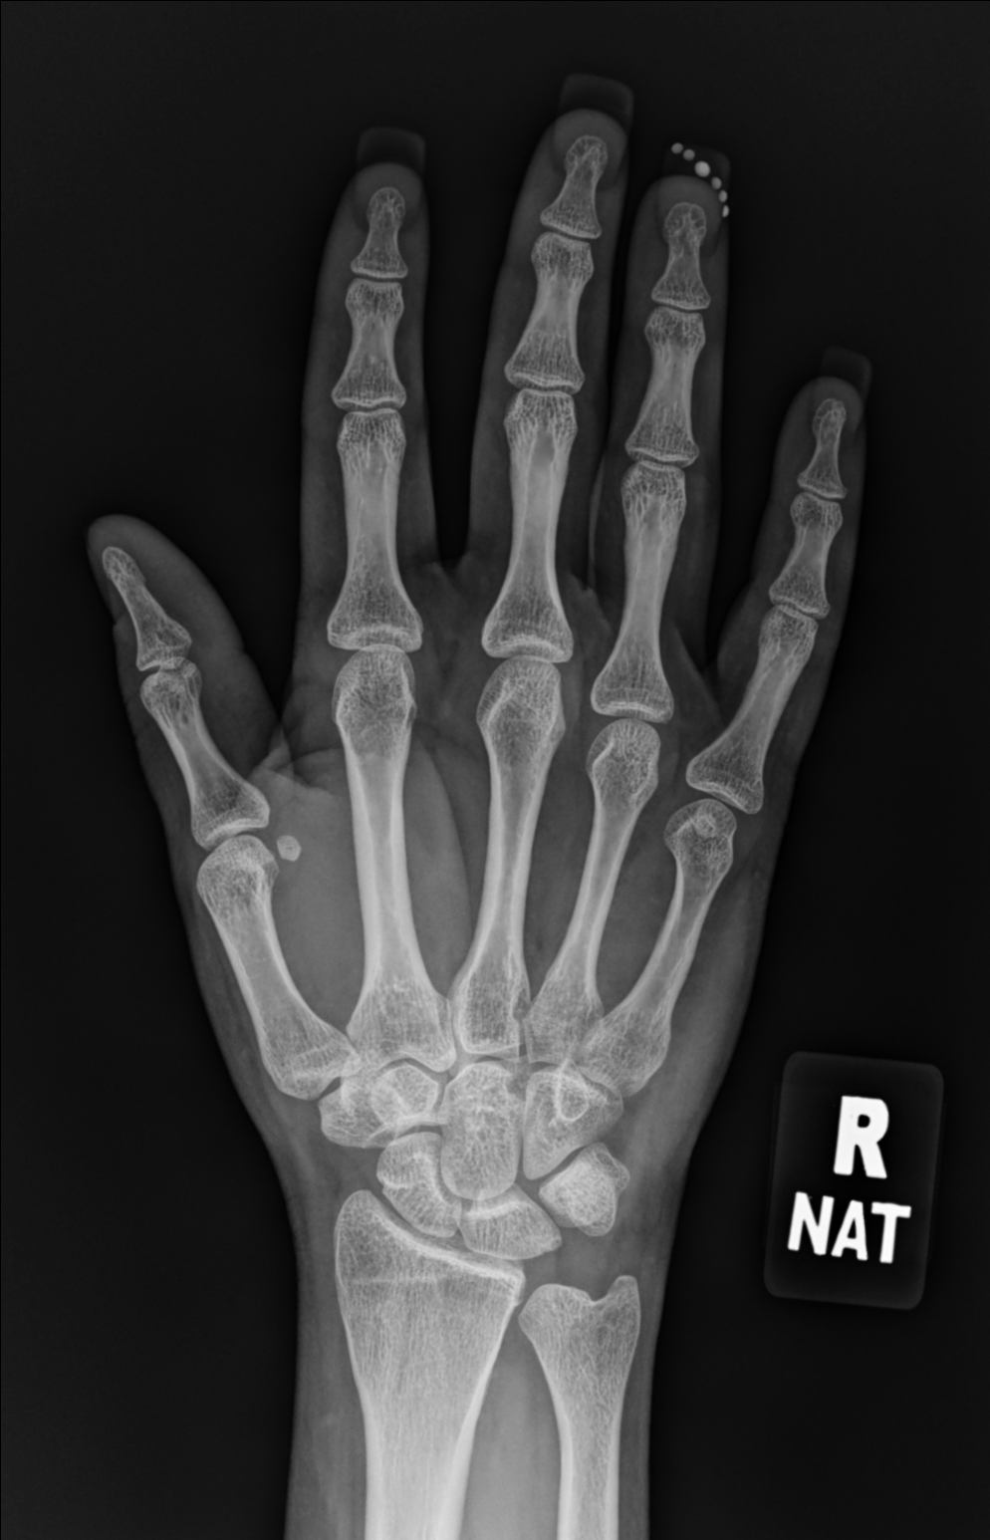

[hand mlo]
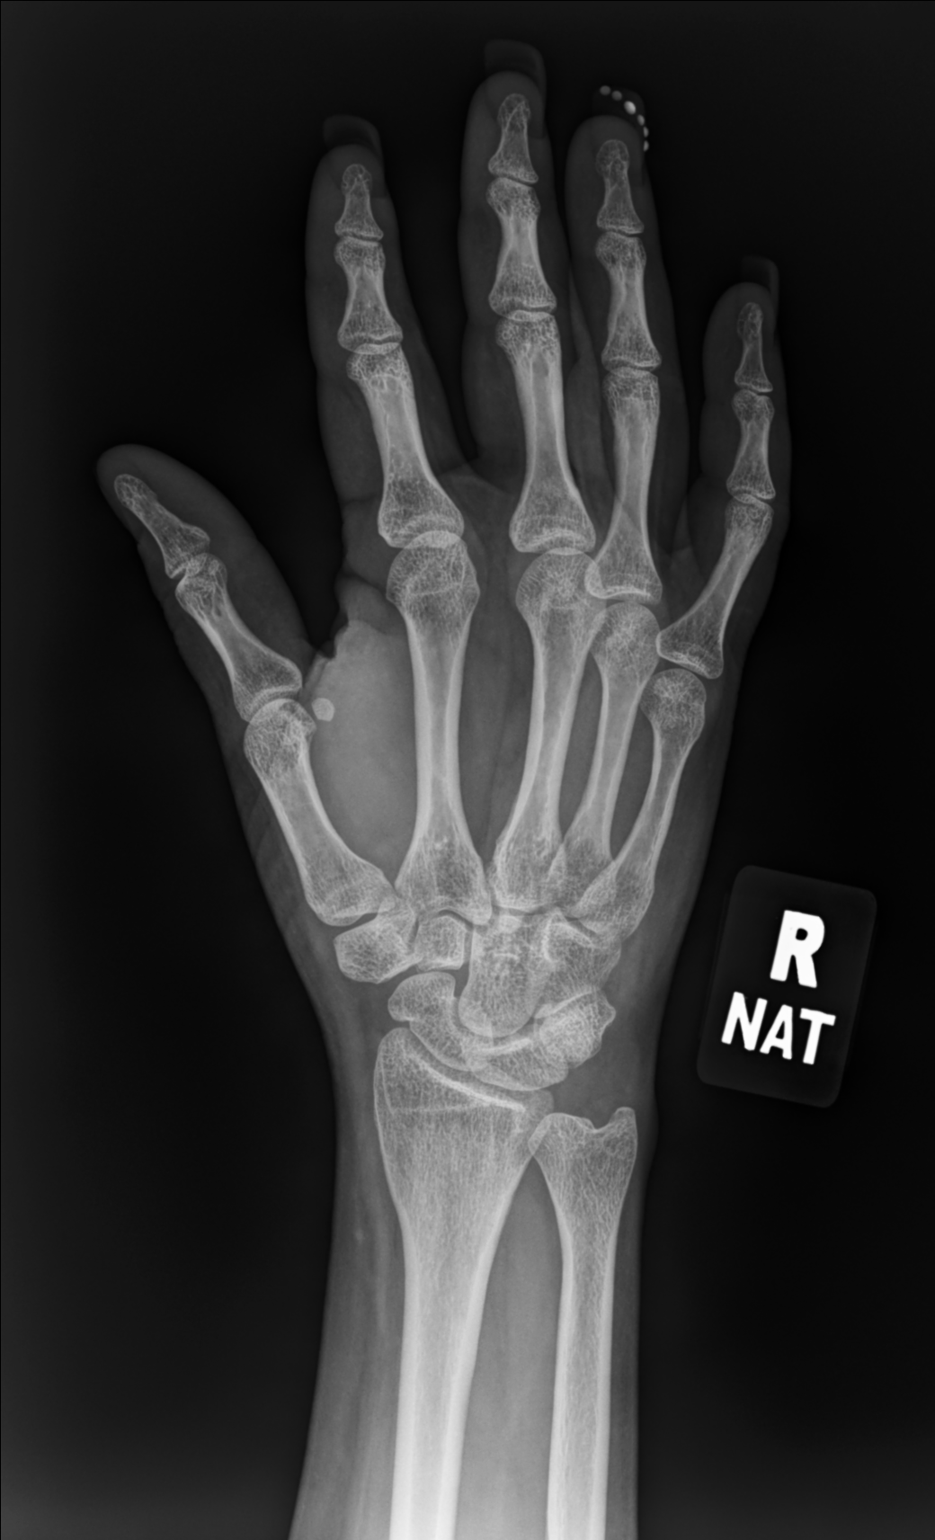

[hand lat]
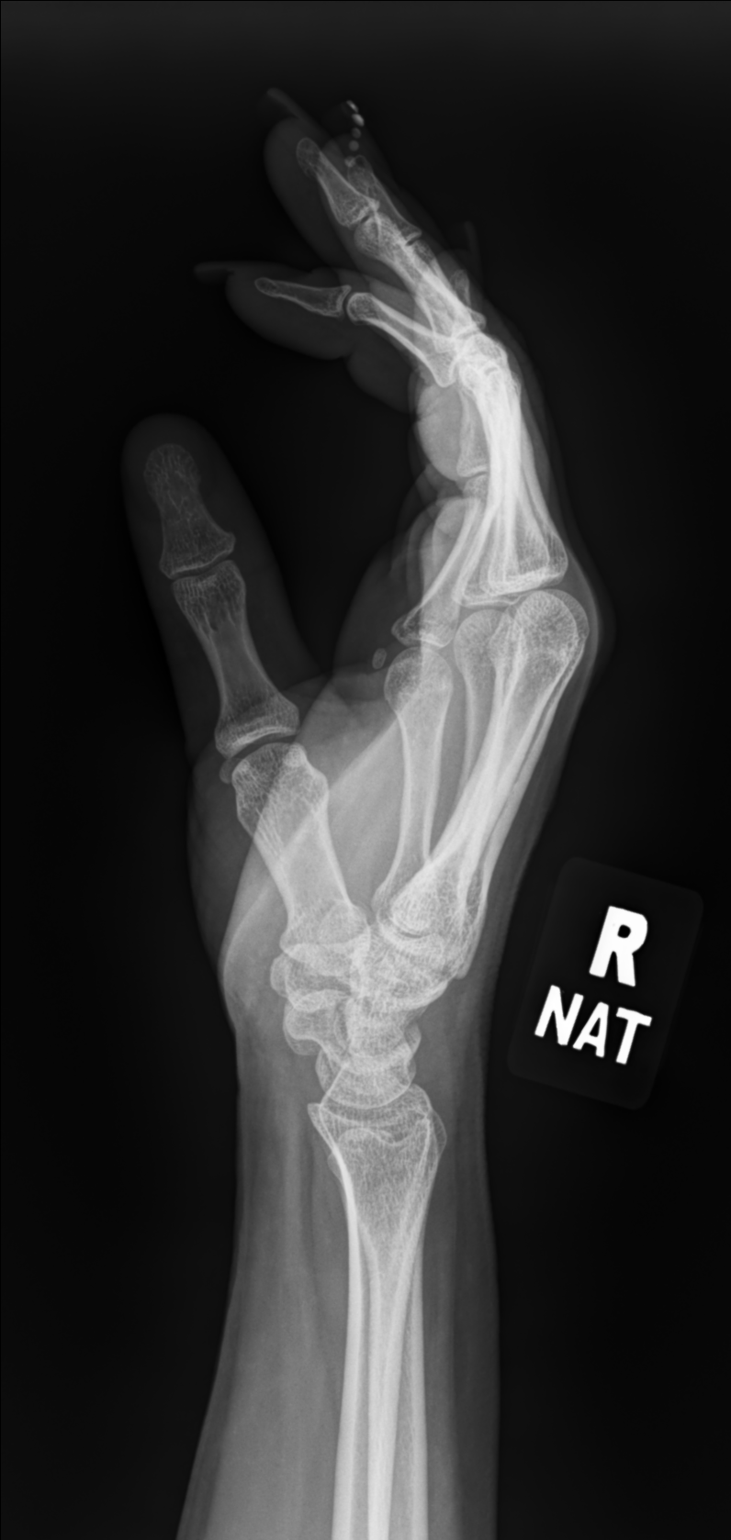

[3 of 3 positions shown; findings below may reference images not displayed]

FINDINGS: There is no evidence of fracture or dislocation of the right hand or
wrist. There is no evidence of arthropathy or other focal bone
abnormality. Soft tissues are unremarkable.
IMPRESSION: Negative.

## 2021-05-20 IMAGING — DX DG WRIST COMPLETE 3+V*R*
4 series · 4 of 4 positions shown · non-contrast
Comparison: None.

CLINICAL DATA: Right hand and wrist pain after fall

EXAM:
RIGHT WRIST - COMPLETE 3+ VIEW; RIGHT HAND - COMPLETE 3+ VIEW

[wrist pa (1 of 3)]
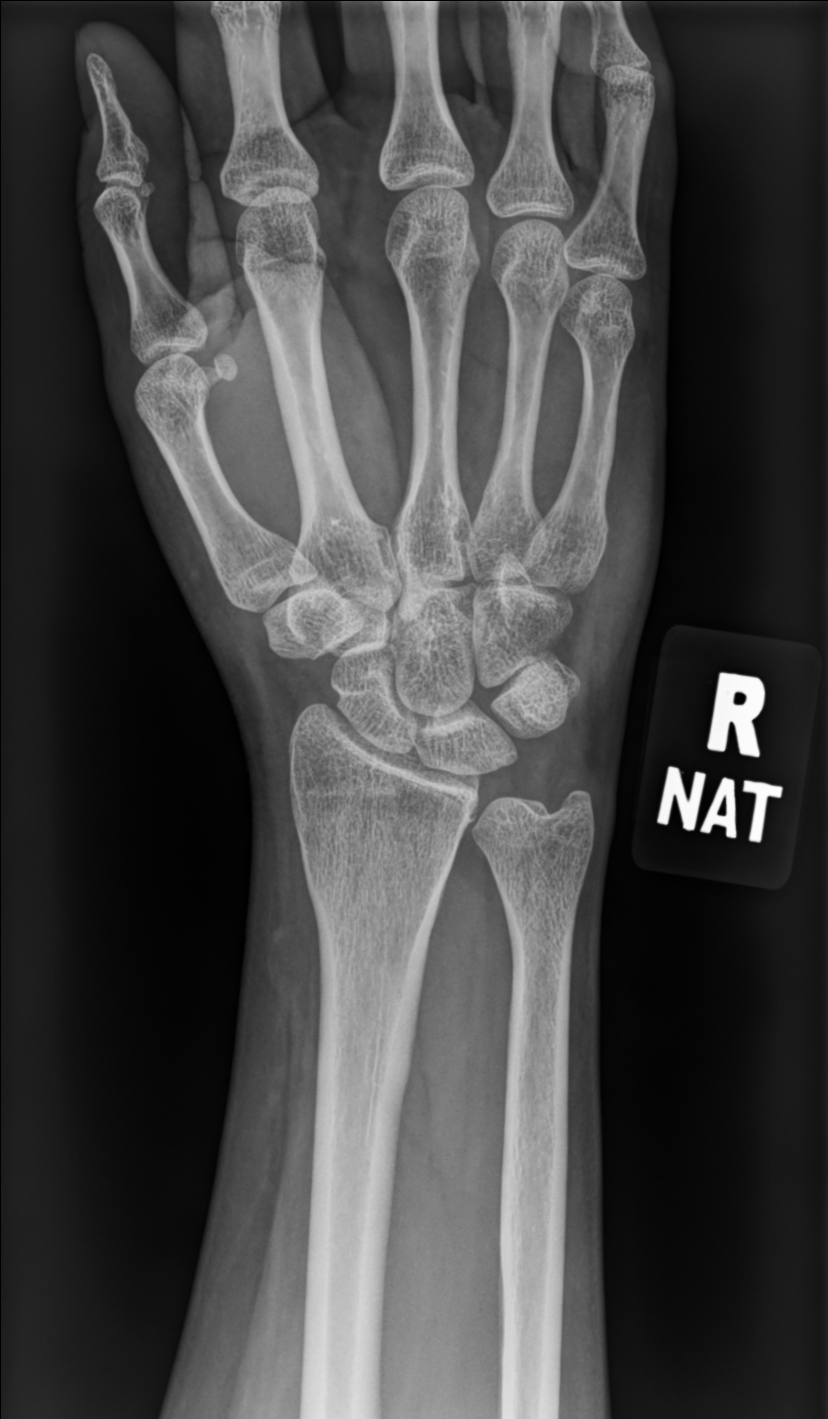

[wrist pa (2 of 3)]
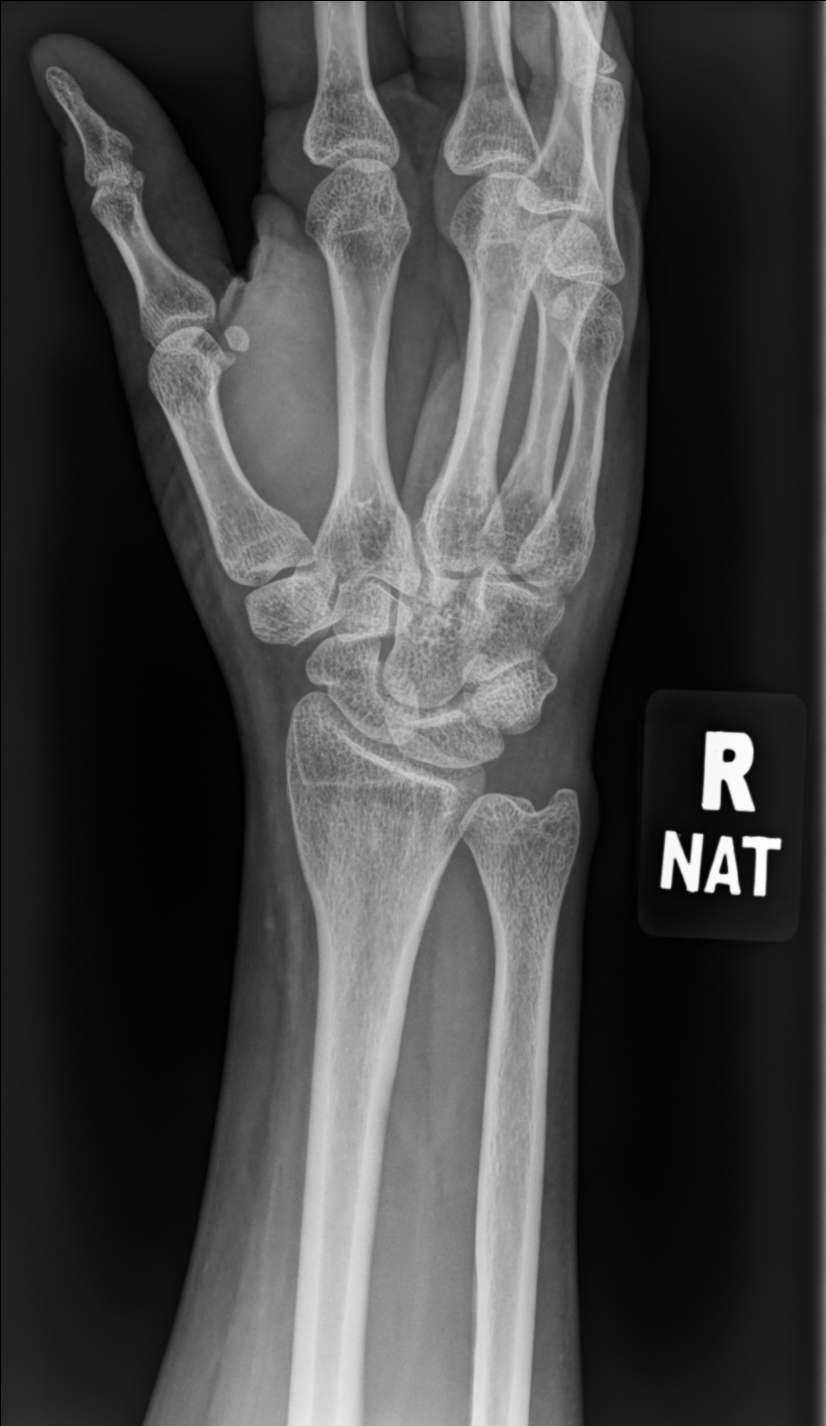

[wrist lat]
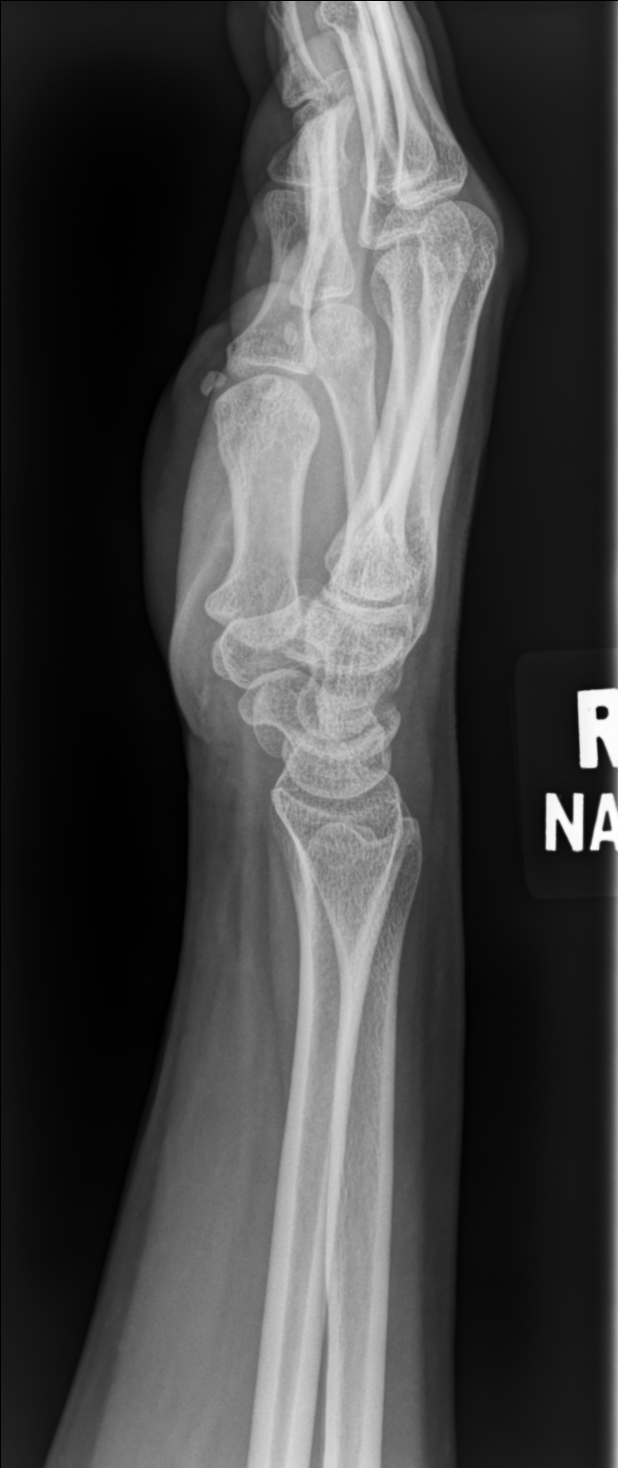

[wrist pa (3 of 3)]
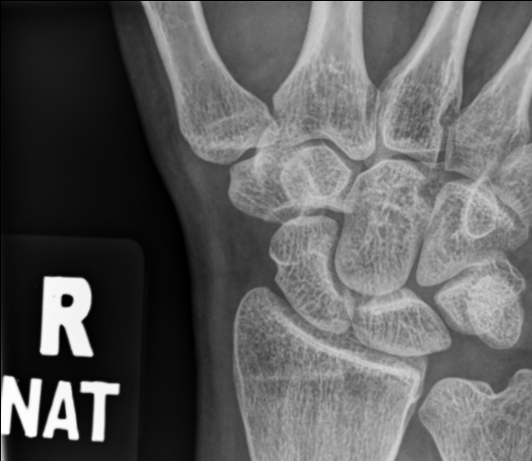

[4 of 4 positions shown; findings below may reference images not displayed]

FINDINGS: There is no evidence of fracture or dislocation of the right hand or
wrist. There is no evidence of arthropathy or other focal bone
abnormality. Soft tissues are unremarkable.
IMPRESSION: Negative.

## 2021-05-20 IMAGING — DX DG HUMERUS 2V *R*
4 series · 4 of 4 positions shown · non-contrast
Comparison: None.

CLINICAL DATA: Right arm pain after fall

EXAM:
RIGHT HUMERUS - 2+ VIEW

[humerus ap (1 of 2)]
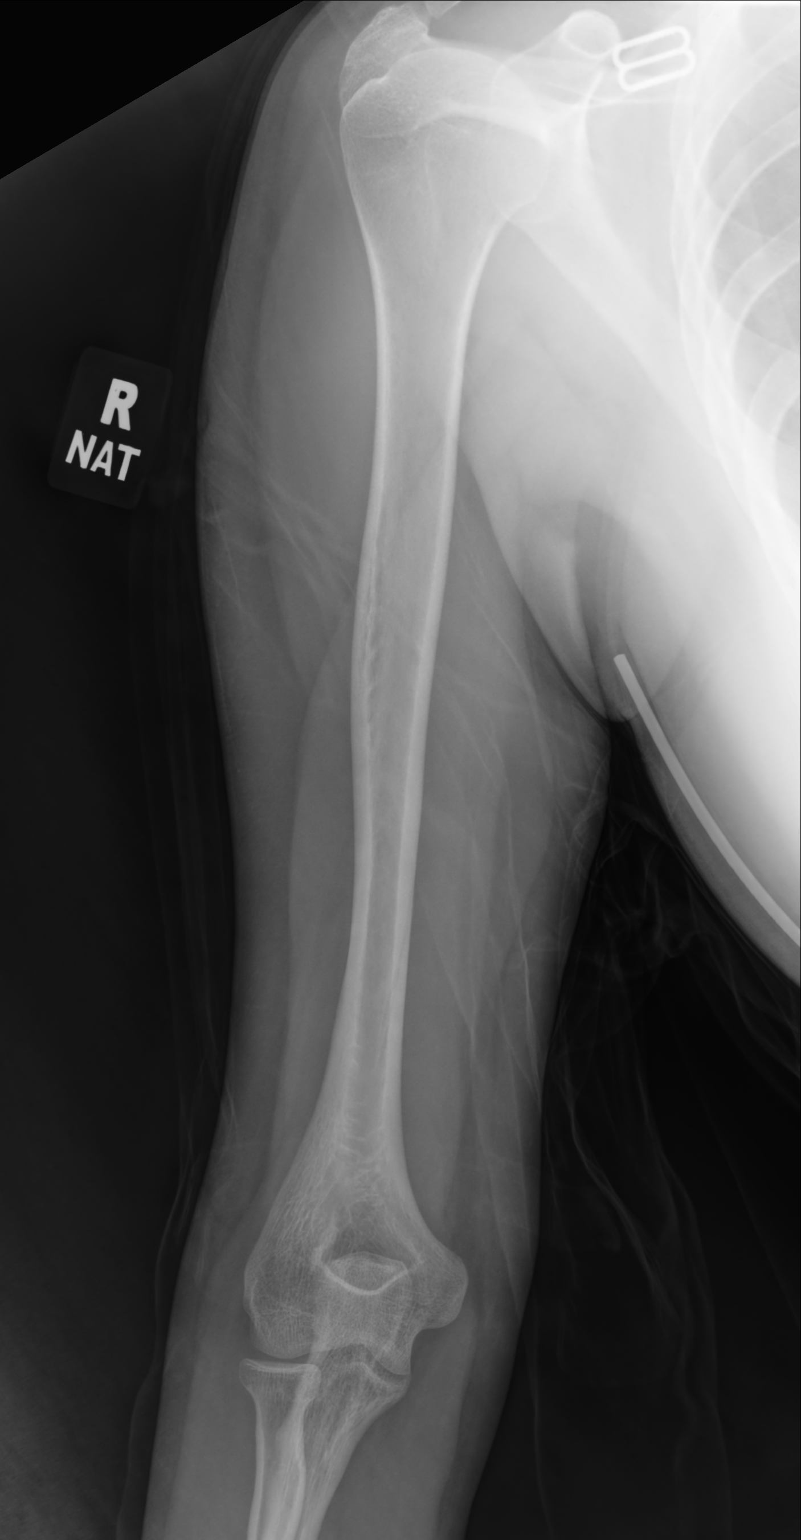

[humerus ap (2 of 2)]
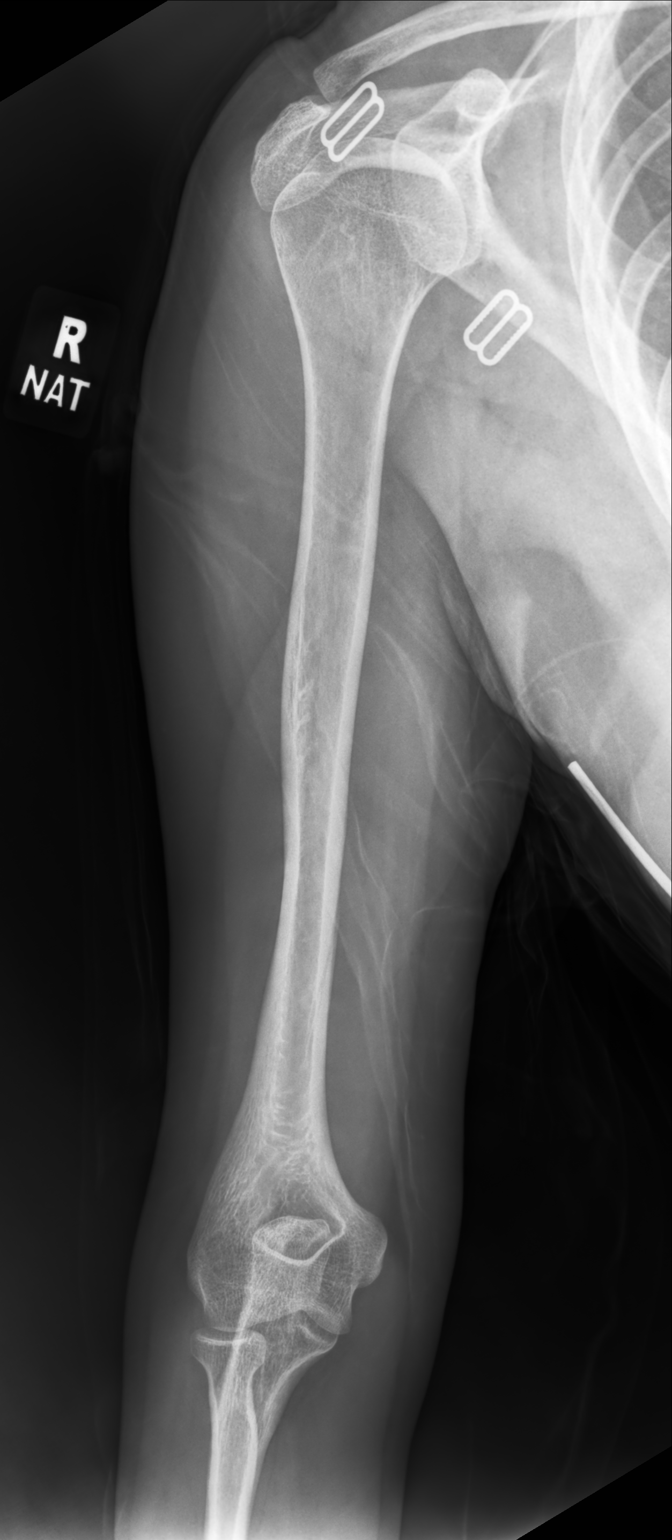

[humerus lat (1 of 2)]
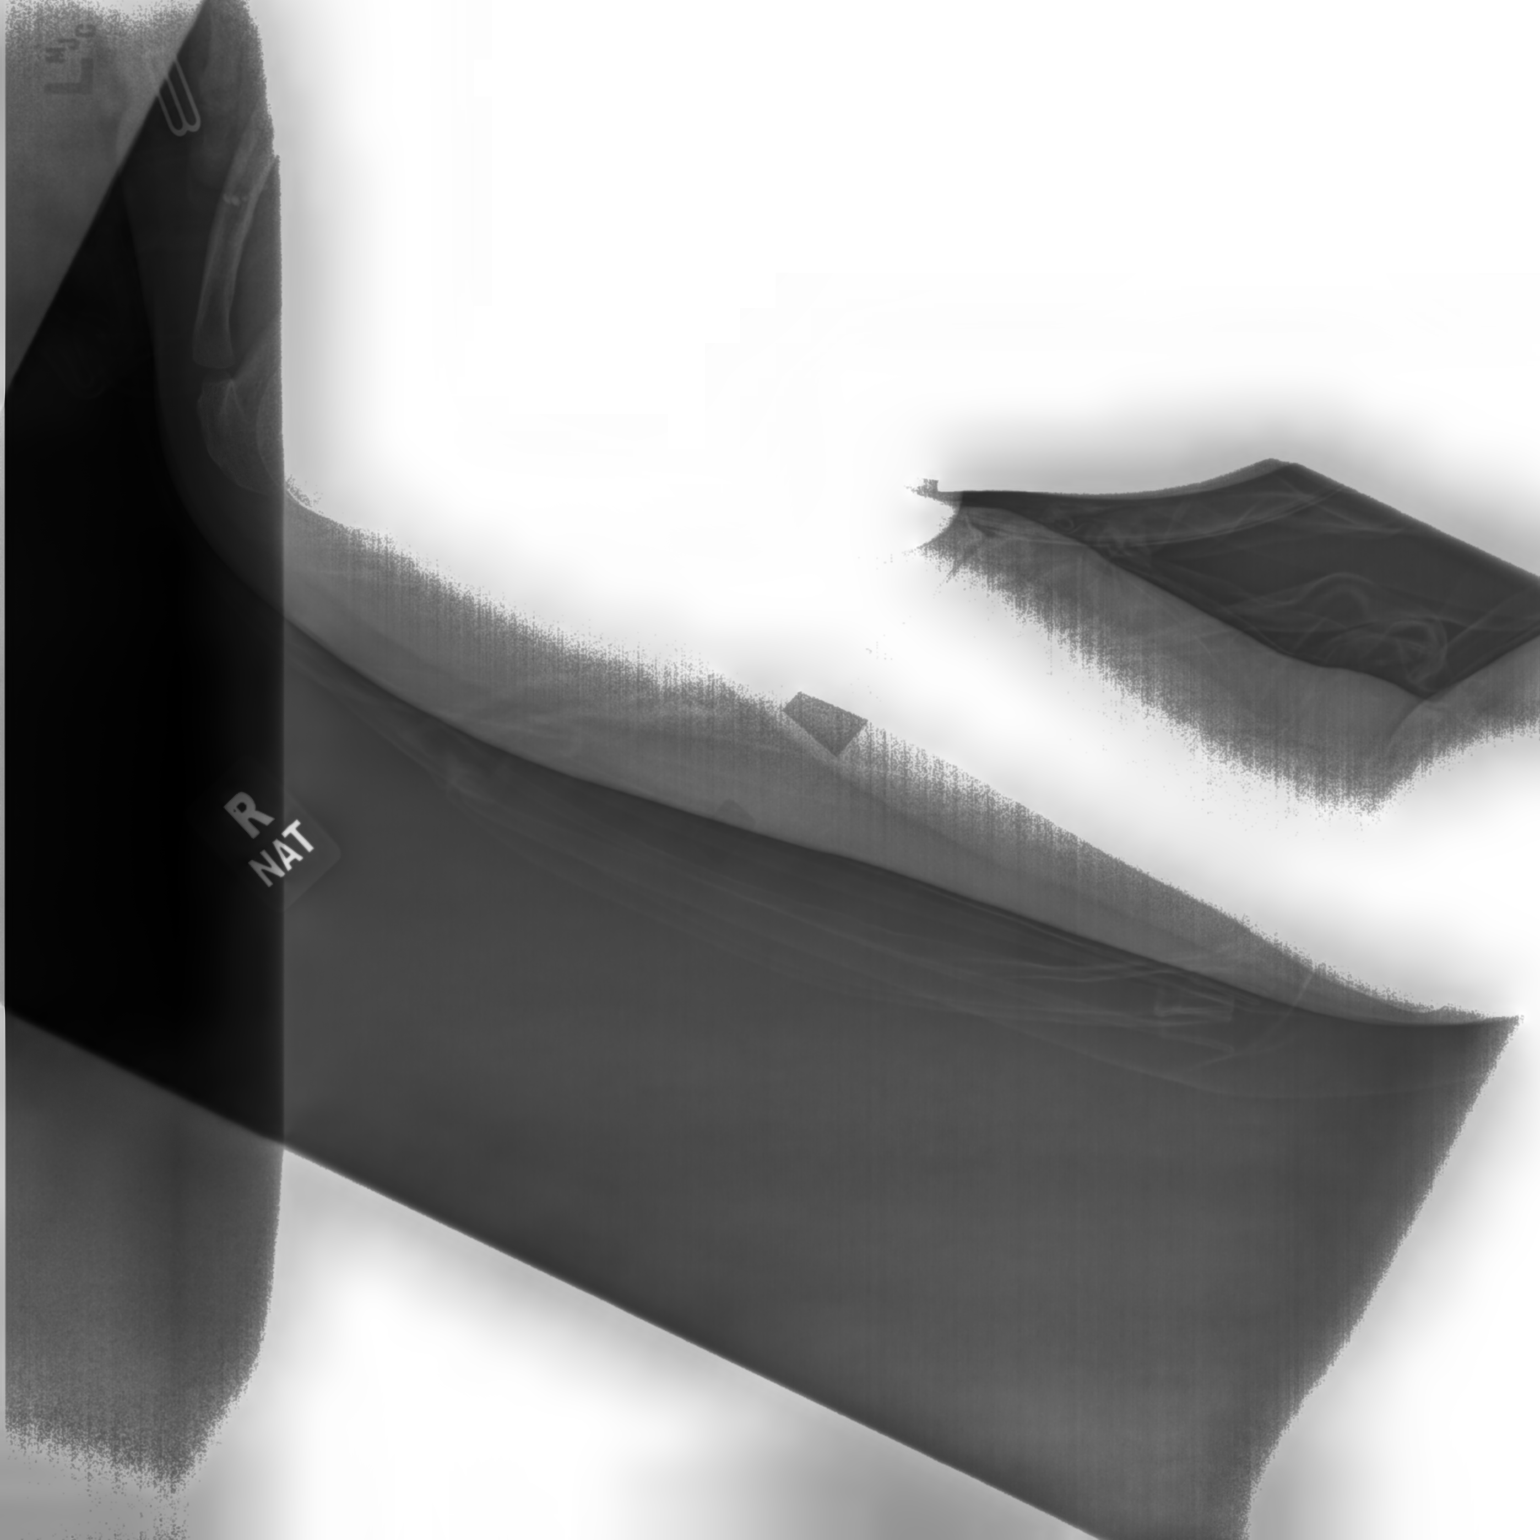

[humerus lat (2 of 2)]
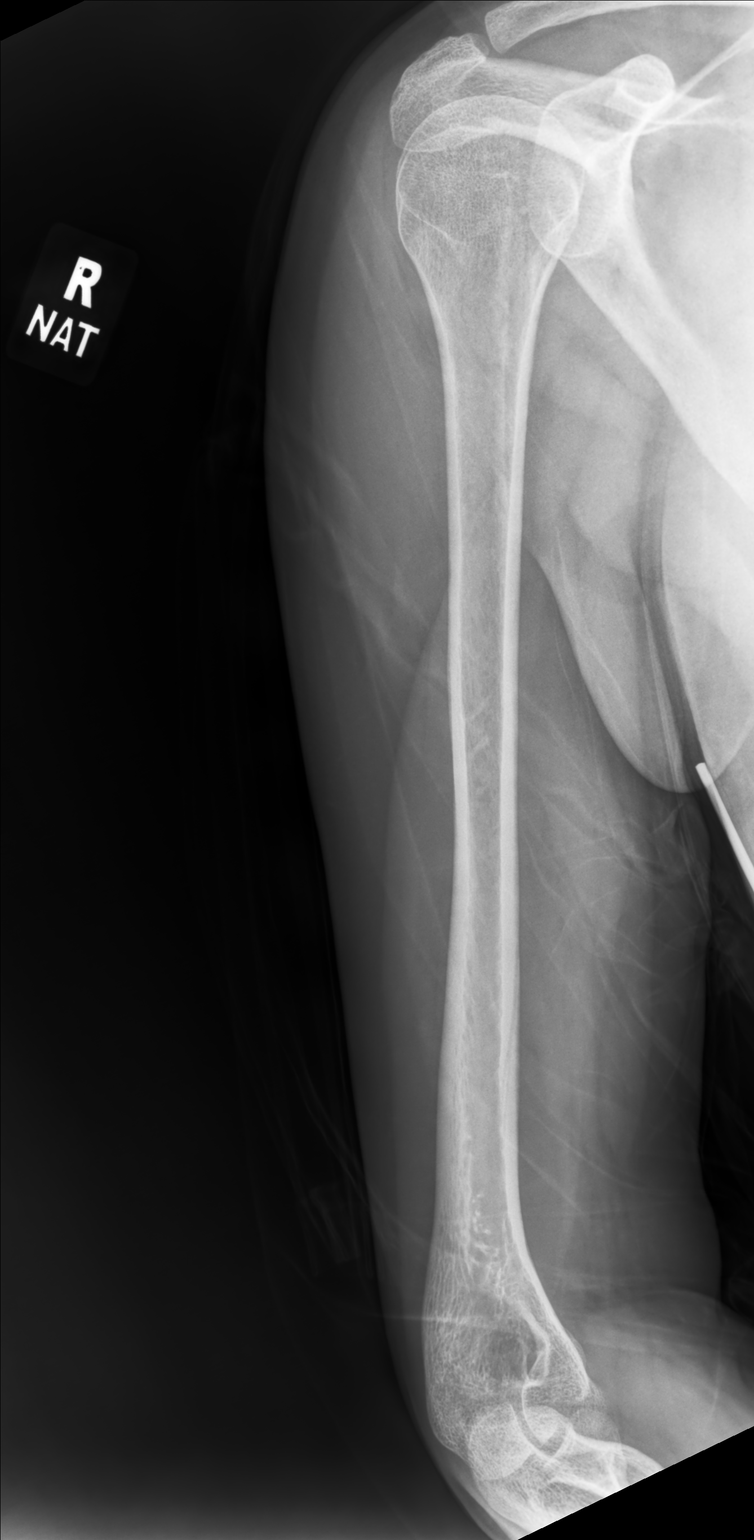

[4 of 4 positions shown; findings below may reference images not displayed]

FINDINGS: There is no evidence of fracture or other focal bone lesions. Soft
tissues are unremarkable.
IMPRESSION: Negative.

## 2021-05-20 NOTE — ED Triage Notes (Signed)
Patient fell last Monday.  Patient fell going up steps.  Patient landed on palm, outstretched fingers.  Pain in palm and wrist radiating into right arm.  Right radial pulse 2 +

## 2021-05-20 NOTE — Discharge Instructions (Signed)
Your x-rays are all negative.  Please use ice application.  Follow-up with provided contact formation for orthopedist if pain persists or worsens.

## 2021-05-20 NOTE — ED Provider Notes (Signed)
[facility]    CSN: [id number] Arrival date & time: 05/20/21  [id number]      History   Chief Complaint Chief Complaint  Patient presents with   Fall    HPI Julie Bennett is a 45 y.o. female.   Patient presents with right upper extremity pain that started approximately 1 week ago after a fall.  Patient reports that she tripped going up her outside stairs and landed with her hand outstretched.  Patient having pain throughout entire right upper extremity but majority of pain is located in the right wrist, right hand, right upper arm.  Denies any numbness or tingling.  Denies hitting head or losing consciousness during fall.  Denies any pain in any other part of the body.   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      Home Medications    Prior to Admission medications   Medication Sig Start Date End Date Taking? Authorizing Provider  escitalopram (LEXAPRO) 10 MG tablet Take 10 mg by mouth daily.    [provider]  estradiol (ESTRACE) 2 MG tablet Take 2 mg by mouth daily. 07/28/20   [provider]  famotidine (PEPCID) 10 MG tablet Take 1 tablet (10 mg total) by mouth at bedtime. 09/29/20   Sanjuan Dame, MD  LORazepam (ATIVAN) 1 MG tablet Take 1 tablet (1 mg total) by mouth 3 (three) times daily as needed for anxiety. Patient not taking: Reported on 05/20/2021 04/14/21   Drenda Freeze, MD  medroxyPROGESTERone (PROVERA) 5  MG tablet Take 5 mg by mouth daily. 03/11/19   [provider]  nortriptyline (PAMELOR) 10 MG capsule Take 20 mg by mouth at bedtime. Patient not taking: Reported on 05/20/2021 08/13/20   [provider]  pantoprazole (PROTONIX) 40 MG tablet Take 1 tablet (40 mg total) by mouth 2 (two) times daily. 09/28/20   Sanjuan Dame, MD  Vitamin D, Ergocalciferol, (DRISDOL) 1.25 MG (50000 UNIT) CAPS capsule Take 1 capsule by mouth once a week. 07/12/20   [provider]    Family History Family History  Problem Relation Age of Onset   Ovarian cancer Mother    Throat cancer Father     Social History Social History   Tobacco Use   Smoking status: Never   Smokeless tobacco: Never  Vaping Use   Vaping Use: Never used  Substance Use Topics   Alcohol use: No   Drug use: No     Allergies   Patient has no known allergies.   Review of Systems Review of Systems Per HPI  Physical Exam Triage Vital Signs ED Triage Vitals  Enc Vitals Group     BP 05/20/21 0956 113/80     Pulse Rate 05/20/21 0956 82     Resp 05/20/21 0956 18     Temp 05/20/21 0956 98.4 F (36.9  C)     Temp Source 05/20/21 0956 Oral     SpO2 05/20/21 0956 95 %     Weight --      Height --      Head Circumference --      Peak Flow --      Pain Score 05/20/21 0953 4     Pain Loc --      Pain Edu? --      Excl. in GC? --    No data found.  Updated Vital Signs BP 113/80 (BP Location: Left Arm)    Pulse 82    Temp 98.4 F (36.9 C) (Oral)    Resp 18    LMP 08/14/2013 Comment: signed preg waiver 6.26.2020   SpO2 95%   Visual Acuity Right Eye Distance:   Left Eye Distance:   Bilateral Distance:    Right Eye Near:   Left Eye Near:    Bilateral Near:     Physical Exam Constitutional:      General: She is not in acute distress.    Appearance: Normal appearance. She is not toxic-appearing or diaphoretic.  HENT:     Head: Normocephalic and atraumatic.  Eyes:     Extraocular  Movements: Extraocular movements intact.     Conjunctiva/sclera: Conjunctivae normal.  Pulmonary:     Effort: Pulmonary effort is normal.  Musculoskeletal:     Right shoulder: Normal. No swelling, tenderness, bony tenderness or crepitus. Normal strength. Normal pulse.     Left shoulder: Normal.     Right upper arm: Normal.     Left upper arm: Normal.     Right elbow: Normal.     Left elbow: Normal.     Right wrist: Tenderness present. No swelling, deformity, bony tenderness, snuff box tenderness or crepitus. Normal pulse.     Left wrist: Normal.     Right hand: Tenderness present. No swelling or deformity. Normal range of motion. Normal strength. Normal sensation. There is no disruption of two-point discrimination. Normal capillary refill. Normal pulse.     Left hand: Normal.     Comments: Tenderness to palpation generalized throughout right wrist as well as generalized throughout right hand.  Neurovascular intact.  Decreased range of motion and grip strength due to pain.  No direct tenderness to palpation to majority of right upper extremity, although patient is reporting pain that "radiates" up the right upper arm.  Neurological:     General: No focal deficit present.     Mental Status: She is alert and oriented to person, place, and time. Mental status is at baseline.  Psychiatric:        Mood and Affect: Mood normal.        Behavior: Behavior normal.        Thought Content: Thought content normal.        Judgment: Judgment normal.     UC Treatments / Results  Labs (all labs ordered are listed, but only abnormal results are displayed) Labs Reviewed - No data to display  EKG   Radiology DG Wrist Complete Right  Result Date: 05/20/2021 CLINICAL DATA:  Right hand and wrist pain after fall EXAM: RIGHT WRIST - COMPLETE 3+ VIEW; RIGHT HAND - COMPLETE 3+ VIEW COMPARISON:  None. FINDINGS: There is no evidence of fracture or dislocation of the right hand or wrist. There is no  evidence of arthropathy or other focal bone abnormality. Soft tissues are unremarkable. IMPRESSION: Negative. Electronically Signed   By: Duanne Guess  D.O.   On: 05/20/2021 11:18   DG Humerus Right  Result Date: 05/20/2021 CLINICAL DATA:  Right arm pain after fall EXAM: RIGHT HUMERUS - 2+ VIEW COMPARISON:  None. FINDINGS: There is no evidence of fracture or other focal bone lesions. Soft tissues are unremarkable. IMPRESSION: Negative. Electronically Signed   By: Davina Poke D.O.   On: 05/20/2021 11:16   DG Hand Complete Right  Result Date: 05/20/2021 CLINICAL DATA:  Right hand and wrist pain after fall EXAM: RIGHT WRIST - COMPLETE 3+ VIEW; RIGHT HAND - COMPLETE 3+ VIEW COMPARISON:  None. FINDINGS: There is no evidence of fracture or dislocation of the right hand or wrist. There is no evidence of arthropathy or other focal bone abnormality. Soft tissues are unremarkable. IMPRESSION: Negative. Electronically Signed   By: Davina Poke D.O.   On: 05/20/2021 11:18    Procedures Procedures (including critical care time)  Medications Ordered in UC Medications - No data to display  Initial Impression / Assessment and Plan / UC Course  I have reviewed the triage vital signs and the nursing notes.  Pertinent labs & imaging results that were available during my care of the patient were reviewed by me and considered in my medical decision making (see chart for details).     All x-rays were negative for any acute bony abnormality.  Suspect strain or contusion.  Discussed supportive care and ice application.  Discussed over-the-counter pain relievers with patient.  Patient provided with contact information for orthopedist to follow-up if pain persists or worsens.  Discussed return precautions.  Patient verbalized understanding and was agreeable with plan. Final Clinical Impressions(s) / UC Diagnoses   Final diagnoses:  Right hand pain  Right wrist pain  Fall, initial encounter      Discharge Instructions      Your x-rays are all negative.  Please use ice application.  Follow-up with provided contact formation for orthopedist if pain persists or worsens.     ED Prescriptions   None    PDMP not reviewed this encounter.   Teodora Medici, [place] 05/20/21 1131

## 2021-05-22 ENCOUNTER — Other Ambulatory Visit: Payer: Self-pay

## 2021-05-22 ENCOUNTER — Ambulatory Visit (INDEPENDENT_AMBULATORY_CARE_PROVIDER_SITE_OTHER): Payer: BC Managed Care – PPO | Admitting: Neurology

## 2021-05-22 ENCOUNTER — Encounter: Payer: Self-pay | Admitting: Neurology

## 2021-05-22 VITALS — BP 123/85 | HR 74 | Ht 65.0 in | Wt 166.0 lb

## 2021-05-22 DIAGNOSIS — R569 Unspecified convulsions: Secondary | ICD-10-CM | POA: Insufficient documentation

## 2021-05-22 DIAGNOSIS — G43709 Chronic migraine without aura, not intractable, without status migrainosus: Secondary | ICD-10-CM | POA: Diagnosis not present

## 2021-05-22 MED ORDER — RIZATRIPTAN BENZOATE 10 MG PO TBDP: 10.0000 mg | ORAL_TABLET | ORAL | 6 refills | Status: DC | PRN

## 2021-05-22 MED ORDER — ZONISAMIDE 100 MG PO CAPS: 100.0000 mg | ORAL_CAPSULE | Freq: Every day | ORAL | 11 refills | Status: DC

## 2021-05-22 NOTE — Progress Notes (Signed)
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 female   Recurrent episode of body tensing up, seizure-like activity without loss of consciousness,  Semiology does not support a diagnosis of seizure,  EEG Frequent migraine headaches  Zonegran 100 mg every night as migraine prevention  Maxalt as needed   DIAGNOSTIC DATA (LABS, IMAGING, TESTING) - I reviewed patient records, labs, notes, testing and imaging myself where available.   MEDICAL HISTORY:  Julie Bennett is a 45 year old female, seen in request by Dr. Silverio Lay, Gonzella Lex, for evaluation of Zoloft sent onset difficulty moving her body, her primary care physician is Dr. Leavy Cella, Leanora Cover, MD, initial evaluation was on May 22, 2021  I reviewed and summarized the referring note. PMHX Depression, anxiety, Lexapro 10 mg daily Chronic migraine headaches  She reported episodes of sudden onset difficulty moving her body, this happened on November 19, 21, May 19, 2021, each episodes are similar,  She described episode on May 19, 2021 in detail, went to his in-laws house to celebrate Christmas,  had sudden onset body weakness, could not move, whole body went numb, she started crying uncontrollably, jaw twisted to the left side, locked out, lasting 2 to 3 minutes, has to be helped in a sitting down position, then had limb jerking movement, trunk up-and-down movement, without loss of consciousness, paramedic was called, but was not treated at emergency room,  Whole episode lasted for more than 1 hour, had to 3 similar episode within this 1 h   Emergency presentation of April 14, 2021, muscle stiffness, slurred speech, reported sudden onset, rigid, was given  Benadryl, symptoms improved afterwards, also reported episode of outside emergency room visit in November 2022, left she took CBD Gummies,  told she had dystonia reaction, she was giving Ativan, symptoms recruit improved afterwards.  EKG was normal on April 14, 2021, laboratory evaluation showed negative alcohol, CMP creatinine 1.08, potassium 3.0, CPK was mildly elevated at 374, creatinine 1.1, hemoglobin of 13.3,  She also reported history of migraine headaches, lateralized severe pounding headache, above described each episode and associated with headache on the right side, relieved after taking Tylenol, Motrin, on average she has migraine about once a week  I personally reviewed CT head without contrast April 14, 2021, no significant abnormality  CT angiogram of head and neck was normal  PHYSICAL EXAM:   Vitals:   05/22/21 1330  BP: 123/85  Pulse: 74  Weight: 166 lb (75.3 kg)  Height: 5\' 5"  (1.651 m)   Not recorded     Body mass index is 27.62 kg/m.  PHYSICAL EXAMNIATION:  Gen: NAD, conversant, well nourised, well groomed                     Cardiovascular: Regular rate rhythm, no peripheral edema, warm, nontender. Eyes: Conjunctivae clear without exudates or hemorrhage Neck: Supple, no carotid bruits. Pulmonary: Clear to auscultation bilaterally   NEUROLOGICAL EXAM:  MENTAL STATUS: Speech:    Speech is normal; fluent and spontaneous with normal comprehension.  Cognition:     Orientation to time, place and person     Normal recent and remote memory     Normal Attention  span and concentration     Normal Language, naming, repeating,spontaneous speech     Fund of knowledge   CRANIAL NERVES: CN II: Visual fields are full to confrontation. Pupils are round equal and briskly reactive to light. CN III, IV, VI: extraocular movement are normal. No ptosis. CN V: Facial sensation is intact to light touch CN VII: Face is symmetric with normal eye closure  CN VIII:  Hearing is normal to causal conversation. CN IX, X: Phonation is normal. CN XI: Head turning and shoulder shrug are intact  MOTOR: There is no pronator drift of out-stretched arms. Muscle bulk and tone are normal. Muscle strength is normal.  REFLEXES: Reflexes are 2+ and symmetric at the biceps, triceps, knees, and ankles. Plantar responses are flexor.  SENSORY: Intact to light touch, pinprick and vibratory sensation are intact in fingers and toes.  COORDINATION: There is no trunk or limb dysmetria noted.  GAIT/STANCE: Posture is normal. Gait is steady with normal steps, base, arm swing, and turning. Heel and toe walking are normal. Tandem gait is normal.  Romberg is absent.  REVIEW OF SYSTEMS:  Full 14 system review of systems performed and notable only for as above All other review of systems were negative.   ALLERGIES: No Known Allergies  HOME MEDICATIONS: Current Outpatient Medications  Medication Sig Dispense Refill   escitalopram (LEXAPRO) 10 MG tablet Take 10 mg by mouth daily.     estradiol (ESTRACE) 2 MG tablet Take 2 mg by mouth daily.     famotidine (PEPCID) 10 MG tablet Take 1 tablet (10 mg total) by mouth at bedtime. 30 tablet 0   LORazepam (ATIVAN) 1 MG tablet Take 1 tablet (1 mg total) by mouth 3 (three) times daily as needed for anxiety. 10 tablet 0   medroxyPROGESTERone (PROVERA) 5 MG tablet Take 5 mg by mouth daily.     Vitamin D, Ergocalciferol, (DRISDOL) 1.25 MG (50000 UNIT) CAPS capsule Take 1 capsule by mouth once a week.     No current facility-administered medications for this visit.    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    PAST SURGICAL HISTORY: Past Surgical History:  Procedure Laterality Date   DILATION AND CURETTAGE OF UTERUS     hsyteroscopy     LAPAROSCOPY     TUBAL LIGATION      FAMILY HISTORY: Family History  Problem  Relation Age of Onset   Ovarian cancer Mother    Throat cancer Father     SOCIAL HISTORY: Social History   Socioeconomic History   Marital status: Married    Spouse name: Not on file   Number of children: Not on file   Years of education: Not on file   Highest education level: Not on file  Occupational History   Not on file  Tobacco Use   Smoking status: Never   Smokeless tobacco: Never  Vaping Use   Vaping Use: Never used  Substance and Sexual Activity   Alcohol use: No   Drug use: No   Sexual activity: Yes    Birth control/protection: None  Other Topics Concern   Not on file  Social History Narrative   Not on file   Social Determinants of Health   Financial Resource Strain: Not on file  Food Insecurity: Not on file  Transportation Needs: Not on file  Physical Activity: Not  on file  Stress: Not on file  Social Connections: Not on file  Intimate Partner Violence: Not on file      Levert Feinstein, M.D. Ph.D.  [facility] [address] [place], [place] [address] Ph: [phone] Fax: [phone]  CC:  Charlynne Pander, MD [address] [place],  [place] [address]  Verlon Au, MD

## 2021-05-23 ENCOUNTER — Ambulatory Visit: Payer: BC Managed Care – PPO | Admitting: Neurology

## 2021-05-28 ENCOUNTER — Encounter: Payer: Self-pay | Admitting: Orthopedic Surgery

## 2021-05-28 ENCOUNTER — Encounter: Payer: Self-pay | Admitting: Neurology

## 2021-05-28 ENCOUNTER — Other Ambulatory Visit: Payer: Self-pay

## 2021-05-28 ENCOUNTER — Ambulatory Visit (INDEPENDENT_AMBULATORY_CARE_PROVIDER_SITE_OTHER): Payer: BC Managed Care – PPO | Admitting: Orthopedic Surgery

## 2021-05-28 ENCOUNTER — Ambulatory Visit: Payer: Self-pay

## 2021-05-28 VITALS — BP 118/71 | HR 72

## 2021-05-28 DIAGNOSIS — M79641 Pain in right hand: Secondary | ICD-10-CM | POA: Diagnosis not present

## 2021-05-28 DIAGNOSIS — R200 Anesthesia of skin: Secondary | ICD-10-CM

## 2021-05-28 DIAGNOSIS — M79642 Pain in left hand: Secondary | ICD-10-CM

## 2021-05-28 MED ORDER — MELOXICAM 7.5 MG PO TABS: 7.5000 mg | ORAL_TABLET | Freq: Every day | ORAL | 0 refills | Status: DC

## 2021-05-28 NOTE — Progress Notes (Signed)
Office Visit Note   Patient: Julie Bennett           Date of Birth: [birth year]           MRN: [id number] Visit Date: 05/28/2021              Requested by: Verlon Au, MD [address] [place],  [place] [address] PCP: Verlon Au, MD   Assessment & Plan: Visit Diagnoses:  1. Pain in left hand   2. Bilateral hand numbness   3. Pain in right hand     Plan: Discussed the utility of getting an EMG/nerve conduction study to evaluate the bilateral hand numbness.  She has some features consistent with carpal cubital tunnel syndrome including positive provocative signs but the history does not quite fit either diagnosis.  She did not describe any overt radiculopathy.  We discussed that her right hand pain following the fall is likely soft tissue contusion as the x-rays were negative for any fracture and she has no evidence of instability on exam.  She is not taking anything for pain or inflammation so we will start her on p.o. meloxicam.  I can see her back in the office after the nerve study is completed.  Follow-Up Instructions: No follow-ups on file.   Orders:  Orders Placed This Encounter  Procedures   XR Hand Complete Left   Ambulatory referral to Physical Medicine Rehab   Meds ordered this encounter  Medications   meloxicam (MOBIC) 7.5 MG tablet    Sig: Take 1 tablet (7.5 mg total) by mouth daily.    Dispense:  30 tablet    Refill:  0      Procedures: No procedures performed   Clinical Data: No additional findings.   Subjective: Chief Complaint  Patient presents with   Right Hand - New Patient (Initial Visit)   Left Hand - New Patient (Initial Visit)    This is a 46 year old right-hand-dominant female who presents with multiple complaints involving both hands.  She says that she fell approximately 3 weeks ago on her outstretched right hand and since has had pain in this hand.  The pain is vague and poorly localized.  She  describes pain at the dorsal and palmar aspects of the mid portion of the hand.  She has some pain in the ulnar side of the wrist.  She notes that she has decreased grip strength and has difficulty holding objects since the injury.  She also describes numbness in both hands involving all of her fingers.  She does notice actually starts up at the elbow and radiates down her entire forearm into her hand.  She denies any radiculopathy.  She denies any history of cervical spine issues.  She denies any nocturnal symptoms.    Review of Systems   Objective: Vital Signs: BP 118/71 (BP Location: Left Arm, Patient Position: Sitting, Cuff Size: Large)    Pulse 72    LMP 08/14/2013 Comment: signed preg waiver 6.26.2020   SpO2 96%   Physical Exam Constitutional:      Appearance: Normal appearance.  Cardiovascular:     Rate and Rhythm: Normal rate.     Pulses: Normal pulses.  Pulmonary:     Effort: Pulmonary effort is normal.  Skin:    General: Skin is warm and dry.     Capillary Refill: Capillary refill takes less than 2 seconds.  Neurological:     Mental Status: She is alert.  Right Hand Exam   Tenderness  Right hand tenderness location: TTP at dorsal aspect of hand in line with 3rd metcarpal.  She has some tenderness to palpapation at the middle aspect of her palm. Minimal swelling.  Range of Motion  The patient has normal right wrist ROM.   Other  Erythema: absent Sensation: normal Pulse: present  Comments:  Able to make a complete fist but with some weakness.  Full ROM of wrist.  No overt foveal tendereness.  No DRUJ instability.  Full and painless pronation/supination.  No pain w/ ulnocarpal loading.   + Tinel at elbow.  Negative Phalen/Tinel/CT compression at wrist.    Left Hand Exam   Comments:  + Tinel at elbow.  + Phalen sign into middle finger.  Negative Tinel and CT compression test at wrist.      Specialty Comments:  No specialty comments  available.  Imaging: Multiple views of the right hand and wrist taken on 12/26 are reviewed interpreted by me.  They do not demonstrate any evidence of acute fracture or bony injury.  No evidence of instability or dislocation.  No evidence of significant degenerative change.  Multiple views of the left hand taken today are also reviewed interpreted by me.  They demonstrate no acute bony injury or instability.  There is no significant degenerative changes present.   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

## 2021-05-29 ENCOUNTER — Emergency Department (HOSPITAL_COMMUNITY)
Admission: EM | Admit: 2021-05-29 | Discharge: 2021-05-29 | Disposition: A | Discharge status: Home / Self Care | Payer: BC Managed Care – PPO | Attending: Emergency Medicine | Admitting: Emergency Medicine

## 2021-05-29 ENCOUNTER — Telehealth: Payer: Self-pay | Admitting: Neurology

## 2021-05-29 ENCOUNTER — Emergency Department (HOSPITAL_COMMUNITY): Payer: BC Managed Care – PPO

## 2021-05-29 ENCOUNTER — Other Ambulatory Visit: Payer: Self-pay

## 2021-05-29 ENCOUNTER — Encounter (HOSPITAL_COMMUNITY): Payer: Self-pay | Admitting: Emergency Medicine

## 2021-05-29 DIAGNOSIS — R519 Headache, unspecified: Secondary | ICD-10-CM | POA: Insufficient documentation

## 2021-05-29 DIAGNOSIS — R569 Unspecified convulsions: Secondary | ICD-10-CM | POA: Insufficient documentation

## 2021-05-29 DIAGNOSIS — R079 Chest pain, unspecified: Secondary | ICD-10-CM | POA: Insufficient documentation

## 2021-05-29 DIAGNOSIS — R110 Nausea: Secondary | ICD-10-CM | POA: Diagnosis not present

## 2021-05-29 DIAGNOSIS — Z79899 Other long term (current) drug therapy: Secondary | ICD-10-CM | POA: Diagnosis not present

## 2021-05-29 HISTORY — DX: Unspecified convulsions: R56.9

## 2021-05-29 LAB — BASIC METABOLIC PANEL
Anion gap: 7 (ref 5–15)
BUN: 13 mg/dL (ref 6–20)
CO2: 23 mmol/L (ref 22–32)
Calcium: 9.1 mg/dL (ref 8.9–10.3)
Chloride: 107 mmol/L (ref 98–111)
Creatinine, Ser: 1.15 mg/dL — ABNORMAL HIGH (ref 0.44–1.00)
GFR, Estimated: 60 mL/min — ABNORMAL LOW (ref >60)
Glucose, Bld: 91 mg/dL (ref 70–99)
Potassium: 3.9 mmol/L (ref 3.5–5.1)
Sodium: 137 mmol/L (ref 135–145)

## 2021-05-29 LAB — CBC
HCT: 39.2 % (ref 36.0–46.0)
Hemoglobin: 13.1 g/dL (ref 12.0–15.0)
MCH: 30.2 pg (ref 26.0–34.0)
MCHC: 33.4 g/dL (ref 30.0–36.0)
MCV: 90.3 fL (ref 80.0–100.0)
Platelets: 276 10*3/uL (ref 150–400)
RBC: 4.34 MIL/uL (ref 3.87–5.11)
RDW: 12.7 % (ref 11.5–15.5)
WBC: 7.9 10*3/uL (ref 4.0–10.5)
nRBC: 0 % (ref 0.0–0.2)

## 2021-05-29 LAB — TROPONIN I (HIGH SENSITIVITY)
Troponin I (High Sensitivity): 3 ng/L (ref <18)
Troponin I (High Sensitivity): 3 ng/L (ref <18)

## 2021-05-29 LAB — I-STAT BETA HCG BLOOD, ED (MC, WL, AP ONLY): I-stat hCG, quantitative: <5 m[IU]/mL (ref <5)

## 2021-05-29 LAB — CBG MONITORING, ED: Glucose-Capillary: 90 mg/dL (ref 70–99)

## 2021-05-29 IMAGING — CR DG CHEST 2V
2 series · 2 of 2 positions shown · non-contrast
Comparison: Chest XR, [DATE] and [DATE]. CT chest,
[DATE].

CLINICAL DATA: Chest pain

EXAM:
CHEST - 2 VIEW

[chest pa]
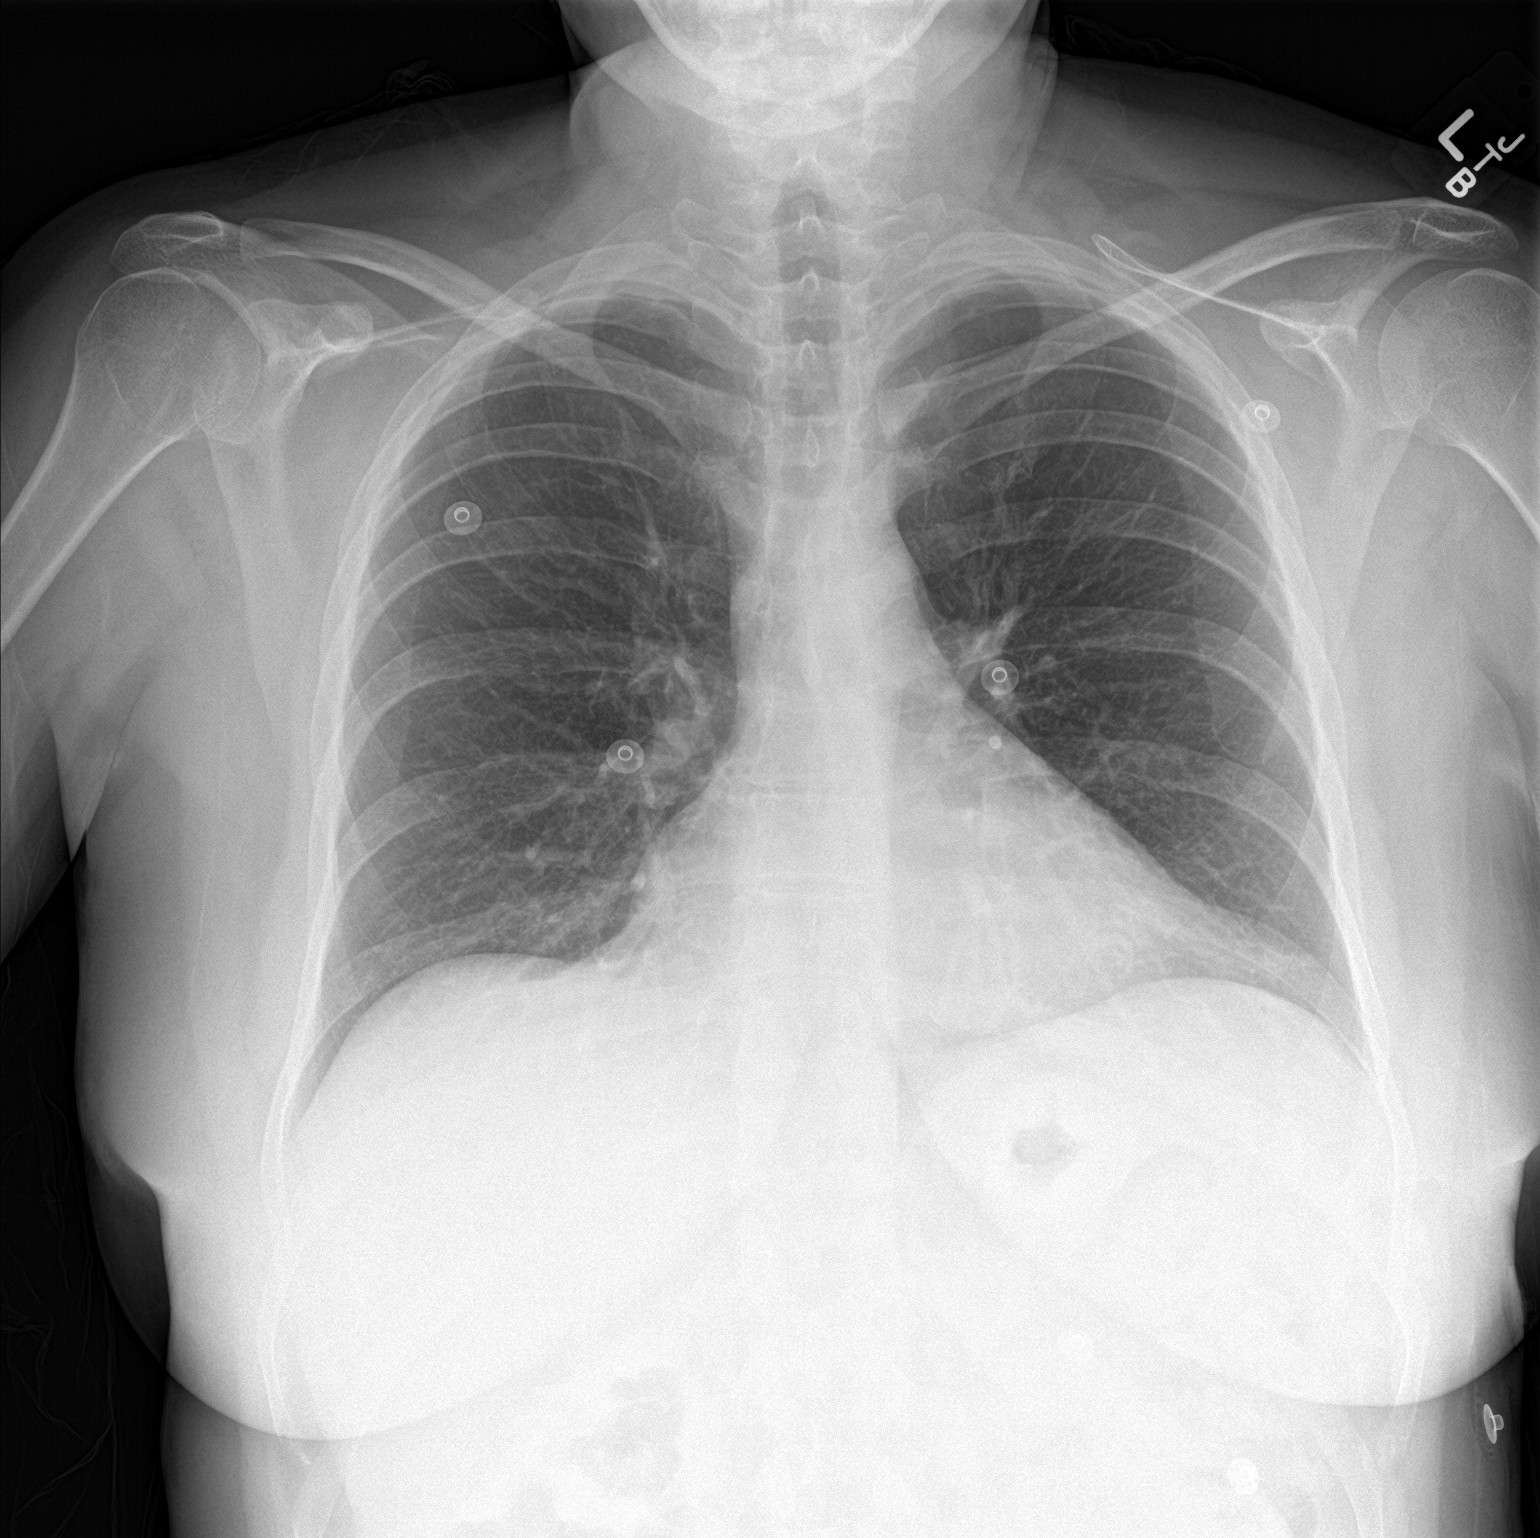

[chest lat]
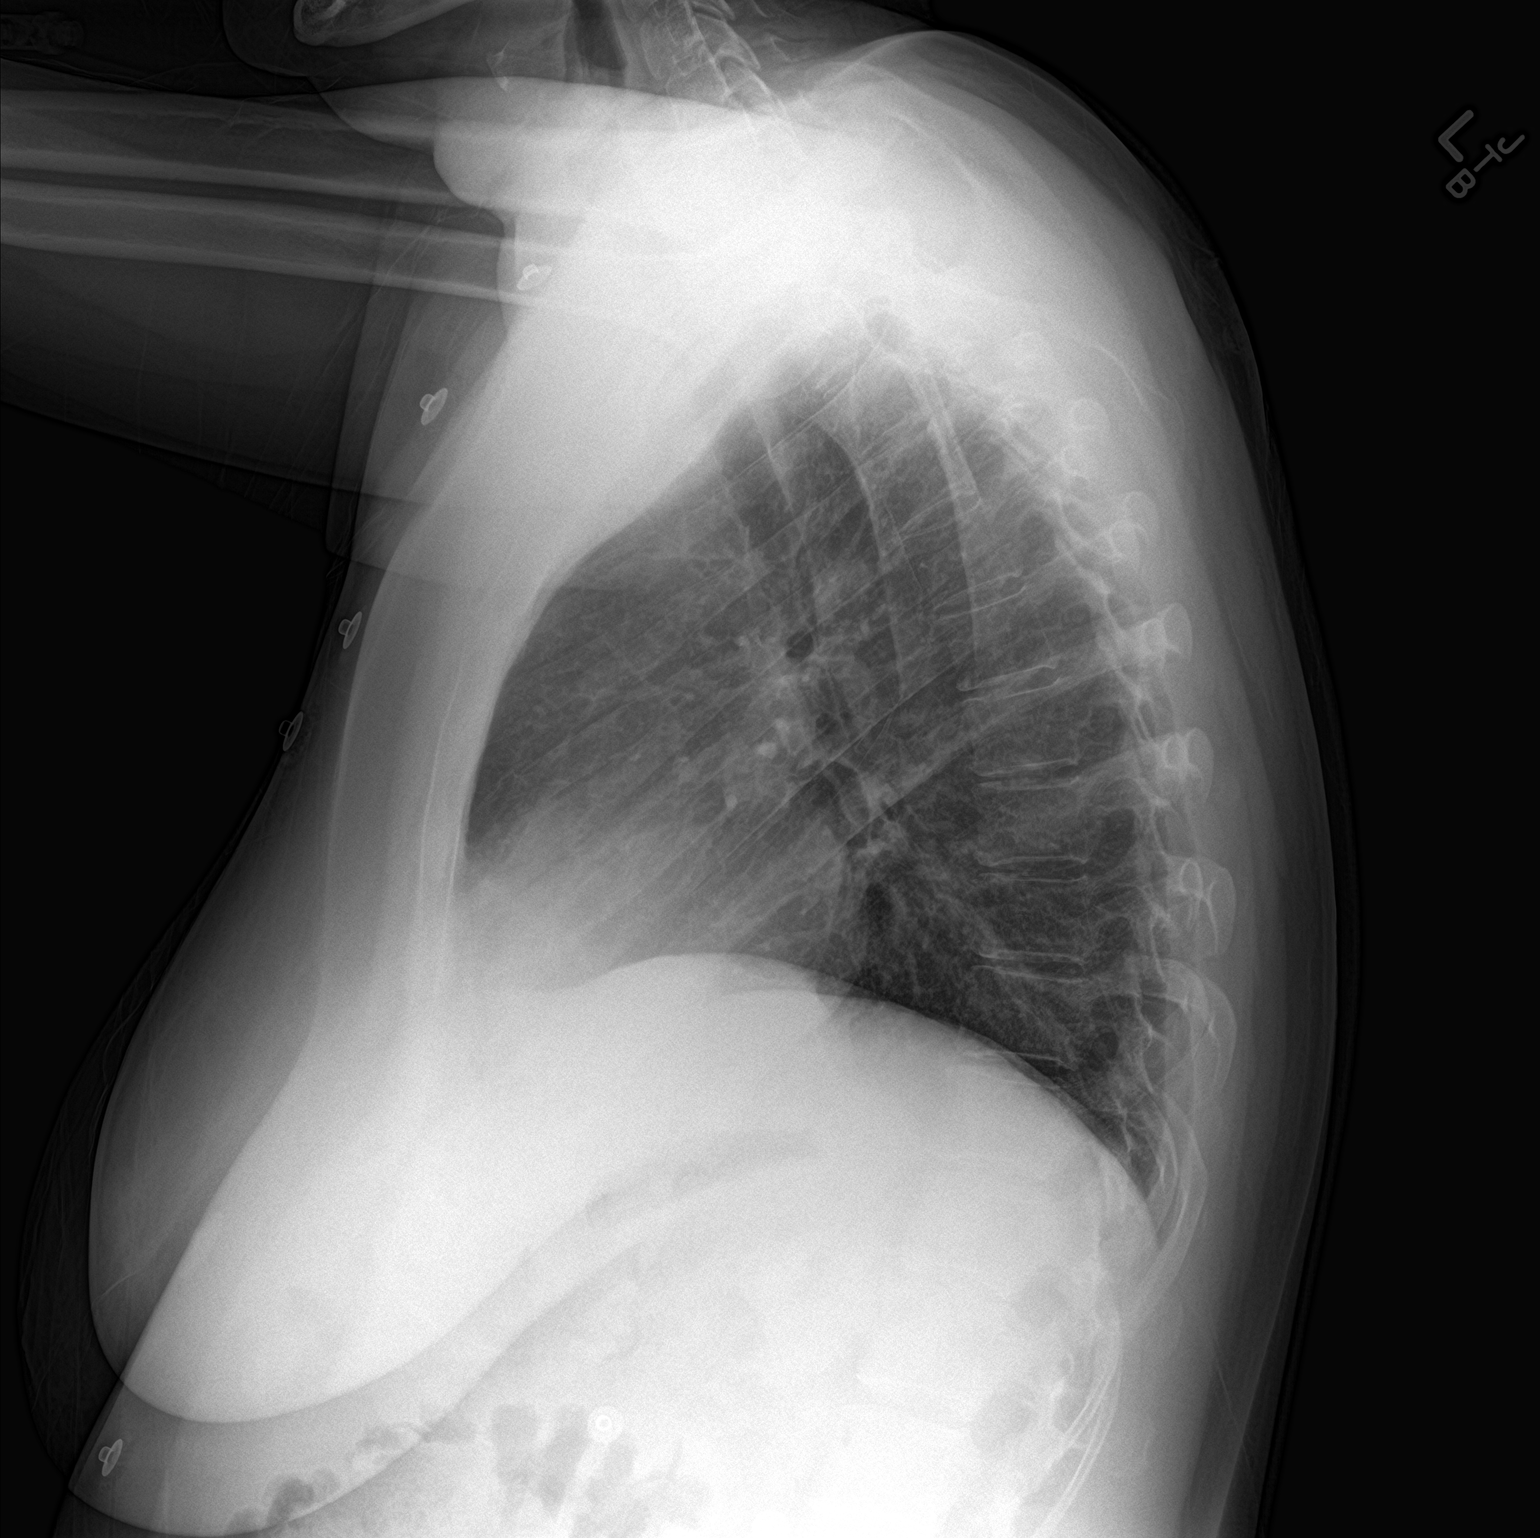

[2 of 2 positions shown; findings below may reference images not displayed]

FINDINGS: Cardiomediastinal silhouette is within normal limits. Lungs are well
inflated. No focal consolidation or mass. No pleural effusion or
pneumothorax. No acute displaced fracture.
IMPRESSION: Normal chest

## 2021-05-29 MED ORDER — KETOROLAC TROMETHAMINE 15 MG/ML IJ SOLN
15.0000 mg | Freq: Once | INTRAMUSCULAR | Status: AC
  Administered 2021-05-29: 15 mg via INTRAMUSCULAR
  Filled 2021-05-29: qty 1

## 2021-05-29 NOTE — Telephone Encounter (Signed)
Pt called wanted to inform Dr. [name] that she has had 2 seizures since she seen her last.

## 2021-05-29 NOTE — Discharge Instructions (Signed)
Your markers for heart damage were both negative.  That makes this very unlikely that you are having a heart attack.  I think most likely her symptoms are muscular if you are having these events where you spasm forcefully.  Please follow-up with your family doctor in the office.  Please return for exertional symptoms worsening difficulty breathing or persistent chest discomfort.  Take 4 over the counter ibuprofen tablets 3 times a day or 2 over-the-counter naproxen tablets twice a day for pain. Also take tylenol 1000mg (2 extra strength) four times a day.

## 2021-05-29 NOTE — ED Provider Notes (Signed)
[facility] EMERGENCY DEPARTMENT Provider Note   CSN: [id number] Arrival date & time: 05/29/21  0810     History  Chief Complaint  Patient presents with   Seizures    Julie Bennett is a 46 y.o. female.  46 yo F with a chief complaints of chest discomfort.  This is occurring when she is having seizure-like episodes.  She has been seen by neurology for this.  Has been having these symptoms off and on.  Headaches nausea and then ends up having some jaw spasm and diffuse body contortion while crying and does not lose consciousness.  She has been seen by the neurologist for this.  Had negative MRI of the brain and plan for upcoming EEG.  Currently is being treated for the possibility of migrainous cause.  She has been taking his medications as prescribed but does not feel like its been helping her.  Has developed this chest discomfort over the past few episodes of this is occurred and she feels like it is very severe when she has the events and then tends to get progressively better.  Sometimes will get worse without the seizure type activity.  She denies trauma to the area denies cough congestion or fever.  Usually starts on the right side and then migrates diffusely across the chest.  Sharp and stabbing and then feels heavy at times.  The history is provided by the patient.  Seizures Illness Severity:  Moderate Onset quality:  Gradual Duration:  2 weeks Timing:  Intermittent Progression:  Waxing and waning Chronicity:  New Associated symptoms: chest pain, headaches and nausea   Associated symptoms: no congestion, no fever, no myalgias, no rhinorrhea, no shortness of breath, no vomiting and no wheezing       Home Medications Prior to Admission medications   Medication Sig Start Date End Date Taking? Authorizing Provider  escitalopram (LEXAPRO) 10 MG tablet Take 10 mg by mouth daily.   Yes [provider]  estradiol (ESTRACE) 2 MG tablet Take 2 mg by  mouth daily. 07/28/20  Yes [provider]  medroxyPROGESTERone (PROVERA) 5 MG tablet Take 5 mg by mouth daily. 03/11/19  Yes [provider]  meloxicam (MOBIC) 7.5 MG tablet Take 1 tablet (7.5 mg total) by mouth daily. 05/28/21 06/27/21 Yes Sherilyn Cooter, MD  esomeprazole (NEXIUM) 40 MG capsule Take 40 mg by mouth daily. 05/28/21   [provider]  famotidine (PEPCID) 10 MG tablet Take 1 tablet (10 mg total) by mouth at bedtime. 09/29/20   Sanjuan Dame, MD  LORazepam (ATIVAN) 1 MG tablet Take 1 tablet (1 mg total) by mouth 3 (three) times daily as needed for anxiety. 04/14/21   Drenda Freeze, MD  rizatriptan (MAXALT-MLT) 10 MG disintegrating tablet Take 1 tablet (10 mg total) by mouth as needed for migraine. May repeat in 2 hours if needed 05/22/21   Marcial Pacas, MD  Vitamin D, Ergocalciferol, (DRISDOL) 1.25 MG (50000 UNIT) CAPS capsule Take 1 capsule by mouth once a week. 07/12/20   [provider]  zonisamide (ZONEGRAN) 100 MG capsule Take 1 capsule (100 mg total) by mouth daily. 05/22/21   Marcial Pacas, MD      Allergies    Patient has no known allergies.    Review of Systems   Review of Systems  Constitutional:  Negative for chills and fever.  HENT:  Negative for congestion and rhinorrhea.   Eyes:  Negative for redness and visual disturbance.  Respiratory:  Negative  for shortness of breath and wheezing.   Cardiovascular:  Positive for chest pain. Negative for palpitations.  Gastrointestinal:  Positive for nausea. Negative for vomiting.  Genitourinary:  Negative for dysuria and urgency.  Musculoskeletal:  Negative for arthralgias and myalgias.  Skin:  Negative for pallor and wound.  Neurological:  Positive for seizures, numbness and headaches. Negative for dizziness.   Physical Exam Updated Vital Signs BP 125/79 (BP Location: Right Arm)    Pulse 72    Temp 98.3 F (36.8 C) (Oral)    Resp 20    LMP 08/14/2013 Comment: signed preg waiver 6.26.2020    SpO2 99%  Physical Exam Vitals and nursing note reviewed.  Constitutional:      General: She is not in acute distress.    Appearance: She is well-developed. She is not diaphoretic.  HENT:     Head: Normocephalic and atraumatic.  Eyes:     Pupils: Pupils are equal, round, and reactive to light.  Cardiovascular:     Rate and Rhythm: Normal rate and regular rhythm.     Heart sounds: No murmur heard.   No friction rub. No gallop.  Pulmonary:     Effort: Pulmonary effort is normal.     Breath sounds: No wheezing or rales.  Abdominal:     General: There is no distension.     Palpations: Abdomen is soft.     Tenderness: There is no abdominal tenderness.  Musculoskeletal:        General: Tenderness present.     Cervical back: Normal range of motion and neck supple.     Comments: Mild tenderness to the right chest wall.  Skin:    General: Skin is warm and dry.  Neurological:     Mental Status: She is alert and oriented to person, place, and time.  Psychiatric:        Behavior: Behavior normal.    ED Results / Procedures / Treatments   Labs (all labs ordered are listed, but only abnormal results are displayed) Labs Reviewed  BASIC METABOLIC PANEL - Abnormal; Notable for the following components:      Result Value   Creatinine, Ser 1.15 (*)    GFR, Estimated 60 (*)    All other components within normal limits  CBC  CBG MONITORING, ED  I-STAT BETA HCG BLOOD, ED ([facility], [facility], [facility] ONLY)  TROPONIN I (HIGH SENSITIVITY)  TROPONIN I (HIGH SENSITIVITY)    EKG None  Radiology DG Chest 2 View  Result Date: 05/29/2021 CLINICAL DATA:  Chest pain EXAM: CHEST - 2 VIEW COMPARISON:  Chest XR, 04/13/2021 and 09/27/2020. CT chest, 05/17/2018. FINDINGS: Cardiomediastinal silhouette is within normal limits. Lungs are well inflated. No focal consolidation or mass. No pleural effusion or pneumothorax. No acute displaced fracture. IMPRESSION: Normal chest Electronically Signed   By: Michaelle Birks M.D.    On: 05/29/2021 08:47    Procedures Procedures    Medications Ordered in ED Medications  ketorolac (TORADOL) 15 MG/ML injection 15 mg (15 mg Intramuscular Given 05/29/21 1641)    ED Course/ Medical Decision Making/ A&P                           Medical Decision Making  46 yo F with a chief complaints of chest discomfort with shaking activity that she has been having over the past few months.  She is concerned because the chest pain does not typically happen with this.  She has  been seeing a neurologist for these events to try to be worked up for possible seizure-like activity.  Has had a negative MRI.  I suspect this is most likely musculoskeletal based on history of the patient has these recurrent events where she tenses up all over than she likely has strained her chest wall.  Chest x-ray viewed by me without focal infiltrate or pneumothorax.  EKG without concerning finding.  Troponin negative.  No significant anemia no significant electrolyte abnormality.  She did have recurrence of her pain while she was waiting in the waiting room.  We will obtain a second troponin.  I feel this is completely atypical of a PE and no further work-up is needed.  On record review the patient has seen the cardiology group in the office.  Saw Dr. Johnsie Cancel 2 years ago at that time thought to be unlikely to be cardiac in origin.  I reviewed Dr. Rhea Belton note, neurology.  She is not convinced based on her symptoms that these are seizures.  Still awaiting EEG.   The patient second troponin is resulted and is negative.  We will treat as musculoskeletal.  PCP follow-up.  5:21 PM:  I have discussed the diagnosis/risks/treatment options with the patient and believe the pt to be eligible for discharge home to follow-up with PCP, neuro. We also discussed returning to the ED immediately if new or worsening sx occur. We discussed the sx which are most concerning (e.g., sudden worsening pain, fever, inability to tolerate by  mouth, exertional symptoms) that necessitate immediate return. Medications administered to the patient during their visit and any new prescriptions provided to the patient are listed below.  Medications given during this visit Medications  ketorolac (TORADOL) 15 MG/ML injection 15 mg (15 mg Intramuscular Given 05/29/21 1641)     The patient appears reasonably screen and/or stabilized for discharge and I doubt any other medical condition or other [facility] requiring further screening, evaluation, or treatment in the ED at this time prior to discharge.          Final Clinical Impression(s) / ED Diagnoses Final diagnoses:  Nonspecific chest pain    Rx / DC Orders ED Discharge Orders     None         Deno Etienne, DO 05/29/21 1721

## 2021-05-29 NOTE — Telephone Encounter (Signed)
I called the patient back. Reports having a seizure last night at home. Her husband told her he noted sluggish speech followed by body tremors. This lasted about one minute. States she also had another seizure this morning at work. Similar event. Sluggish speech w/ body tremors for one minute. Says the event was followed by "excruciating" chest pain. EMS was called and she was transported to the hospital. She is currently in the ED waiting for her work-up. She has not underwent the ordered EEG yet (appt was pending 06/04/21). She is taking zonisamide 100mg , one tablet daily (more for her migraines). However, denies any missed doses. She will call our office after discharge and schedule her follow up as instructed by the hospital. She is aware we will send this to Dr. for her review.  This information was documented in a mychart message and sent to Dr. [name].

## 2021-05-29 NOTE — ED Notes (Signed)
Pt verbalized understanding of d/c instructions, meds and followup care. Denies questions. VSS, no distress noted. W/C to exit with all belongings.  

## 2021-05-29 NOTE — Telephone Encounter (Signed)
I called the patient back. Reports having a seizure last night at home. Her husband told her he noted sluggish speech followed by body tremors. This lasted about one minute. States she also had another seizure this morning at work. Similar event. Sluggish speech w/ body tremors for one minute. Says the event was followed by "excruciating" chest pain. EMS was called and she was transported to the hospital. She is currently in the ED waiting for her work-up. She has not underwent the ordered EEG yet (appt was pending 06/04/21). She is taking zonisamide 100mg , one tablet daily (more for her migraines). However, denies any missed doses. She will call our office after discharge and schedule her follow up as instructed by the hospital. She is aware we will send this to Dr. for her review.

## 2021-05-29 NOTE — ED Triage Notes (Addendum)
Patient BIB [facility] from work with complaint of a witnessed seizure. Patient recently diagnosed with seizure disorder in November 2022 and prescribed zonisamide. Patient reports right sided chest pain, right sided numbness, and stuttered speech that she states is normal for her after she has a seizure but does report that chest pain is more severe today than usual. 20g saline lock in left AC. Patient has no missed any doses of her seizure medication and is alert, oriented, and in no apparent distress at this time.   BP 140/80 CBG 92 98% on room air

## 2021-06-04 ENCOUNTER — Ambulatory Visit (INDEPENDENT_AMBULATORY_CARE_PROVIDER_SITE_OTHER): Payer: BC Managed Care – PPO | Admitting: Neurology

## 2021-06-04 DIAGNOSIS — R569 Unspecified convulsions: Secondary | ICD-10-CM

## 2021-06-12 ENCOUNTER — Ambulatory Visit: Payer: BC Managed Care – PPO | Admitting: Cardiology

## 2021-06-12 ENCOUNTER — Other Ambulatory Visit: Payer: Self-pay

## 2021-06-12 ENCOUNTER — Inpatient Hospital Stay: Payer: BC Managed Care – PPO

## 2021-06-12 ENCOUNTER — Encounter: Payer: Self-pay | Admitting: Cardiology

## 2021-06-12 VITALS — Temp 98.0°F | Ht 65.0 in | Wt 170.0 lb

## 2021-06-12 DIAGNOSIS — R072 Precordial pain: Secondary | ICD-10-CM

## 2021-06-12 DIAGNOSIS — R002 Palpitations: Secondary | ICD-10-CM

## 2021-06-12 DIAGNOSIS — R29818 Other symptoms and signs involving the nervous system: Secondary | ICD-10-CM | POA: Insufficient documentation

## 2021-06-12 DIAGNOSIS — Z8249 Family history of ischemic heart disease and other diseases of the circulatory system: Secondary | ICD-10-CM | POA: Insufficient documentation

## 2021-06-12 MED ORDER — METOPROLOL TARTRATE 25 MG PO TABS: 25.0000 mg | ORAL_TABLET | ORAL | 1 refills | Status: DC

## 2021-06-12 MED ORDER — NITROGLYCERIN 0.4 MG SL SUBL: 0.4000 mg | SUBLINGUAL_TABLET | SUBLINGUAL | 1 refills | Status: DC | PRN

## 2021-06-12 NOTE — Patient Instructions (Signed)
Your cardiac CT will be scheduled at the locations below:   [facility]  [address]  [place], [place] [address]  [phone]    If scheduled at [facility], please arrive at the [facility] main entrance of [facility] 30-45 minutes prior to test start time.  Proceed to the [facility] Radiology Department (first floor) to check-in and test prep.   Please follow these instructions carefully (unless otherwise directed):    Hold all erectile dysfunction medications at least 3 days (72 hrs) prior to test.   On the Night Before the Test:   Be sure to Drink plenty of water.   Do not consume any caffeinated/decaffeinated beverages or chocolate 12 hours prior to your test.   Do not take any antihistamines 12 hours prior to your test.   If the patient has contrast allergy:  Patient will need a prescription for Prednisone and very clear instructions (as follows):  1. Prednisone 50 mg - take 13 hours prior to test  2. Take another Prednisone 50 mg 7 hours prior to test  3. Take another Prednisone 50 mg 1 hour prior to test  4. Take Benadryl 50 mg 1 hour prior to test   Patient must complete all four doses of above prophylactic medications.   Patient will need a ride after test due to Benadryl.   On the Day of the Test:   Drink plenty of water. Do not drink any water within one hour of the test.   Do not eat any food 4 hours prior to the test.   You may take your regular medications prior to the test.    - Metoprolol tartarate 50 mg 2 hours before CT scan You may stop it after the CT scan, unless specified otherwise by me.    FEMALES- please wear underwire-free bra if available          After the Test:   Drink plenty of water.   After receiving IV contrast, you may experience a mild flushed feeling. This is normal.   On occasion, you may experience a mild rash up to 24 hours after the test. This is not dangerous. If this occurs, you  can take Benadryl 25 mg and increase your fluid intake.   If you experience trouble breathing, this can be serious. If it is severe call 911 IMMEDIATELY. If it is mild, please call our office.   If you take any of these medications: Glipizide/Metformin, Avandament, Glucavance, please do not take 48 hours after completing test unless otherwise instructed.     Please contact the cardiac imaging nurse navigator should you have any questions/concerns  Rockwell Alexandria, RN Navigator Cardiac Imaging  [facility]  [phone] Office  [phone] Cell

## 2021-06-12 NOTE — Progress Notes (Signed)
Patient referred by Bartholome Bill, MD for chest pain  Subjective:   Julie Bennett, female    DOB: [birth year], 46 y.o.   MRN: [id number]   Chief Complaint  Patient presents with   transient neuro symptoms   New Patient (Initial Visit)     HPI  46 y.o. Caucasian female with recurrent episodes of seizure-like activity, now with chest pain  Patient is an Automotive engineer.  She has been having episodes of sudden tensing up and seizure-like activity without complete loss of consciousness.  She has been seen by endocrinologist.  EEG has been negative.  She has been treated for migraine headaches.  Patient reports that she has sharp chest pain after her shaking episode the last for several minutes, improves after resting.  She also reports episodes of palpitations associated with her above episodes.  She does have family stiff early CAD with her brother having had coronary stents in his 45s.   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     esomeprazole (NEXIUM) 40 MG capsule Take 40 mg by mouth daily.     estradiol (ESTRACE) 2 MG tablet Take 2 mg by mouth daily.     famotidine (PEPCID) 10 MG tablet Take 1 tablet (10 mg total) by mouth at bedtime. 30 tablet 0   LORazepam (ATIVAN) 1 MG tablet Take 1 tablet  (1 mg total) by mouth 3 (three) times daily as needed for anxiety. 10 tablet 0   medroxyPROGESTERone (PROVERA) 5 MG tablet Take 5 mg by mouth daily.     meloxicam (MOBIC) 7.5 MG tablet Take 1 tablet (7.5 mg total) by mouth daily. 30 tablet 0   naproxen sodium (ALEVE) 220 MG tablet Take 220 mg by mouth as needed (headache).     rizatriptan (MAXALT-MLT) 10 MG disintegrating tablet Take 1 tablet (10 mg total) by mouth as needed for migraine. May repeat in 2 hours if needed 12 tablet 6   Vitamin D, Ergocalciferol, (DRISDOL) 1.25 MG (50000 UNIT) CAPS capsule Take 1 capsule by mouth once a week. Take on Saturdays     zonisamide (ZONEGRAN) 100 MG capsule Take 1 capsule (100 mg total) by mouth daily. 30 capsule 11   No current facility-administered medications on file prior to visit.    Cardiovascular and other pertinent studies:  EKG 06/12/2021: Sinus rhythm 75 bpm Normal EKG  MRI brain 04/29/2022: Normal examination. No abnormality seen to explain the clinical  presentation.    Recent labs: 05/29/2021: Glucose 91, BUN/Cr 13/1.15. EGFR 60. Na/K 137/3.9. Rest of the BMP normal H/H 13.1/39.2. MCV 90.3. Platelets 276  04/19/2021:  2.68  TSH normal  07/11/2019:  Chol 195, TG 160, HDL 48, LDL 115     Review of Systems  Cardiovascular:  Positive for chest pain. Negative for dyspnea on exertion, leg swelling, palpitations and syncope.  Neurological:        Seizure-like activity        Vitals:   06/12/21 0918 06/12/21 0919  Temp:    SpO2: 100% 100%     Body mass index is 28.29 kg/m. Filed Weights   06/12/21 0909  Weight: 170 lb (77.1 kg)     Objective:   Physical Exam Vitals and nursing note reviewed.  Constitutional:      General: She is not in acute distress. Neck:     Vascular: No JVD.  Cardiovascular:     Rate and Rhythm: Normal rate and regular rhythm.     Heart sounds: Normal heart sounds. No murmur heard. Pulmonary:     Effort: Pulmonary effort is normal.      Breath sounds: Normal breath sounds. No wheezing or rales.  Musculoskeletal:     Right lower leg: No edema.     Left lower leg: No edema.        Assessment & Recommendations:   46 y.o. Caucasian female with recurrent episodes of seizure-like activity, now with chest pain  Chest pain: Atypical pain following seizure-like activity, without EEG diagnosis of seizures. Family history of early CAD with brother having had coronary stents in 86s.  Mildly elevated LDL. Given recurrent episodes and risk factor, I will obtain coronary CT angiogram with calcium score for definitive diagnosis.  Palpitations: Recommend 2-week cardiac telemetry  Further recommendations after above testing  Thank you for referring the patient to [place]. Please feel free to contact with any questions.   Nigel Mormon, MD Pager: [phone] Office: [phone]

## 2021-06-13 ENCOUNTER — Ambulatory Visit: Payer: Medicaid Other | Admitting: Cardiology

## 2021-06-13 ENCOUNTER — Other Ambulatory Visit: Payer: Self-pay | Admitting: Cardiology

## 2021-06-13 DIAGNOSIS — R072 Precordial pain: Secondary | ICD-10-CM

## 2021-06-13 DIAGNOSIS — Z8249 Family history of ischemic heart disease and other diseases of the circulatory system: Secondary | ICD-10-CM

## 2021-06-14 ENCOUNTER — Other Ambulatory Visit: Payer: Self-pay

## 2021-06-14 ENCOUNTER — Encounter: Payer: Self-pay | Admitting: Physical Medicine and Rehabilitation

## 2021-06-14 ENCOUNTER — Ambulatory Visit (INDEPENDENT_AMBULATORY_CARE_PROVIDER_SITE_OTHER): Payer: BC Managed Care – PPO | Admitting: Physical Medicine and Rehabilitation

## 2021-06-14 DIAGNOSIS — R202 Paresthesia of skin: Secondary | ICD-10-CM | POA: Diagnosis not present

## 2021-06-14 NOTE — Progress Notes (Signed)
Pt state both hands cramps and numbness that travels up her arms. Pt state her face feels numb. Pt state it hard for her to hold thing and her hands fall a sleep. Pt state she shakes her hands to wake them up. Pt states she right handed. Pt has heart monitor.  Numeric Pain Rating Scale and Functional Assessment Average Pain 4   In the last MONTH (on 0-10 scale) has pain interfered with the following?  1. General activity like being  able to carry out your everyday physical activities such as walking, climbing stairs, carrying groceries, or moving a chair?  Rating(10)

## 2021-06-14 NOTE — Progress Notes (Signed)
Julie Ginsreasa L Lappe - 46 y.o. female MRN [id number]  Date of birth: 06-Mar-1976  Office Visit Note: Visit Date: 06/14/2021 PCP: Verlon AuBoyd, Tammy Lamonica, MD Referred by: Marlyne BeardsBenfield, Charlie, MD  Subjective: Chief Complaint  Patient presents with   Right Arm - Pain   Left Arm - Pain   Right Hand - Pain, Numbness   Left Hand - Pain, Numbness   HPI:  Julie Bennett is a 46 y.o. female who comes in today at the request of Dr. Waylan Rocherharles Benfield for electrodiagnostic study of the Bilateral upper extremities.  Patient is Right hand dominant.  She reports chronic worsening severe nondermatomal numbness in both hands fairly equally somewhat right more than left.  She gets cramping in the hands as well.  It does travel up the arms.  She denies any frank radicular type symptoms.  She reports that she gets numbness in the hands and will shake them to wake him up.  She does get symptoms while holding objects or doing activities.  Does not endorse specific nocturnal complaints.  Her neurologic history is quite complicated that she is suffering from migraine headaches as well as some type of seizure-like disorder with normal EEG although that report I cannot find the patient states that it was normal.  She [facility].  She did tell me that she had a seizure-like activity in the car in the parking lot.  She does not appear to have any postictal symptoms at this point.  She says that her face can feel numb at times if she does get some slurring of speech.  No prior electrodiagnostic studies.  ROS Otherwise per HPI.  Assessment & Plan: Visit Diagnoses:    ICD-10-CM   1. Paresthesia of skin  R20.2 NCV with EMG (electromyography)      Plan: Impression: Essentially NORMAL electrodiagnostic study of both upper limbs.  There is no significant electrodiagnostic evidence of nerve entrapment, brachial plexopathy or cervical radiculopathy.    As you know, purely sensory or demyelinating  radiculopathies and chemical radiculitis may not be detected with this particular electrodiagnostic study. **This electrodiagnostic study cannot rule out small fiber polyneuropathy and dysesthesias from central pain syndromes such as stroke or central pain sensitization syndromes such as fibromyalgia.  Myotomal referral pain from trigger points is also not excluded.  Recommendations: 1.  Follow-up with referring physician. 2.  Continue current management of symptoms.  Meds & Orders: No orders of the defined types were placed in this encounter.   Orders Placed This Encounter  Procedures   NCV with EMG (electromyography)    Follow-up: Return in about 2 weeks (around 06/28/2021) for Waylan Rocherharles Benfield, MD.   Procedures: No procedures performed  EMG & NCV Findings: All nerve conduction studies (as indicated in the following tables) were within normal limits.  All left vs. right side differences were within normal limits.    All examined muscles (as indicated in the following table) showed no evidence of electrical instability.    Impression: Essentially NORMAL electrodiagnostic study of both upper limbs.  There is no significant electrodiagnostic evidence of nerve entrapment, brachial plexopathy or cervical radiculopathy.    As you know, purely sensory or demyelinating radiculopathies and chemical radiculitis may not be detected with this particular electrodiagnostic study. **This electrodiagnostic study cannot rule out small fiber polyneuropathy and dysesthesias from central pain syndromes such as stroke or central pain sensitization syndromes such as fibromyalgia.  Myotomal referral pain from trigger points is also not excluded.  Recommendations: 1.  Follow-up with referring physician. 2.  Continue current management of symptoms.  ___________________________ Naaman Plummer FAAPMR Board Certified, American Board of Physical Medicine and Rehabilitation    Nerve Conduction Studies Anti  Sensory Summary Table   Stim Site NR Peak (ms) Norm Peak (ms) P-T Amp (V) Norm P-T Amp Site1 Site2 Delta-P (ms) Dist (cm) Vel (m/s) Norm Vel (m/s)  Left Median Acr Palm Anti Sensory (2nd Digit)  33C  Wrist    2.9 <3.6 35.8 >10 Wrist Palm 1.2 0.0    Palm    1.7 <2.0 36.7         Right Median Acr Palm Anti Sensory (2nd Digit)  32.5C  Wrist    3.0 <3.6 40.1 >10 Wrist Palm 1.3 0.0    Palm    1.7 <2.0 41.1         Left Radial Anti Sensory (Base 1st Digit)  32.5C  Wrist    2.1 <3.1 38.8  Wrist Base 1st Digit 2.1 0.0    Right Radial Anti Sensory (Base 1st Digit)  32.3C  Wrist    2.0 <3.1 33.4  Wrist Base 1st Digit 2.0 0.0    Left Ulnar Anti Sensory (5th Digit)  33C  Wrist    3.1 <3.7 39.3 >15.0 Wrist 5th Digit 3.1 14.0 45 >38  Right Ulnar Anti Sensory (5th Digit)  32.6C  Wrist    3.0 <3.7 32.2 >15.0 Wrist 5th Digit 3.0 14.0 47 >38   Motor Summary Table   Stim Site NR Onset (ms) Norm Onset (ms) O-P Amp (mV) Norm O-P Amp Site1 Site2 Delta-0 (ms) Dist (cm) Vel (m/s) Norm Vel (m/s)  Left Median Motor (Abd Poll Brev)  32.9C  Wrist    3.0 <4.2 5.4 >5 Elbow Wrist 3.8 20.0 53 >50  Elbow    6.8  5.4         Right Median Motor (Abd Poll Brev)  32.4C  Wrist    3.1 <4.2 5.8 >5 Elbow Wrist 3.9 20.0 51 >50  Elbow    7.0  5.7         Left Ulnar Motor (Abd Dig Min)  32.9C  Wrist    2.9 <4.2 5.5 >3 B Elbow Wrist 2.8 18.5 66 >53  B Elbow    5.7  5.5  A Elbow B Elbow 1.2 10.0 83 >53  A Elbow    6.9  5.4         Right Ulnar Motor (Abd Dig Min)  32.5C  Wrist    2.8 <4.2 6.7 >3 B Elbow Wrist 3.0 20.0 67 >53  B Elbow    5.8  6.5  A Elbow B Elbow 1.2 10.0 83 >53  A Elbow    7.0  6.4          EMG   Side Muscle Nerve Root Ins Act Fibs Psw Amp Dur Poly Recrt Int Dennie Bible Comment  Right Abd Poll Brev Median C8-T1 Nml Nml Nml Nml Nml 0 Nml Nml   Right 1stDorInt Ulnar C8-T1 Nml Nml Nml Nml Nml 0 Nml Nml   Right PronatorTeres Median C6-7 Nml Nml Nml Nml Nml 0 Nml Nml   Right Biceps Musculocut C5-6 Nml Nml  Nml Nml Nml 0 Nml Nml   Right Deltoid Axillary C5-6 Nml Nml Nml Nml Nml 0 Nml Nml     Nerve Conduction Studies Anti Sensory Left/Right Comparison   Stim Site L Lat (ms) R Lat (ms) L-R Lat (ms) L Amp (V) R  Amp (V) L-R Amp (%) Site1 Site2 L Vel (m/s) R Vel (m/s) L-R Vel (m/s)  Median Acr Palm Anti Sensory (2nd Digit)  33C  Wrist 2.9 3.0 0.1 35.8 40.1 10.7 Wrist Palm     Palm 1.7 1.7 0.0 36.7 41.1 10.7       Radial Anti Sensory (Base 1st Digit)  32.5C  Wrist 2.1 2.0 0.1 38.8 33.4 13.9 Wrist Base 1st Digit     Ulnar Anti Sensory (5th Digit)  33C  Wrist 3.1 3.0 0.1 39.3 32.2 18.1 Wrist 5th Digit 45 47 2   Motor Left/Right Comparison   Stim Site L Lat (ms) R Lat (ms) L-R Lat (ms) L Amp (mV) R Amp (mV) L-R Amp (%) Site1 Site2 L Vel (m/s) R Vel (m/s) L-R Vel (m/s)  Median Motor (Abd Poll Brev)  32.9C  Wrist 3.0 3.1 0.1 5.4 5.8 6.9 Elbow Wrist 53 51 2  Elbow 6.8 7.0 0.2 5.4 5.7 5.3       Ulnar Motor (Abd Dig Min)  32.9C  Wrist 2.9 2.8 0.1 5.5 6.7 17.9 B Elbow Wrist 66 67 1  B Elbow 5.7 5.8 0.1 5.5 6.5 15.4 A Elbow B Elbow 83 83 0  A Elbow 6.9 7.0 0.1 5.4 6.4 15.6          Waveforms:                      Clinical History: No specialty comments available.     Objective:  VS:  HT:     WT:    BMI:      BP:    HR: bpm   TEMP: ( )   RESP:  Physical Exam Musculoskeletal:        General: No swelling, tenderness or deformity.     Comments: Inspection reveals no atrophy of the bilateral APB or FDI or hand intrinsics. There is no swelling, color changes, allodynia or dystrophic changes. There is 5 out of 5 strength in the bilateral wrist extension, finger abduction and long finger flexion. There is intact sensation to light touch in all dermatomal and peripheral nerve distributions. There is a negative Froment's test bilaterally. There is a negative Tinel's test at the bilateral wrist and elbow. There is a negative Phalen's test bilaterally. There is a negative Hoffmann's test  bilaterally.  Skin:    General: Skin is warm and dry.     Findings: No erythema or rash.  Neurological:     General: No focal deficit present.     Mental Status: She is alert and oriented to person, place, and time.     Motor: No weakness or abnormal muscle tone.     Coordination: Coordination normal.  Psychiatric:        Mood and Affect: Mood normal.        Behavior: Behavior normal.     Imaging: No results found.

## 2021-06-17 NOTE — Procedures (Signed)
EMG & NCV Findings: All nerve conduction studies (as indicated in the following tables) were within normal limits.  All left vs. right side differences were within normal limits.    All examined muscles (as indicated in the following table) showed no evidence of electrical instability.    Impression: Essentially NORMAL electrodiagnostic study of both upper limbs.  There is no significant electrodiagnostic evidence of nerve entrapment, brachial plexopathy or cervical radiculopathy.    As you know, purely sensory or demyelinating radiculopathies and chemical radiculitis may not be detected with this particular electrodiagnostic study. **This electrodiagnostic study cannot rule out small fiber polyneuropathy and dysesthesias from central pain syndromes such as stroke or central pain sensitization syndromes such as fibromyalgia.  Myotomal referral pain from trigger points is also not excluded.  Recommendations: 1.  Follow-up with referring physician. 2.  Continue current management of symptoms.  ___________________________ Naaman Plummer FAAPMR Board Certified, American Board of Physical Medicine and Rehabilitation    Nerve Conduction Studies Anti Sensory Summary Table   Stim Site NR Peak (ms) Norm Peak (ms) P-T Amp (V) Norm P-T Amp Site1 Site2 Delta-P (ms) Dist (cm) Vel (m/s) Norm Vel (m/s)  Left Median Acr Palm Anti Sensory (2nd Digit)  33C  Wrist    2.9 <3.6 35.8 >10 Wrist Palm 1.2 0.0    Palm    1.7 <2.0 36.7         Right Median Acr Palm Anti Sensory (2nd Digit)  32.5C  Wrist    3.0 <3.6 40.1 >10 Wrist Palm 1.3 0.0    Palm    1.7 <2.0 41.1         Left Radial Anti Sensory (Base 1st Digit)  32.5C  Wrist    2.1 <3.1 38.8  Wrist Base 1st Digit 2.1 0.0    Right Radial Anti Sensory (Base 1st Digit)  32.3C  Wrist    2.0 <3.1 33.4  Wrist Base 1st Digit 2.0 0.0    Left Ulnar Anti Sensory (5th Digit)  33C  Wrist    3.1 <3.7 39.3 >15.0 Wrist 5th Digit 3.1 14.0 45 >38  Right Ulnar Anti  Sensory (5th Digit)  32.6C  Wrist    3.0 <3.7 32.2 >15.0 Wrist 5th Digit 3.0 14.0 47 >38   Motor Summary Table   Stim Site NR Onset (ms) Norm Onset (ms) O-P Amp (mV) Norm O-P Amp Site1 Site2 Delta-0 (ms) Dist (cm) Vel (m/s) Norm Vel (m/s)  Left Median Motor (Abd Poll Brev)  32.9C  Wrist    3.0 <4.2 5.4 >5 Elbow Wrist 3.8 20.0 53 >50  Elbow    6.8  5.4         Right Median Motor (Abd Poll Brev)  32.4C  Wrist    3.1 <4.2 5.8 >5 Elbow Wrist 3.9 20.0 51 >50  Elbow    7.0  5.7         Left Ulnar Motor (Abd Dig Min)  32.9C  Wrist    2.9 <4.2 5.5 >3 B Elbow Wrist 2.8 18.5 66 >53  B Elbow    5.7  5.5  A Elbow B Elbow 1.2 10.0 83 >53  A Elbow    6.9  5.4         Right Ulnar Motor (Abd Dig Min)  32.5C  Wrist    2.8 <4.2 6.7 >3 B Elbow Wrist 3.0 20.0 67 >53  B Elbow    5.8  6.5  A Elbow B Elbow 1.2 10.0 83 >53  A Elbow    7.0  6.4          EMG   Side Muscle Nerve Root Ins Act Fibs Psw Amp Dur Poly Recrt Int Dennie Bible Comment  Right Abd Poll Brev Median C8-T1 Nml Nml Nml Nml Nml 0 Nml Nml   Right 1stDorInt Ulnar C8-T1 Nml Nml Nml Nml Nml 0 Nml Nml   Right PronatorTeres Median C6-7 Nml Nml Nml Nml Nml 0 Nml Nml   Right Biceps Musculocut C5-6 Nml Nml Nml Nml Nml 0 Nml Nml   Right Deltoid Axillary C5-6 Nml Nml Nml Nml Nml 0 Nml Nml     Nerve Conduction Studies Anti Sensory Left/Right Comparison   Stim Site L Lat (ms) R Lat (ms) L-R Lat (ms) L Amp (V) R Amp (V) L-R Amp (%) Site1 Site2 L Vel (m/s) R Vel (m/s) L-R Vel (m/s)  Median Acr Palm Anti Sensory (2nd Digit)  33C  Wrist 2.9 3.0 0.1 35.8 40.1 10.7 Wrist Palm     Palm 1.7 1.7 0.0 36.7 41.1 10.7       Radial Anti Sensory (Base 1st Digit)  32.5C  Wrist 2.1 2.0 0.1 38.8 33.4 13.9 Wrist Base 1st Digit     Ulnar Anti Sensory (5th Digit)  33C  Wrist 3.1 3.0 0.1 39.3 32.2 18.1 Wrist 5th Digit 45 47 2   Motor Left/Right Comparison   Stim Site L Lat (ms) R Lat (ms) L-R Lat (ms) L Amp (mV) R Amp (mV) L-R Amp (%) Site1 Site2 L Vel (m/s) R  Vel (m/s) L-R Vel (m/s)  Median Motor (Abd Poll Brev)  32.9C  Wrist 3.0 3.1 0.1 5.4 5.8 6.9 Elbow Wrist 53 51 2  Elbow 6.8 7.0 0.2 5.4 5.7 5.3       Ulnar Motor (Abd Dig Min)  32.9C  Wrist 2.9 2.8 0.1 5.5 6.7 17.9 B Elbow Wrist 66 67 1  B Elbow 5.7 5.8 0.1 5.5 6.5 15.4 A Elbow B Elbow 83 83 0  A Elbow 6.9 7.0 0.1 5.4 6.4 15.6          Waveforms:

## 2021-06-21 ENCOUNTER — Encounter: Payer: Self-pay | Admitting: Neurology

## 2021-06-24 ENCOUNTER — Encounter: Payer: Self-pay | Admitting: *Deleted

## 2021-06-24 ENCOUNTER — Telehealth: Payer: Self-pay | Admitting: Neurology

## 2021-06-24 ENCOUNTER — Ambulatory Visit: Payer: BC Managed Care – PPO | Admitting: Neurology

## 2021-06-24 DIAGNOSIS — G43709 Chronic migraine without aura, not intractable, without status migrainosus: Secondary | ICD-10-CM

## 2021-06-24 MED ORDER — LAMOTRIGINE 100 MG PO TABS: 100.0000 mg | ORAL_TABLET | Freq: Two times a day (BID) | ORAL | 11 refills | Status: AC

## 2021-06-24 MED ORDER — LAMOTRIGINE 25 MG PO TABS: 25.0000 mg | ORAL_TABLET | Freq: Every day | ORAL | 0 refills | Status: DC

## 2021-06-24 NOTE — Procedures (Signed)
° °  HISTORY: 46 year old female, with a current body shaking episode,  TECHNIQUE:  This is a routine 16 channel EEG recording with one channel devoted to a limited EKG recording.  It was performed during wakefulness, drowsiness and asleep.  Hyperventilation and photic stimulation were performed as activating procedures.  There are frequent muscle and movement artifact noted.  Upon maximum arousal, posterior dominant waking rhythm consistent of rhythmic alpha range activity, with frequency of 9 hz. Activities are symmetric over the bilateral posterior derivations and attenuated with eye opening.  Hyperventilation produced mild/moderate buildup with higher amplitude and the slower activities noted.  Photic stimulation did not alter the tracing.  During EEG recording, patient developed drowsiness and no deeper stage of sleep was achieved.  During EEG recording, there was 1 episode presented with body shaking, tracing was blurred by frequent muscle artifact, but there was no epileptic change.  EKG demonstrate sinus rhythm, with heart rate of 80 bpm  CONCLUSION: This is a  normal awake EEG.  There is no electrodiagnostic evidence of epileptiform discharge.  Levert Feinstein, M.D. Ph.D.  [facility] [address] [place], [place] [address] Phone: [phone] Fax:      [phone]

## 2021-06-24 NOTE — Telephone Encounter (Signed)
Please call patient, it is okay to stop Zonegran,  I will try lamotrigine as migraine prevention  Meds ordered this encounter  Medications   lamoTRIgine (LAMICTAL) 25 MG tablet    Sig: Take 1 tablet (25 mg total) by mouth daily.    Dispense:  84 tablet    Refill:  0   lamoTRIgine (LAMICTAL) 100 MG tablet    Sig: Take 1 tablet (100 mg total) by mouth 2 (two) times daily.    Dispense:  60 tablet    Refill:  11      But the description of seizure-like spells are not epileptic seizure, please make sure she follow-up with psychiatrist

## 2021-06-25 NOTE — Telephone Encounter (Signed)
Attempted to call pt, LVM for call back  °

## 2021-06-25 NOTE — Telephone Encounter (Signed)
Attempted to call pt, LVM for results and medication changes per DPR. Ask pt to call back for questions or concerns.

## 2021-06-28 ENCOUNTER — Ambulatory Visit: Payer: BC Managed Care – PPO

## 2021-06-28 ENCOUNTER — Encounter: Payer: Self-pay | Admitting: Orthopedic Surgery

## 2021-06-28 ENCOUNTER — Ambulatory Visit (INDEPENDENT_AMBULATORY_CARE_PROVIDER_SITE_OTHER): Payer: BC Managed Care – PPO | Admitting: Orthopedic Surgery

## 2021-06-28 ENCOUNTER — Other Ambulatory Visit: Payer: Self-pay

## 2021-06-28 DIAGNOSIS — R200 Anesthesia of skin: Secondary | ICD-10-CM

## 2021-06-28 DIAGNOSIS — R072 Precordial pain: Secondary | ICD-10-CM

## 2021-06-28 DIAGNOSIS — Z8249 Family history of ischemic heart disease and other diseases of the circulatory system: Secondary | ICD-10-CM

## 2021-06-28 NOTE — Progress Notes (Signed)
Office Visit Note   Patient: Julie Bennett           Date of Birth: [birth year]           MRN: [id number] Visit Date: 06/28/2021              Requested by: Bartholome Bill, [place],  [place] [address] PCP: Bartholome Bill, MD   Assessment & Plan: Visit Diagnoses:  1. Bilateral hand numbness     Plan: Reviewed EMG/nerve conduction study findings with patient which were essentially normal.  She still describes numbness in all of her fingers.  She notes that she wakes up with numbness and tingling.  She also notes numbness when she rests her arm down on something hard.  This mostly involves the ring and small finger.  Discussed trying cubital tunnel braces to help with her nocturnal symptoms and numbness with pressure on the elbow.  I can see her back in in a few months to see if she had any relief with this.  Follow-Up Instructions: No follow-ups on file.   Orders:  No orders of the defined types were placed in this encounter.  No orders of the defined types were placed in this encounter.     Procedures: No procedures performed   Clinical Data: No additional findings.   Subjective: Chief Complaint  Patient presents with   Right Hand - Numbness, Follow-up   Left Hand - Follow-up    This is a fit 46 year old right-hand-dominant female presents for follow-up of numbness involving all of her fingers.  She recently underwent EMG/nerve conduction study which was essentially normal.  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   Review of Systems   Objective: Vital Signs: LMP 08/14/2013 Comment: signed preg waiver 6.26.2020  Physical Exam  Right Hand Exam   Tenderness  The patient is experiencing no tenderness.   Range of Motion  The patient has normal right wrist ROM.   Muscle Strength  The patient has normal  right wrist strength.  Other  Erythema: absent Sensation: normal Pulse: present  Comments:  + Tinel at elbow. No ulnar nerve instability.    Left Hand Exam   Tenderness  The patient is experiencing no tenderness.   Range of Motion  The patient has normal left wrist ROM.  Muscle Strength  The patient has normal left wrist strength.  Other  Erythema: absent Sensation: normal Pulse: present  Comments:  + Tinel at elbow.  No ulnar nerve instability.      Specialty Comments:  No specialty comments available.  Imaging: No results found.   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   Premature menopause 05/23/2016   Amenorrhea 05/21/2016   Bacterial vaginosis 05/21/2016   Ovarian cyst 03/18/2014   Past Medical History:  Diagnosis Date   Amenorrhea    Anxiety    Depression    Headache    Migraine    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   Seizure ([place])  Seizures ([place])     Family History  Problem Relation Age of Onset   Ovarian cancer Mother    Hypertension Father    Throat cancer Father    CAD Brother    Diabetes Brother     Past Surgical History:  Procedure Laterality Date   DILATION AND CURETTAGE OF UTERUS     hsyteroscopy     LAPAROSCOPY     TUBAL LIGATION     Social History   Occupational History   Not on file  Tobacco Use   Smoking status: Never   Smokeless tobacco: Never  Vaping Use   Vaping Use: Never used  Substance and Sexual Activity   Alcohol use: No   Drug use: No   Sexual activity: Yes    Birth control/protection: None

## 2021-06-30 LAB — PCV CARDIAC STRESS TEST: ST Depression (mm): 0 mm

## 2021-07-03 ENCOUNTER — Other Ambulatory Visit: Payer: BC Managed Care – PPO

## 2021-07-03 ENCOUNTER — Other Ambulatory Visit: Payer: Self-pay

## 2021-07-03 ENCOUNTER — Ambulatory Visit
Admission: RE | Admit: 2021-07-03 | Discharge: 2021-07-03 | Disposition: A | Discharge status: Home / Self Care | Payer: No Typology Code available for payment source | Source: Ambulatory Visit | Attending: Cardiology | Admitting: Cardiology

## 2021-07-03 DIAGNOSIS — R072 Precordial pain: Secondary | ICD-10-CM

## 2021-07-03 DIAGNOSIS — Z8249 Family history of ischemic heart disease and other diseases of the circulatory system: Secondary | ICD-10-CM

## 2021-07-03 IMAGING — CT CT CARDIAC CORONARY ARTERY CALCIUM SCORE
3 series · 14 of 20 positions shown, 16 images · non-contrast
Comparison: [DATE]

CLINICAL DATA: Former smoker, family history

EXAM:
CT CARDIAC CORONARY ARTERY CALCIUM SCORE
TECHNIQUE: Non-contrast imaging through the heart was performed using
prospective ECG gating. Image post processing was performed on an
independent workstation, allowing for quantitative analysis of the
heart and coronary arteries. Note that this exam targets the heart
and the chest was not imaged in its entirety.

[Series 2: calcium scoring 2.00 qr36 bestdiast 69% hrt calciu · axial · 0.41mm/px · z∈[+1817,+1889]mm · 4 of 60 slices shown]
[im 12/60  vessel]
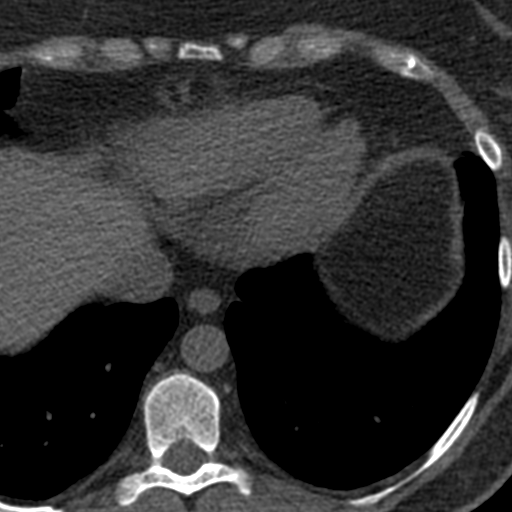
[im 24/60  vessel]
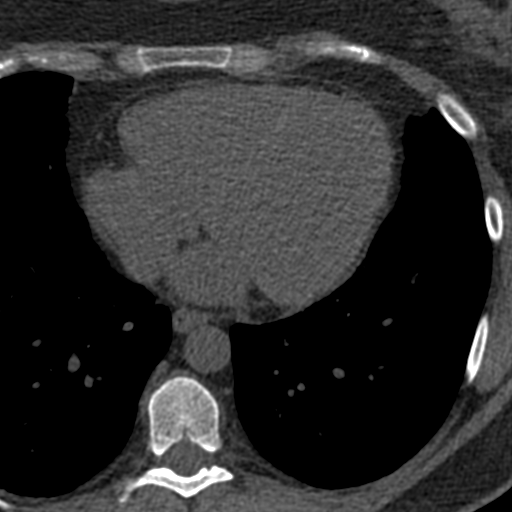
[im 36/60  vessel]
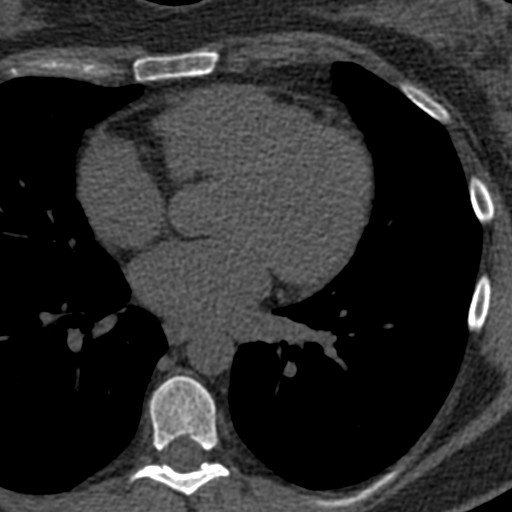
[im 48/60  vessel]
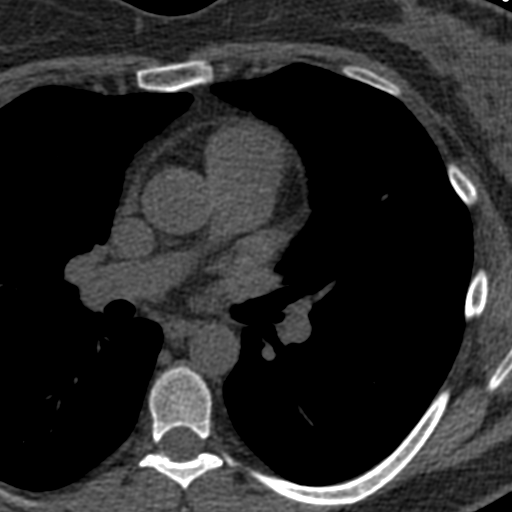

[Series 3: calcium scoring 2.00 br40 bestdiast 69% axial · axial · 0.54mm/px · z∈[+1813,+1893]mm · 5 of 60 slices shown, 7 images]
[im 10/60  vessel]
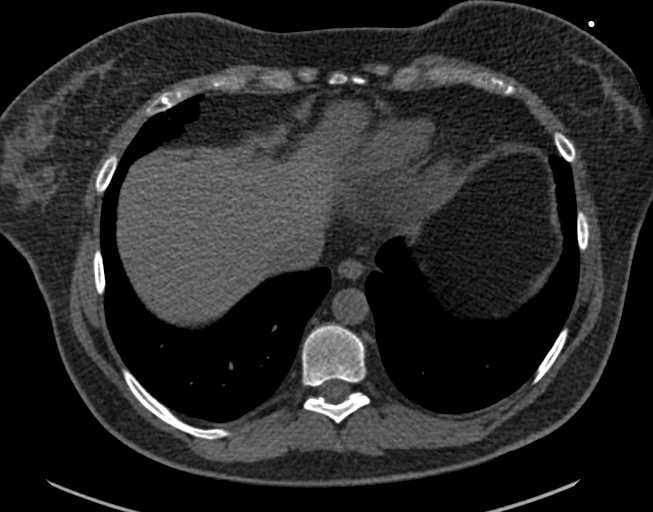
[im 10/60  lung]
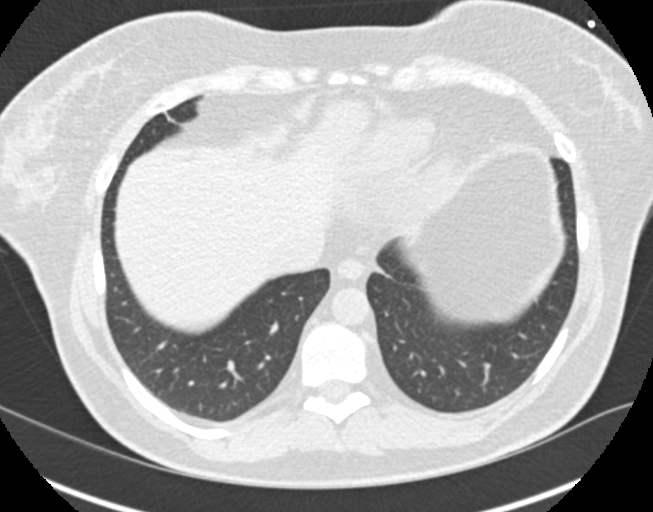
[im 20/60  vessel]
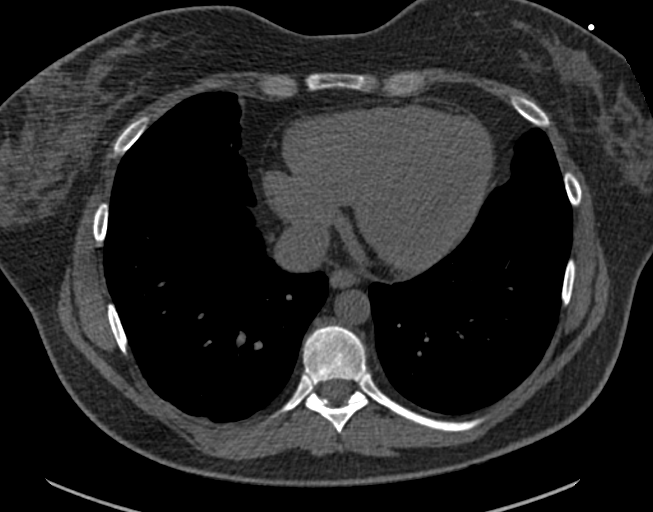
[im 30/60  vessel]
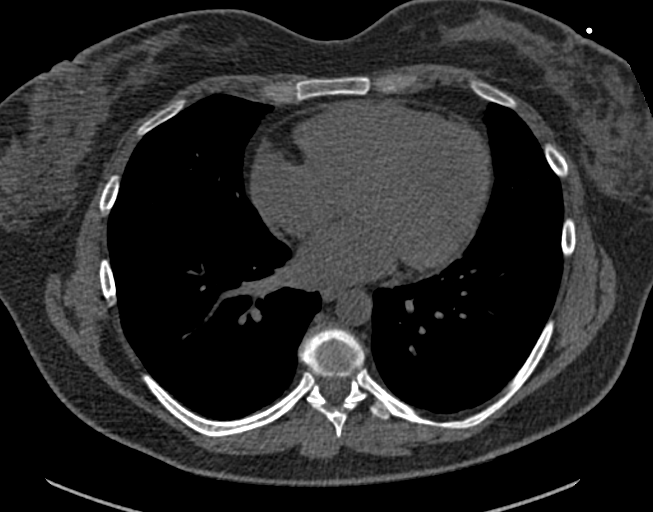
[im 40/60  vessel]
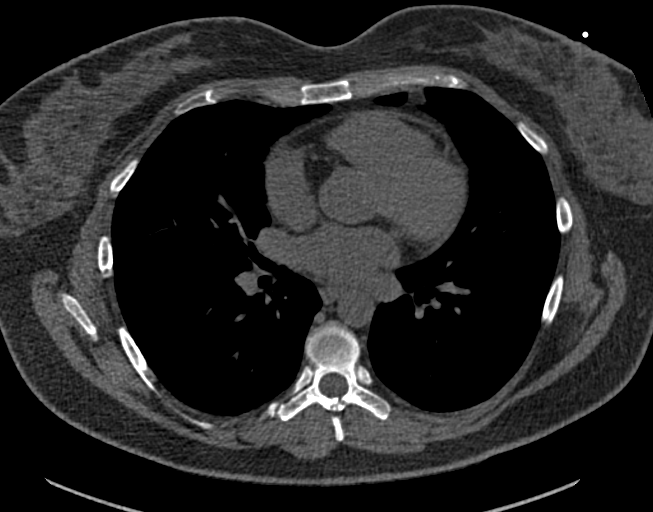
[im 50/60  vessel]
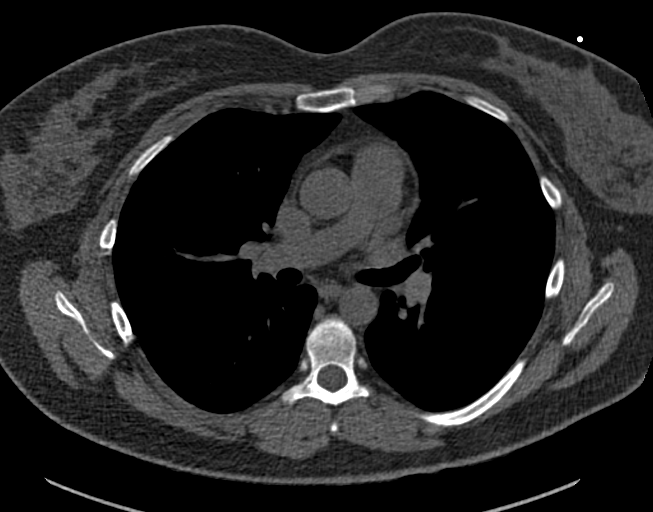
[im 50/60  lung]
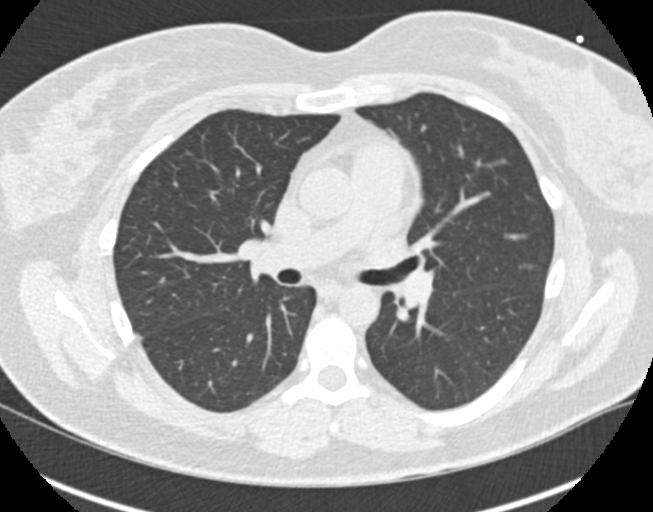

[Series 9: calcium scoring 2.00 br60 bestdiast 69% lungs · axial · 0.54mm/px · z∈[+1813,+1893]mm · 5 of 60 slices shown]
[im 10/60  vessel]
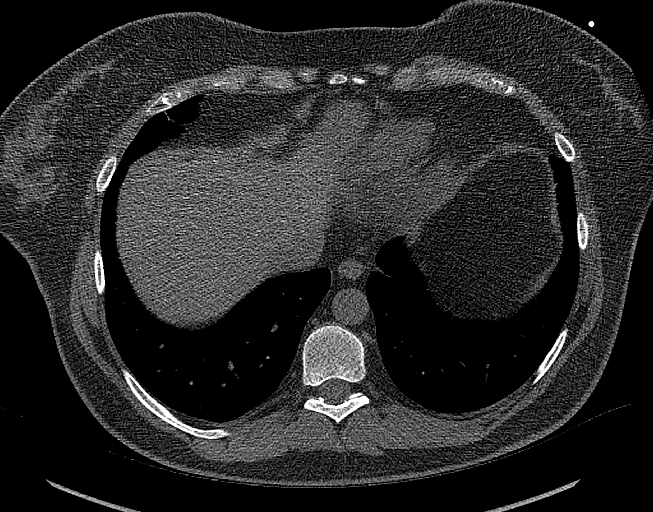
[im 20/60  vessel]
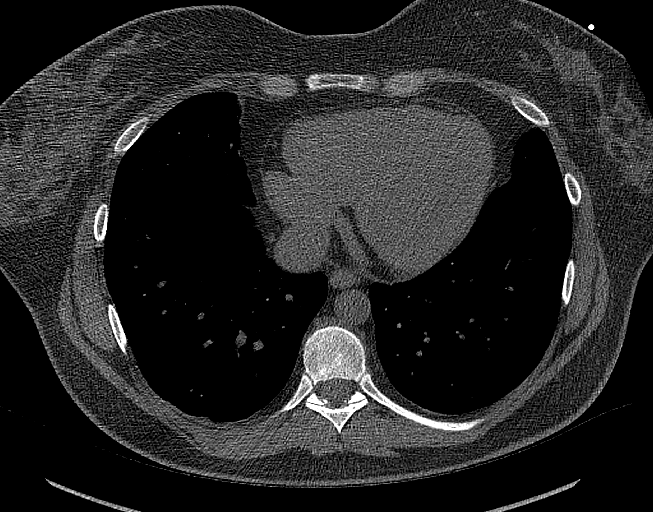
[im 30/60  vessel]
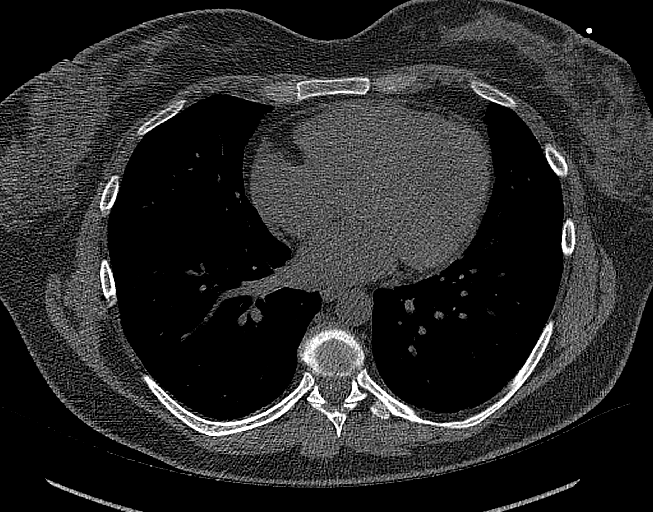
[im 40/60  vessel]
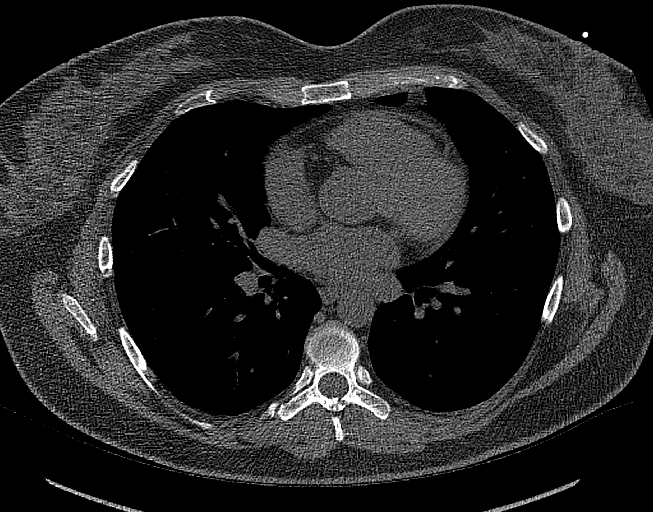
[im 50/60  vessel]
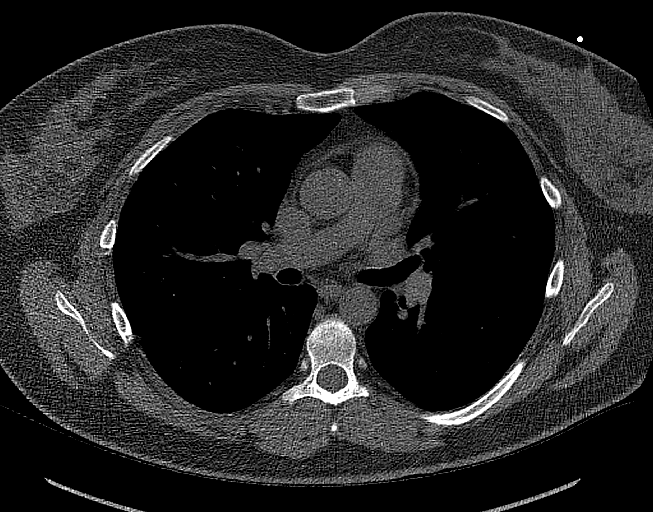

[14 of 20 positions shown; findings below may reference images not displayed]

FINDINGS: CORONARY CALCIUM SCORES:

Left Main: 0

LAD: 0

LCx: 0

RCA: 0

Total Agatston Score: 0

[HOSPITAL] percentile: 0

AORTA MEASUREMENTS:

Ascending Aorta: 28 mm

Descending Aorta: 21 mm

OTHER FINDINGS:

Heart is normal size. Aorta normal caliber. No adenopathy. No
confluent airspace opacities or effusions. No acute findings in the
upper abdomen. Chest wall soft tissues are unremarkable. No acute
bony abnormality.
IMPRESSION: No visible coronary artery calcifications. Total coronary calcium
score of 0.

No acute or significant extracardiac abnormality.

## 2021-07-04 ENCOUNTER — Other Ambulatory Visit: Payer: Self-pay | Admitting: Cardiology

## 2021-07-04 DIAGNOSIS — R072 Precordial pain: Secondary | ICD-10-CM

## 2021-07-22 ENCOUNTER — Encounter: Payer: Self-pay | Admitting: Neurology

## 2021-07-22 ENCOUNTER — Other Ambulatory Visit: Payer: Self-pay | Admitting: *Deleted

## 2021-07-22 MED ORDER — LAMOTRIGINE 25 MG PO TABS: ORAL_TABLET | ORAL | 0 refills | Status: DC

## 2021-07-22 NOTE — Telephone Encounter (Signed)
I spoke to the patient. She has already been diagnosed with psychogenic nonepileptic seizures. We discussed her seeking psychiatric care last month. She is pursuing the consultation now. Additionally, she never started the lamotrigine provided for migraine prevention. She is agreeable to pick it up today and begin titrating up the dosage (instructions discussed in detail). She is aware to stop if a rash develops and contact our office. She understands to use her rescue drugs sparingly. I discussed medication overuse headaches. She will keep her pending follow up.

## 2021-08-08 ENCOUNTER — Ambulatory Visit: Payer: BC Managed Care – PPO | Admitting: Neurology

## 2021-08-28 NOTE — Progress Notes (Incomplete)
CARDIOLOGY CONSULT NOTE  ? ? ? ? ? ?Patient ID: ?Julie Bennett ?MRN: [id number] ?DOB/AGE: 11-16-1975 46 y.o. ? ?Admit date: (Not on file) ?Referring Physician: Krista Blue ?Primary Physician: Bartholome Bill, MD ?Primary Cardiologist: New ?Reason for Consultation: MS changes ? ?Active Problems: ?  * No active hospital problems. * ? ? ?HPI:  46 y.o. with history of drug use. Migraines non epileptic seizures with ? Dystonia. Seen by Dr Krista Blue. On Lamictal Bilateral UE neuropathy with negative EMG study I saw her in 2020 for atypical chest pain She r/o and had no coronary calcium on CTA  ? 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 ?ECG normal ETT 06/28/20 normal with no arrhythmia  ?Monitor 07/19/21 NSR rare PAC/PVC no significant arrhythmia ?Calcium Score 0 07/03/21  ? ?Seen by Dr Virgina Jock 06/12/21 while inhospital for dystonia and seizure like activity No LOC EEG negative CT/MRI head  negative CTA neck no carotid dx Can get sharp chest pain after shaking activity and palpitations  ? ?*** ? ?ROS ?All other systems reviewed and negative except as noted above ? ?Past Medical History:  ?Diagnosis Date  ?? Amenorrhea   ?? Anxiety   ?? Depression   ?? Headache   ?? Migraine   ?? 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 Seizure ([place])   ?? Seizures ([place])   ?  ?Family History  ?Problem Relation Age of Onset  ?? Ovarian cancer Mother   ?? Hypertension Father   ?? Throat cancer Father   ?? CAD Brother   ?? Diabetes Brother   ?  ?Social History  ? ?Socioeconomic History  ?? Marital status: Married  ?  Spouse name: Not on file  ?? Number of children: Not on file  ?? Years of education: Not on file  ?? Highest education level: Not on file  ?Occupational History  ?? Not on file  ?Tobacco Use  ?? Smoking status: Never  ?? Smokeless tobacco: Never  ?Vaping Use  ?? Vaping Use: Never used  ?Substance and Sexual Activity  ?? Alcohol use: No  ?? Drug use: No  ??  Sexual activity: Yes  ?  Birth control/protection: None  ?Other Topics Concern  ?? Not on file  ?Social History Narrative  ?? Not on file  ? ?Social Determinants of Health  ? ?Financial Resource Strain: Not on file  ?Food Insecurity: Not on file  ?Transportation Needs: Not on file  ?Physical Activity: Not on file  ?Stress: Not on file  ?Social Connections: Not on file  ?Intimate Partner Violence: Not on file  ?  ?Past Surgical History:  ?Procedure Laterality Date  ?? DILATION AND CURETTAGE OF UTERUS    ?? hsyteroscopy    ?? LAPAROSCOPY    ?? TUBAL LIGATION    ?  ? ? ?Current Outpatient Medications:  ??  escitalopram (LEXAPRO) 10 MG tablet, Take 10 mg by mouth daily., Disp: , Rfl:  ??  esomeprazole (NEXIUM) 40 MG capsule, Take 40 mg by mouth daily., Disp: , Rfl:  ??  estradiol (ESTRACE) 2 MG tablet, Take 2 mg by mouth daily., Disp: , Rfl:  ??  famotidine (PEPCID) 10 MG tablet, Take 1 tablet (10 mg total) by mouth at bedtime., Disp: 30 tablet, Rfl: 0 ??  lamoTRIgine (LAMICTAL) 100 MG tablet, Take 1 tablet (100 mg total) by mouth 2 (two) times daily., Disp: 60 tablet, Rfl: 11 ??  lamoTRIgine (  LAMICTAL) 25 MG tablet, One tab BID x 1 wk, then two tabs BID x 1 wk, then three tab BID x 1 week, 100mg  BID therafter (separate rx)., Disp: 84 tablet, Rfl: 0 ??  medroxyPROGESTERone (PROVERA) 5 MG tablet, Take 5 mg by mouth daily., Disp: , Rfl:  ??  metoprolol tartrate (LOPRESSOR) 25 MG tablet, TAKE 1 TABLET BY MOUTH TWICE A DAY START 2 DAYS BEORE CT SCAN, Disp: 10 tablet, Rfl: 1 ??  nitroGLYCERIN (NITROSTAT) 0.4 MG SL tablet, Place 1 tablet (0.4 mg total) under the tongue every 5 (five) minutes as needed for chest pain., Disp: 25 tablet, Rfl: 1 ??  promethazine (PHENERGAN) 12.5 MG tablet, Take 12.5 mg by mouth every 6 (six) hours as needed for nausea or vomiting., Disp: , Rfl:  ??  rizatriptan (MAXALT-MLT) 10 MG disintegrating tablet, Take 1 tablet (10 mg total) by mouth as needed for migraine. May repeat in 2 hours if needed,  Disp: 12 tablet, Rfl: 6 ??  Vitamin D, Ergocalciferol, (DRISDOL) 1.25 MG (50000 UNIT) CAPS capsule, Take 1 capsule by mouth once a week. Take on Saturdays, Disp: , Rfl:  ? ? ? ?Physical Exam: ?Last menstrual period 08/14/2013.   ? ?Affect appropriate ?Healthy:  appears stated age ?HEENT: normal ?Neck supple with no adenopathy ?JVP normal no bruits no thyromegaly ?Lungs clear with no wheezing and good diaphragmatic motion ?Heart:  S1/S2 no murmur, no rub, gallop or click ?PMI normal ?Abdomen: benighn, BS positve, no tenderness, no AAA ?no bruit.  No HSM or HJR ?Distal pulses intact with no bruits ?No edema ?Neuro non-focal ?Skin warm and dry ?No muscular weakness ? ? ?Labs: ?  ?Lab Results  ?Component Value Date  ? WBC 7.9 05/29/2021  ? HGB 13.1 05/29/2021  ? HCT 39.2 05/29/2021  ? MCV 90.3 05/29/2021  ? PLT 276 05/29/2021  ? No results for input(s): NA, K, CL, CO2, BUN, CREATININE, CALCIUM, PROT, BILITOT, ALKPHOS, ALT, AST, GLUCOSE in the last 168 hours. ? ?Invalid input(s): LABALBU ?Lab Results  ?Component Value Date  ? CKTOTAL 374 (H) 04/14/2021  ? No results found for: CHOL ?No results found for: HDL ?No results found for: [place] ?No results found for: TRIG ?No results found for: CHOLHDL ?No results found for: LDLDIRECT  ?  ?Radiology: ?No results found. ? ?EKG: SR rate 75 normal  ? ? ?ASSESSMENT AND PLAN:  ? ?Chest Pain:  non cardiac normal ECG calcium score 0 ?Neuro:  dystonic episodes ? Non epileptic seizures on lamictal f/u with Dr Krista Blue ?Palpitations: benign monitor normal ECG f/u echo given sons history of TOF ?Migraines:  PRN [place]  ? ?TTE ? ?F/U cardiology PRN  ? ?Signed: ?Jenkins Rouge ?08/28/2021, 1:20 PM ? ? ?

## 2021-09-03 ENCOUNTER — Other Ambulatory Visit: Payer: Self-pay | Admitting: Psychiatry

## 2021-09-03 ENCOUNTER — Encounter: Payer: Self-pay | Admitting: Neurology

## 2021-09-03 MED ORDER — METHYLPREDNISOLONE 4 MG PO TBPK: ORAL_TABLET | ORAL | 0 refills | Status: DC

## 2021-09-03 NOTE — Telephone Encounter (Signed)
I spoke to the patient. She is actually taking lamotrigine 100mg  BID for her migraines now (did not tolerate zonisamide). Feels headaches have actually improved since starting lamotrigine. Reports having one headache per week, rarely a migraine. Rizatriptan usually helps but not with this last one. She is agreeable to take the Medrol dose pack. Instructed to keep pending follow up. She will call back with any concerns prior to her next appt.  ?

## 2021-09-03 NOTE — Telephone Encounter (Signed)
I sent in a short course of steroids to her pharmacy to help with her current headache. This should provide her with some relief until Dr. [name] returns to the office

## 2021-09-04 ENCOUNTER — Ambulatory Visit: Payer: BC Managed Care – PPO | Admitting: Cardiovascular Disease

## 2021-09-11 ENCOUNTER — Encounter: Payer: Self-pay | Admitting: Cardiology

## 2021-09-12 ENCOUNTER — Encounter: Payer: Self-pay | Admitting: Neurology

## 2021-09-12 ENCOUNTER — Telehealth: Payer: Self-pay | Admitting: *Deleted

## 2021-09-12 NOTE — Telephone Encounter (Signed)
Left message asking patient to return the call. ?_____________________________________ ?Patient sent a mychart message with complaints of a continued migraine (see message for more detail). Failed to respond to rizatriptan and Medrol dose pack. ? ?Dr. Krista Blue is offering the following for treatment: ? ?Please tell patient, the options are ?  ?1.  Come to clinic for Depacon infusion ?2.  To clinic for nerve block/trigger point injection ?3.  Imitrex subcutaneous injection, with home cocktail of Zofran, tizanidine, Aleve, even Benadryl. ?  ?

## 2021-09-12 NOTE — Telephone Encounter (Signed)
The patient has continued to have a persistent headache. Tried repeated doses of rizatriptan which is typically helpful. Medrol dose pack provided on 09/03/21 lessened the intensity but did not resolve it completely. Worsening today. Migraine with unilateral pain on left side with some vision disturbance. She is asking if there is an alternate treatment to take care of this acute pain.  ?

## 2021-09-12 NOTE — Telephone Encounter (Signed)
Please see other mychart message (sent earlier today). ?

## 2021-09-12 NOTE — Telephone Encounter (Signed)
Please tell patient, the options are ? ?1.  Come to clinic for Depacon infusion ?2.  To clinic for nerve block/trigger point injection ?3.  Imitrex subcutaneous injection, with home cocktail of Zofran, tizanidine, Aleve, even Benadryl, ? ?

## 2021-09-12 NOTE — Telephone Encounter (Signed)
I spoke to the patient. She would like to come in for the depacon 1gram infusion. [facility] has an opening at 3pm. Signed orders provided to them. ?

## 2021-09-12 NOTE — Telephone Encounter (Signed)
From Dr. [name]: ? ?Please tell patient, the options are: ?  ?1.  Come to clinic for Depacon infusion ?2.  To clinic for nerve block/trigger point injection ?3.  Imitrex subcutaneous injection, with home cocktail of Zofran, tizanidine, Aleve, even Benadryl. ?  ?

## 2021-09-12 NOTE — Telephone Encounter (Signed)
Left patient a message requesting a call back. 

## 2021-09-20 ENCOUNTER — Encounter: Payer: Self-pay | Admitting: Neurology

## 2021-09-25 ENCOUNTER — Ambulatory Visit (INDEPENDENT_AMBULATORY_CARE_PROVIDER_SITE_OTHER): Payer: BC Managed Care – PPO

## 2021-09-25 ENCOUNTER — Ambulatory Visit (INDEPENDENT_AMBULATORY_CARE_PROVIDER_SITE_OTHER): Payer: BC Managed Care – PPO | Admitting: Podiatry

## 2021-09-25 ENCOUNTER — Ambulatory Visit: Payer: BC Managed Care – PPO | Admitting: Podiatry

## 2021-09-25 DIAGNOSIS — T1490XA Injury, unspecified, initial encounter: Secondary | ICD-10-CM | POA: Diagnosis not present

## 2021-09-25 DIAGNOSIS — M7751 Other enthesopathy of right foot: Secondary | ICD-10-CM

## 2021-09-25 DIAGNOSIS — M779 Enthesopathy, unspecified: Secondary | ICD-10-CM

## 2021-09-25 MED ORDER — TRIAMCINOLONE ACETONIDE 10 MG/ML IJ SUSP
10.0000 mg | Freq: Once | INTRAMUSCULAR | Status: AC
  Administered 2021-09-25: 10 mg

## 2021-09-25 MED ORDER — DICLOFENAC SODIUM 75 MG PO TBEC: 75.0000 mg | DELAYED_RELEASE_TABLET | Freq: Two times a day (BID) | ORAL | 2 refills | Status: DC

## 2021-09-25 NOTE — Progress Notes (Signed)
Subjective:  ? ?Patient ID: Julie Bennett, female   DOB: 46 y.o.   MRN: [id number]  ? ?HPI ?Patient has developed a lot of pain in her right ankle and states that its hard to walk on it is just been present for the last month.  Does not remember injury ? ? ?ROS ? ? ?   ?Objective:  ?Physical Exam  ?Neurovascular status intact inflammation pain in the sinus tarsi right with swelling around the ankle joint itself with negative Homans' sign.  The pain also extends slightly distal from this area and patient is found to have good digital perfusion well oriented ? ?   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 ?   ?Plan:  ?H&P x-ray reviewed condition discussed sterile prep and injected the sinus tarsi right 3 mg Kenalog 5 mg Xylocaine and advised this patient on compression and dispensed ankle stocking.  Reappoint for [place] to recheck ? ?X-rays do indicate moderate depression of the arch possibility for arthritis of the subtalar joint right ?   ? ? ?

## 2021-09-27 ENCOUNTER — Other Ambulatory Visit: Payer: Self-pay | Admitting: Podiatry

## 2021-09-27 DIAGNOSIS — M779 Enthesopathy, unspecified: Secondary | ICD-10-CM

## 2021-10-11 ENCOUNTER — Encounter: Payer: Self-pay | Admitting: Neurology

## 2021-10-27 ENCOUNTER — Ambulatory Visit
Admission: EM | Admit: 2021-10-27 | Discharge: 2021-10-27 | Disposition: A | Discharge status: Home / Self Care | Payer: BC Managed Care – PPO | Attending: Internal Medicine | Admitting: Internal Medicine

## 2021-10-27 ENCOUNTER — Ambulatory Visit (INDEPENDENT_AMBULATORY_CARE_PROVIDER_SITE_OTHER): Payer: BC Managed Care – PPO

## 2021-10-27 DIAGNOSIS — M25512 Pain in left shoulder: Secondary | ICD-10-CM | POA: Diagnosis not present

## 2021-10-27 IMAGING — DX DG SHOULDER 2+V*L*
3 series · 3 of 3 positions shown · non-contrast
Comparison: None Available.

CLINICAL DATA: Severe left shoulder pain and decreased range of
motion for 1 week. No known injury.

EXAM:
LEFT SHOULDER - 2+ VIEW

[shoulder neutral ap]
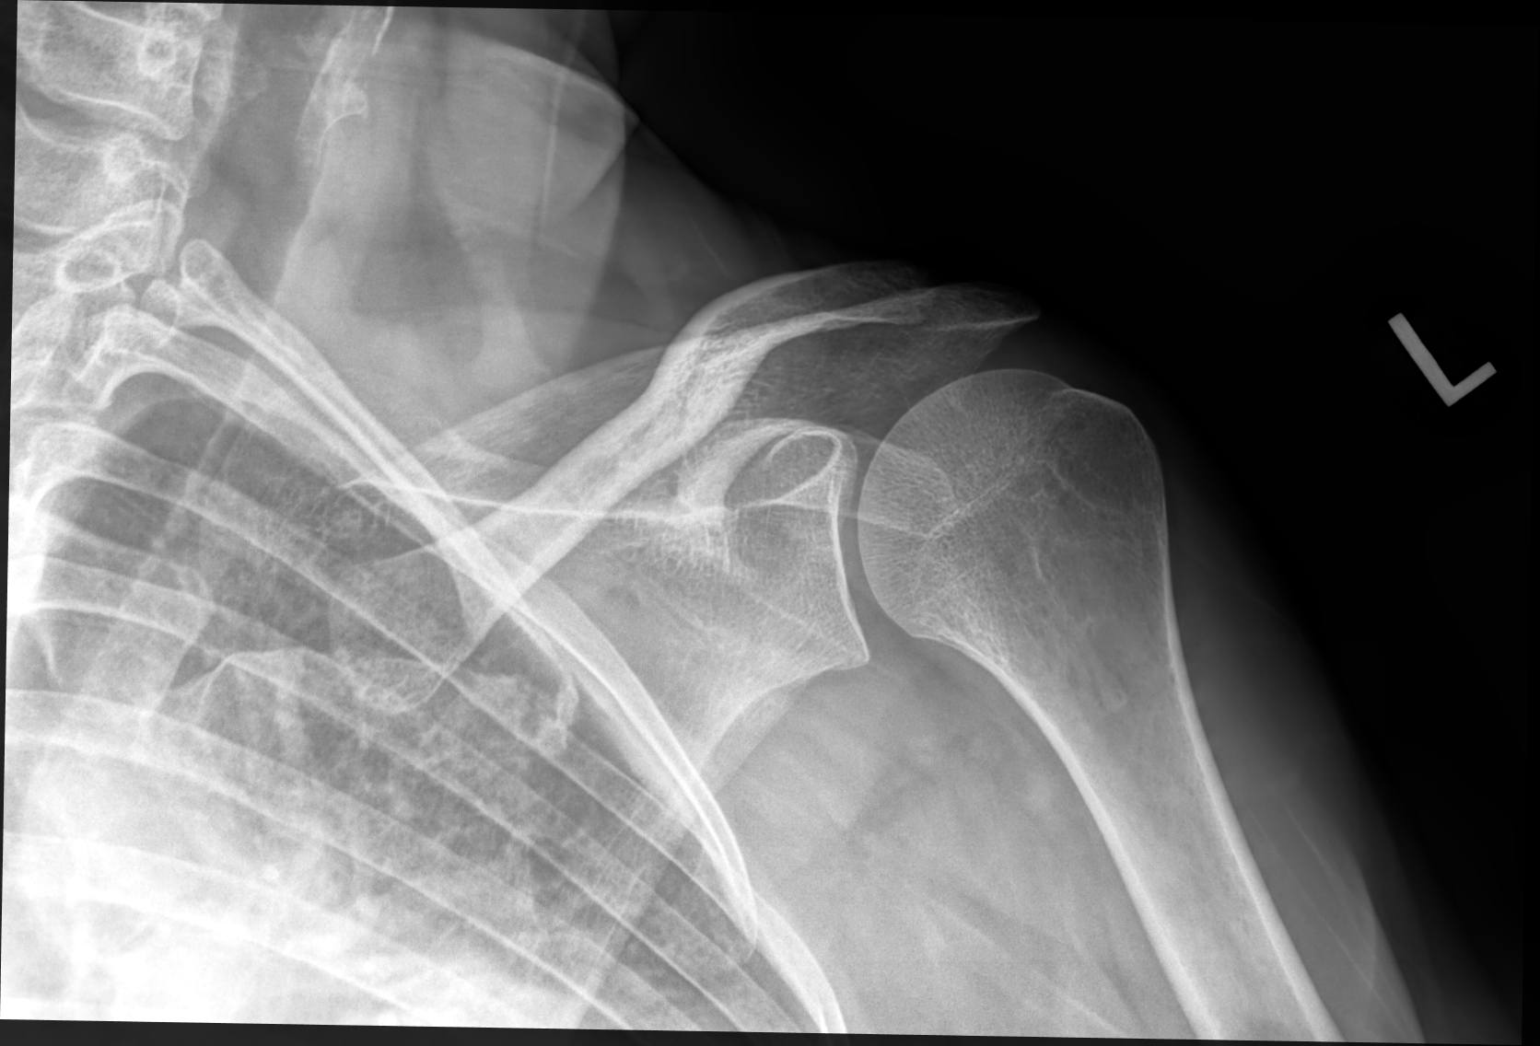

[shoulder transscapular y view (neer)]
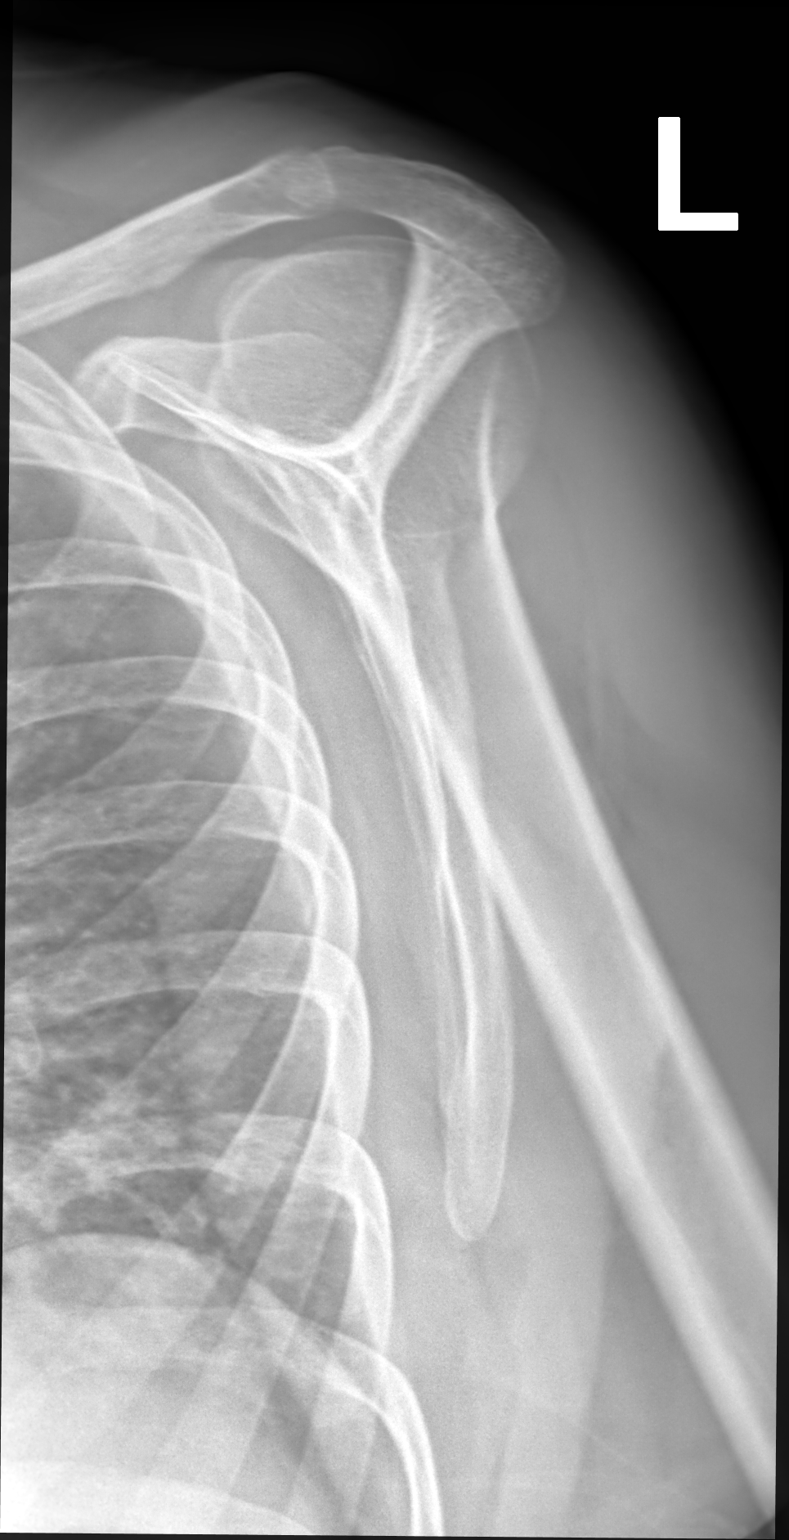

[shoulder axial 5° seated]
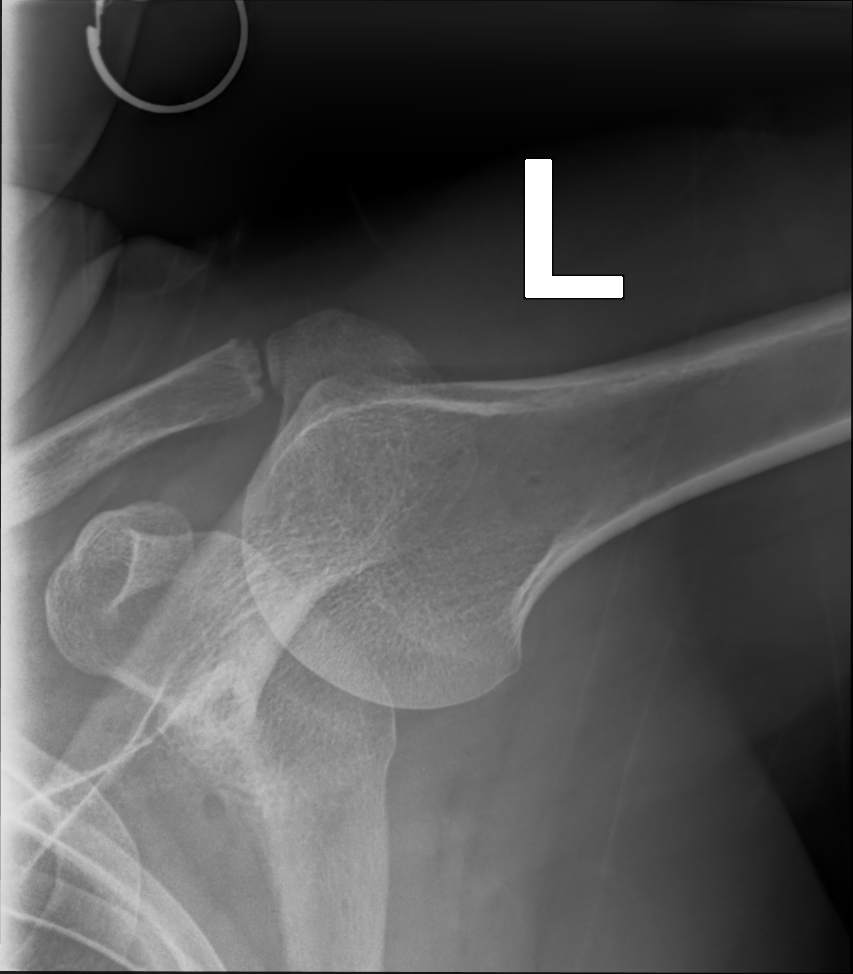

[3 of 3 positions shown; findings below may reference images not displayed]

FINDINGS: There is no evidence of fracture or dislocation. There is no
evidence of arthropathy or other focal bone abnormality. Soft
tissues are unremarkable.
IMPRESSION: Negative.

## 2021-10-27 MED ORDER — KETOROLAC TROMETHAMINE 30 MG/ML IJ SOLN
30.0000 mg | Freq: Once | INTRAMUSCULAR | Status: AC
  Administered 2021-10-27: 30 mg via INTRAMUSCULAR

## 2021-10-27 NOTE — ED Provider Notes (Signed)
[facility]    CSN: [id number] Arrival date & time: 10/27/21  0857      History   Chief Complaint Chief Complaint  Patient presents with   Shoulder Pain    Radiating pain down my arm - Entered by patient    HPI Julie Bennett is a 46 y.o. female.   Patient presents with complaints of left shoulder pain that started approximately 1 month ago.  Patient reports that pain is worsened over the past week.  Some difficulty with range of motion due to pain.  Patient has taken Tylenol intermittently as well as apply ice to affected area.  Patient reports that symptoms started approximately 1 month ago when she fell while rollerskating.  She states that she tried to catch herself and landed with her hand outstretched.  Denies any pain in any other part of the arm but the shoulder but does report that the pain radiates from the shoulder down upper arm.  Denies any numbness or tingling.   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      Home Medications    Prior to  Admission medications   Medication Sig Start Date End Date Taking? Authorizing Provider  diclofenac (VOLTAREN) 75 MG EC tablet Take 1 tablet (75 mg total) by mouth 2 (two) times daily. Patient not taking: Reported on 10/27/2021 09/25/21   Lenn Sink, DPM  escitalopram (LEXAPRO) 10 MG tablet Take 10 mg by mouth daily.    [provider]  esomeprazole (NEXIUM) 40 MG capsule Take 40 mg by mouth daily. 05/28/21   [provider]  estradiol (ESTRACE) 2 MG tablet Take 2 mg by mouth daily. 07/28/20   [provider]  famotidine (PEPCID) 10 MG tablet Take 1 tablet (10 mg total) by mouth at bedtime. 09/29/20   Evlyn Kanner, MD  lamoTRIgine (LAMICTAL) 100 MG tablet Take 1 tablet (100 mg total) by mouth 2 (two) times daily. 06/24/21   Levert Feinstein, MD  medroxyPROGESTERone (PROVERA) 5 MG tablet Take 5 mg by mouth daily. 03/11/19   [provider]  methylPREDNISolone (MEDROL DOSEPAK) 4 MG TBPK tablet Take as directed by packaging 09/03/21   Ocie Doyne, MD  metoprolol tartrate (LOPRESSOR) 25 MG tablet TAKE 1 TABLET BY MOUTH TWICE A DAY START 2 DAYS BEORE CT SCAN Patient not taking: Reported on 10/27/2021 07/04/21   Patwardhan, Anabel Bene, MD  nitroGLYCERIN (NITROSTAT) 0.4 MG SL tablet Place 1 tablet (0.4  mg total) under the tongue every 5 (five) minutes as needed for chest pain. 06/12/21   Patwardhan, Anabel Bene, MD  promethazine (PHENERGAN) 12.5 MG tablet Take 12.5 mg by mouth every 6 (six) hours as needed for nausea or vomiting. Patient not taking: Reported on 10/27/2021    [provider]  rizatriptan (MAXALT-MLT) 10 MG disintegrating tablet Take 1 tablet (10 mg total) by mouth as needed for migraine. May repeat in 2 hours if needed Patient not taking: Reported on 10/27/2021 05/22/21   Levert Feinstein, MD  Vitamin D, Ergocalciferol, (DRISDOL) 1.25 MG (50000 UNIT) CAPS capsule Take 1 capsule by mouth once a week. Take on Saturdays 07/12/20   [provider]    Family  History Family History  Problem Relation Age of Onset   Ovarian cancer Mother    Hypertension Father    Throat cancer Father    CAD Brother    Diabetes Brother     Social History Social History   Tobacco Use   Smoking status: Never   Smokeless tobacco: Never  Vaping Use   Vaping Use: Never used  Substance Use Topics   Alcohol use: No   Drug use: No     Allergies   Plecanatide and Zonisamide   Review of Systems Review of Systems Per HPI  Physical Exam Triage Vital Signs ED Triage Vitals  Enc Vitals Group     BP 10/27/21 0953 122/77     Pulse Rate 10/27/21 0953 62     Resp 10/27/21 0953 18     Temp 10/27/21 0953 98.4 F (36.9 C)     Temp src --      SpO2 --      Weight --      Height --      Head Circumference --      Peak Flow --      Pain Score 10/27/21 0950 10     Pain Loc --      Pain Edu? --      Excl. in GC? --    No data found.  Updated Vital Signs BP 122/77   Pulse 62   Temp 98.4 F (36.9 C)   Resp 18   LMP 08/14/2013 Comment: signed preg waiver 6.26.2020  Visual Acuity Right Eye Distance:   Left Eye Distance:   Bilateral Distance:    Right Eye Near:   Left Eye Near:    Bilateral Near:     Physical Exam Constitutional:      General: She is not in acute distress.    Appearance: Normal appearance. She is not toxic-appearing or diaphoretic.  HENT:     Head: Normocephalic and atraumatic.  Eyes:     Extraocular Movements: Extraocular movements intact.     Conjunctiva/sclera: Conjunctivae normal.  Pulmonary:     Effort: Pulmonary effort is normal.  Musculoskeletal:     Comments: Tenderness to palpation to anterior and posterior shoulder.  No direct tenderness to lateral shoulder.  Pain with range of motion with any type of abduction of arm.  Grip strength is 5/5.  Neurovascular intact.  No obvious swelling, lacerations, abrasions, discoloration noted.  Neurological:     General: No focal deficit present.     Mental Status: She is  alert and oriented to person, place, and time. Mental status is at baseline.  Psychiatric:        Mood and Affect: Mood normal.        Behavior: Behavior normal.  Thought Content: Thought content normal.        Judgment: Judgment normal.     UC Treatments / Results  Labs (all labs ordered are listed, but only abnormal results are displayed) Labs Reviewed - No data to display  EKG   Radiology DG Shoulder Left  Result Date: 10/27/2021 CLINICAL DATA:  Severe left shoulder pain and decreased range of motion for 1 week. No known injury. EXAM: LEFT SHOULDER - 2+ VIEW COMPARISON:  None Available. FINDINGS: There is no evidence of fracture or dislocation. There is no evidence of arthropathy or other focal bone abnormality. Soft tissues are unremarkable. IMPRESSION: Negative. Electronically Signed   By: Danae OrleansJohn A Stahl M.D.   On: 10/27/2021 10:29    Procedures Procedures (including critical care time)  Medications Ordered in UC Medications  ketorolac (TORADOL) 30 MG/ML injection 30 mg (30 mg Intramuscular Given 10/27/21 1044)    Initial Impression / Assessment and Plan / UC Course  I have reviewed the triage vital signs and the nursing notes.  Pertinent labs & imaging results that were available during my care of the patient were reviewed by me and considered in my medical decision making (see chart for details).     Shoulder x-ray was negative for any acute bony abnormality.  Suspect muscular strain/injury.  Limited options on pain management in urgent care given the patient has history of seizures.  Unable to prescribe prednisone and muscle relaxers as this might lower her seizure threshold.  There does not appear to be any contraindications to NSAIDs in patient's chart as there is no history of kidney disease or stomach issues.  Will treat with IM Toradol.  Patient advised to use ice application as well.  Advised patient that she will need to follow-up with provided contact information  for orthopedist in the next few days for further evaluation and management.  Patient verbalized understanding and was agreeable with plan. Final Clinical Impressions(s) / UC Diagnoses   Final diagnoses:  Acute pain of left shoulder     Discharge Instructions      Your x-ray was normal.  Suspect muscular injury.  Follow-up with provided contact information for orthopedist tomorrow for further evaluation and management.  You were given an injection in urgent care today to help alleviate pain and inflammation.  Please avoid taking any ibuprofen, Advil, Aleve for at least 24 hours following this.  May apply ice to shoulder as well.    ED Prescriptions   None    PDMP not reviewed this encounter.   Gustavus BryantMound, Philopater Mucha E, [place]FNP 10/27/21 1104

## 2021-10-27 NOTE — ED Triage Notes (Signed)
Patient presents to Urgent Care with complaints of L shoulder pain since last week. Patient reports pain is 9/10 with intermittent tylenol, ice. Pt reports decreased mobility.  Pt reports no known injury

## 2021-10-27 NOTE — Discharge Instructions (Signed)
Your x-ray was normal.  Suspect muscular injury.  Follow-up with provided contact information for orthopedist tomorrow for further evaluation and management.  You were given an injection in urgent care today to help alleviate pain and inflammation.  Please avoid taking any ibuprofen, Advil, Aleve for at least 24 hours following this.  May apply ice to shoulder as well.

## 2021-11-13 ENCOUNTER — Encounter: Payer: Self-pay | Admitting: Neurology

## 2021-11-20 ENCOUNTER — Ambulatory Visit: Payer: BC Managed Care – PPO | Admitting: Adult Health

## 2021-11-25 ENCOUNTER — Telehealth: Payer: Self-pay | Admitting: Adult Health

## 2021-11-25 NOTE — Telephone Encounter (Signed)
Rescheduled 7/12 appointment with pt over the phone- NP out.

## 2021-12-02 ENCOUNTER — Encounter: Payer: Self-pay | Admitting: Adult Health

## 2021-12-02 ENCOUNTER — Ambulatory Visit (INDEPENDENT_AMBULATORY_CARE_PROVIDER_SITE_OTHER): Payer: BC Managed Care – PPO | Admitting: Adult Health

## 2021-12-02 VITALS — BP 117/76 | HR 82 | Ht 65.0 in | Wt 179.0 lb

## 2021-12-02 DIAGNOSIS — G43709 Chronic migraine without aura, not intractable, without status migrainosus: Secondary | ICD-10-CM | POA: Diagnosis not present

## 2021-12-02 DIAGNOSIS — R519 Headache, unspecified: Secondary | ICD-10-CM | POA: Diagnosis not present

## 2021-12-02 DIAGNOSIS — R400 Somnolence: Secondary | ICD-10-CM | POA: Diagnosis not present

## 2021-12-02 DIAGNOSIS — R0683 Snoring: Secondary | ICD-10-CM

## 2021-12-02 NOTE — Progress Notes (Signed)
PATIENT: Julie Bennett DOB: 04/19/1976  REASON FOR VISIT: follow up HISTORY FROM: patient PRIMARY NEUROLOGIST: Dr. Terrace Bennett  Chief Complaint  Patient presents with   Rm 4    Here alone. States she's had a couple seizures since the last visit, but not severe, almost like a lock-jaw. She gets 1-2 migraines per week and they can last up to 2 hours. She takes Maxalt when it starts and sometimes it doesn't help so she has to repeat it. Sometimes she gets a migraine in the middle of the night and it wakes her up.      HISTORY OF PRESENT ILLNESS: Today 12/02/21:  Julie Bennett is a 46 year old female with a history of migraine headaches. She returns today for follow-up. She is currently taking Lamictal 100 mg BID  for prevention and Maxalt as abortive therapy. She reports that she has 2 headaches a week. Can last up to two hours.  Headaches typically wake her up at night. She clarifies and states that she gets 2 headaches during the day but about 3 headache during the night. Sometimes she wakes up with a headache (3 days). These are mild and sometimes gets better when she gets up and gets going.   Sleep: Reports that husband has told her she snores- not sure how often. Always wakes up feeling tired. Wakes up 2-3 times a night. Sometimes its because she needs to go to the bathroom.  Nonepileptic seizures- following with psychiatry monthly- Seeing NP Romeo Apple at [facility] for emotional health.   HISTORY Julie Bennett ) Julie Bennett is a 46 year old female, seen in request by Dr. Silverio Lay, Gonzella Lex, for evaluation of Zoloft sent onset difficulty moving her body, her primary care physician is Dr. Leavy Cella, Leanora Cover, MD, initial evaluation was on May 22, 2021   I reviewed and summarized the referring note. PMHX Depression, anxiety, Lexapro 10 mg daily Chronic migraine headaches   She reported episodes of sudden onset difficulty moving her body, this happened on November 19, 21, May 19, 2021, each episodes are similar,   She described episode on May 19, 2021 in detail, went to his in-laws house to celebrate Christmas,  had sudden onset body weakness, could not move, whole body went numb, she started crying uncontrollably, jaw twisted to the left side, locked out, lasting 2 to 3 minutes, has to be helped in a sitting down position, then had limb jerking movement, trunk up-and-down movement, without loss of consciousness, paramedic was called, but was not treated at emergency room,   Whole episode lasted for more than 1 hour, had to 3 similar episode within this 1 h    Emergency presentation of April 14, 2021, muscle stiffness, slurred speech, reported sudden onset, rigid, was given Benadryl, symptoms improved afterwards, also reported episode of outside emergency room visit in November 2022, left she took CBD Gummies,  told she had dystonia reaction, she was giving Ativan, symptoms recruit improved afterwards.  EKG was normal on April 14, 2021, laboratory evaluation showed negative alcohol, CMP creatinine 1.08, potassium 3.0, CPK was mildly elevated at 374, creatinine 1.1, hemoglobin of 13.3,  She also reported history of migraine headaches, lateralized severe pounding headache, above described each episode and associated with headache on the right side, relieved after taking Tylenol, Motrin, on average she has migraine about once a week   I personally reviewed CT head without contrast April 14, 2021, no significant abnormality  CT angiogram of head and neck was normal  REVIEW OF SYSTEMS: Out of a complete 14 system review of symptoms, the patient complains only of the following symptoms, and all other reviewed systems are negative.  ALLERGIES: Allergies  Allergen Reactions   Plecanatide Diarrhea    Other reaction(s): diarrhea   Zonisamide Other (See Comments)    Side effects of dizziness, blurred vision.    HOME MEDICATIONS: Outpatient Medications Prior to  Visit  Medication Sig Dispense Refill   diclofenac (VOLTAREN) 75 MG EC tablet Take 1 tablet (75 mg total) by mouth 2 (two) times daily. (Patient not taking: Reported on 10/27/2021) 50 tablet 2   escitalopram (LEXAPRO) 10 MG tablet Take 10 mg by mouth daily.     esomeprazole (NEXIUM) 40 MG capsule Take 40 mg by mouth daily.     estradiol (ESTRACE) 2 MG tablet Take 2 mg by mouth daily.     famotidine (PEPCID) 10 MG tablet Take 1 tablet (10 mg total) by mouth at bedtime. 30 tablet 0   lamoTRIgine (LAMICTAL) 100 MG tablet Take 1 tablet (100 mg total) by mouth 2 (two) times daily. 60 tablet 11   medroxyPROGESTERone (PROVERA) 5 MG tablet Take 5 mg by mouth daily.     methylPREDNISolone (MEDROL DOSEPAK) 4 MG TBPK tablet Take as directed by packaging 1 each 0   metoprolol tartrate (LOPRESSOR) 25 MG tablet TAKE 1 TABLET BY MOUTH TWICE A DAY START 2 DAYS BEORE CT SCAN (Patient not taking: Reported on 10/27/2021) 10 tablet 1   nitroGLYCERIN (NITROSTAT) 0.4 MG SL tablet Place 1 tablet (0.4 mg total) under the tongue every 5 (five) minutes as needed for chest pain. 25 tablet 1   promethazine (PHENERGAN) 12.5 MG tablet Take 12.5 mg by mouth every 6 (six) hours as needed for nausea or vomiting. (Patient not taking: Reported on 10/27/2021)     rizatriptan (MAXALT-MLT) 10 MG disintegrating tablet Take 1 tablet (10 mg total) by mouth as needed for migraine. May repeat in 2 hours if needed (Patient not taking: Reported on 10/27/2021) 12 tablet 6   Vitamin D, Ergocalciferol, (DRISDOL) 1.25 MG (50000 UNIT) CAPS capsule Take 1 capsule by mouth once a week. Take on Saturdays     No facility-administered medications prior to visit.    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   Seizure (HCC)    Seizures (HCC)     PAST SURGICAL HISTORY: Past Surgical History:  Procedure Laterality Date   DILATION AND  CURETTAGE OF UTERUS     hsyteroscopy     LAPAROSCOPY     TUBAL LIGATION      FAMILY HISTORY: Family History  Problem Relation Age of Onset   Ovarian cancer Mother    Hypertension Father    Throat cancer Father    CAD Brother    Diabetes Brother     SOCIAL HISTORY: Social History   Socioeconomic History   Marital status: Married    Spouse name: Not on file   Number of children: Not on file   Years of education: Not on file   Highest education level: Not on file  Occupational History   Not on file  Tobacco Use   Smoking status: Never   Smokeless tobacco: Never  Vaping Use   Vaping Use: Never used  Substance and Sexual Activity   Alcohol use: No   Drug use: No  Sexual activity: Yes    Birth control/protection: None  Other Topics Concern   Not on file  Social History Narrative   Not on file   Social Determinants of Health   Financial Resource Strain: Not on file  Food Insecurity: Not on file  Transportation Needs: Not on file  Physical Activity: Not on file  Stress: Not on file  Social Connections: Not on file  Intimate Partner Violence: Not on file      PHYSICAL EXAM  Vitals:   12/02/21 1409  BP: 117/76  Pulse: 82  Weight: 179 lb (81.2 kg)  Height: 5\' 5"  (1.651 m)   Body mass index is 29.79 kg/m.  Generalized: Well developed, in no acute distress   Neurological examination  Mentation: Alert oriented to time, place, history taking. Follows all commands speech and language fluent Cranial nerve II-XII: Pupils were equal round reactive to light. Extraocular movements were full, visual field were full on confrontational test. Facial sensation and strength were normal.  Head turning and shoulder shrug  were normal and symmetric.  Neck circumference 15-1/2 inches Mallampati 1-2 + Motor: The motor testing reveals 5 over 5 strength of all 4 extremities. Good symmetric motor tone is noted throughout.  Sensory: Sensory testing is intact to soft touch on all  4 extremities. No evidence of extinction is noted.  Coordination: Cerebellar testing reveals good finger-nose-finger and heel-to-shin bilaterally.  Gait and station: Gait is normal.  Reflexes: Deep tendon reflexes are symmetric and normal bilaterally.   DIAGNOSTIC DATA (LABS, IMAGING, TESTING) - I reviewed patient records, labs, notes, testing and imaging myself where available.  Lab Results  Component Value Date   WBC 7.9 05/29/2021   HGB 13.1 05/29/2021   HCT 39.2 05/29/2021   MCV 90.3 05/29/2021   PLT 276 05/29/2021      Component Value Date/Time   NA 137 05/29/2021 0831   K 3.9 05/29/2021 0831   CL 107 05/29/2021 0831   CO2 23 05/29/2021 0831   GLUCOSE 91 05/29/2021 0831   BUN 13 05/29/2021 0831   CREATININE 1.15 (H) 05/29/2021 0831   CALCIUM 9.1 05/29/2021 0831   PROT 7.1 04/14/2021 1951   ALBUMIN 3.8 04/14/2021 1951   AST 35 04/14/2021 1951   ALT 21 04/14/2021 1951   ALKPHOS 99 04/14/2021 1951   BILITOT 0.4 04/14/2021 1951   GFRNONAA 60 (L) 05/29/2021 0831   GFRAA >60 05/17/2018 0856      ASSESSMENT AND PLAN 46 y.o. 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 here with:  Migraine headaches 2.  Morning headaches 3.  Snores 4.  Daytime fatigue  Continue labetalol 100 mg twice a day Continue Maxalt for abortive therapy Advised the patient that her headaches may be associated with obstructive sleep apnea as she is waking up with headaches in the mornings and in the middle of the night.  She is amendable to a sleep referral. For now we will hold off on starting any new medication until after the sleep referral.  However, if needed we can start CGRP injectable medication preferably Ajovy or Emgality if she is unable to wait until after her sleep referral Follow-up in 6 months or sooner if needed      08/22/2016, MSN, NP-C 12/02/2021,  2:05 PM [facility] [address], [address] [place], [place] [phone]

## 2021-12-04 ENCOUNTER — Ambulatory Visit: Payer: BC Managed Care – PPO | Admitting: Adult Health

## 2021-12-12 ENCOUNTER — Encounter: Payer: Self-pay | Admitting: Adult Health

## 2021-12-12 DIAGNOSIS — G43709 Chronic migraine without aura, not intractable, without status migrainosus: Secondary | ICD-10-CM

## 2021-12-12 NOTE — Telephone Encounter (Signed)
We didn't discuss Depression in the office visit but Lamictal can cause depression. I suggest that she discuss with the psychiatrist. Can start emgality or ajovy if needed.

## 2021-12-19 MED ORDER — EMGALITY 120 MG/ML ~~LOC~~ SOAJ: 120.0000 mg | SUBCUTANEOUS | 1 refills | Status: DC

## 2021-12-19 NOTE — Telephone Encounter (Signed)
Order placed for Emgality 120 mg SQ q30 days.

## 2021-12-26 ENCOUNTER — Encounter: Payer: Self-pay | Admitting: Adult Health

## 2021-12-26 ENCOUNTER — Telehealth: Payer: Self-pay | Admitting: *Deleted

## 2021-12-26 DIAGNOSIS — G43709 Chronic migraine without aura, not intractable, without status migrainosus: Secondary | ICD-10-CM

## 2021-12-26 NOTE — Telephone Encounter (Signed)
Completed Emgality PA on Cover My Meds. KEY: [id number]. Awaiting determination from [facility].

## 2022-01-01 ENCOUNTER — Encounter: Payer: Self-pay | Admitting: Adult Health

## 2022-01-01 ENCOUNTER — Ambulatory Visit (INDEPENDENT_AMBULATORY_CARE_PROVIDER_SITE_OTHER): Payer: BC Managed Care – PPO | Admitting: Neurology

## 2022-01-01 ENCOUNTER — Encounter: Payer: Self-pay | Admitting: Neurology

## 2022-01-01 VITALS — BP 107/68 | HR 65 | Ht 65.0 in | Wt 176.2 lb

## 2022-01-01 DIAGNOSIS — G4719 Other hypersomnia: Secondary | ICD-10-CM

## 2022-01-01 DIAGNOSIS — R0683 Snoring: Secondary | ICD-10-CM

## 2022-01-01 DIAGNOSIS — E663 Overweight: Secondary | ICD-10-CM | POA: Diagnosis not present

## 2022-01-01 DIAGNOSIS — R569 Unspecified convulsions: Secondary | ICD-10-CM

## 2022-01-01 DIAGNOSIS — R351 Nocturia: Secondary | ICD-10-CM | POA: Diagnosis not present

## 2022-01-01 DIAGNOSIS — G478 Other sleep disorders: Secondary | ICD-10-CM

## 2022-01-01 DIAGNOSIS — R519 Headache, unspecified: Secondary | ICD-10-CM

## 2022-01-01 NOTE — Progress Notes (Signed)
Subjective:    Patient ID: Julie Bennett is a 46 y.o. female.  HPI    Huston Foley, MD, PhD [facility] [address] [address] [place], [place] [address]  Dear Julie Millet and Julie Bennett,   I saw your patient, Julie Bennett, upon your kind request in my sleep clinic today for initial consultation of her sleep disorder, in particular, concern for underlying obstructive sleep apnea.  The patient is unaccompanied today.  As you know, Julie Bennett is a 46 year old right-handed woman with an underlying medical history of migraine headaches, seizure-like events, reflux disease, vitamin D deficiency, anxiety, depression, and overweight state, who reports snoring and excessive daytime somnolence as well as morning headaches.  She does not wake up rested, sometimes she has nighttime headaches. I reviewed your office note from 12/02/2021.  Her Epworth sleepiness score is 15 out of 24, fatigue severity score is 45 out of 63. She lives with her family including husband and 46 year old daughter.  They have 2 dogs and 1 cat in the household, none of the pets sleep in her bedroom.  She does have a TV on at night in her bedroom but generally they try to turn it off between 9 and 10.  She is in bed around 9 PM and rise time during the workweek is between 5 and 5:30 AM.  She works as a Runner, broadcasting/film/video.  She has some difficulty going to sleep and sometimes multiple nighttime awakenings.  She has nocturia at least twice per average night.  She drinks caffeine in the form of tea, up to 32 ounces altogether per day.  She does not currently drink any alcohol, she is a non-smoker.  She has had weight gain over the past 2+ years in the realm of 25 pounds.   She is not aware of any family history of sleep apnea.  She is no longer on gabapentin as it made her sleepy during the day.  She takes about 1 nap per day on average. 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   Seizure (HCC)    Seizures (HCC)     Her Past Surgical History Is Significant For: Past Surgical History:  Procedure Laterality Date   DILATION AND CURETTAGE OF UTERUS     hsyteroscopy     LAPAROSCOPY     TUBAL LIGATION      Her Family History Is Significant For: Family History  Problem Relation Age of Onset   Ovarian cancer Mother    Hypertension Father    Throat cancer Father    CAD Brother    Diabetes Brother    Sleep apnea Neg Hx     Her Social History Is Significant For: Social History   Socioeconomic History   Marital status: Married    Spouse name: Not on file   Number of children: Not on file   Years of education: Not on file   Highest education level: Not on file  Occupational History   Not on file  Tobacco Use   Smoking status: Never   Smokeless tobacco: Never  Vaping Use   Vaping Use: Never used  Substance and Sexual Activity   Alcohol use: No   Drug use: No   Sexual activity: Yes    Birth control/protection: None  Other Topics Concern  Not on file  Social History Narrative   Lives at home husband and kids   Right handed   Caffeine: tea multiple small cups per day    Social Determinants of Health   Financial Resource Strain: Not on file  Food Insecurity: Not on file  Transportation Needs: Not on file  Physical Activity: Not on file  Stress: Not on file  Social Connections: Not on file    Her Allergies Are:  Allergies  Allergen Reactions   Plecanatide Diarrhea    Other reaction(s): diarrhea   Zonisamide Other (See Comments)    Side effects of dizziness, blurred vision.  :   Her Current Medications Are:  Outpatient Encounter Medications as of 01/01/2022  Medication Sig   esomeprazole (NEXIUM) 40 MG capsule Take 40 mg by mouth daily.   estradiol (ESTRACE) 2 MG tablet Take 2 mg by mouth daily.   famotidine (PEPCID) 10 MG tablet  Take 1 tablet (10 mg total) by mouth at bedtime.   FLUoxetine (PROZAC) 20 MG capsule Take 20 mg by mouth daily.   Galcanezumab-gnlm (EMGALITY) 120 MG/ML SOAJ Inject 120 mg into the skin every 30 (thirty) days.   lamoTRIgine (LAMICTAL) 100 MG tablet Take 1 tablet (100 mg total) by mouth 2 (two) times daily.   medroxyPROGESTERone (PROVERA) 5 MG tablet Take 5 mg by mouth daily.   rizatriptan (MAXALT-MLT) 10 MG disintegrating tablet Take 1 tablet (10 mg total) by mouth as needed for migraine. May repeat in 2 hours if needed   Vitamin D, Ergocalciferol, (DRISDOL) 1.25 MG (50000 UNIT) CAPS capsule Take 1 capsule by mouth once a week. Take on Saturdays   diclofenac (VOLTAREN) 75 MG EC tablet Take 1 tablet (75 mg total) by mouth 2 (two) times daily. (Patient not taking: Reported on 10/27/2021)   escitalopram (LEXAPRO) 10 MG tablet Take 10 mg by mouth daily.   escitalopram (LEXAPRO) 20 MG tablet Take 20 mg by mouth daily. Takes with 10 mg for total of 30 mg per day   gabapentin (NEURONTIN) 100 MG capsule Take 100 mg by mouth 3 (three) times daily.   methylPREDNISolone (MEDROL DOSEPAK) 4 MG TBPK tablet Take as directed by packaging   metoprolol tartrate (LOPRESSOR) 25 MG tablet TAKE 1 TABLET BY MOUTH TWICE A DAY START 2 DAYS BEORE CT SCAN (Patient not taking: Reported on 12/02/2021)   nitroGLYCERIN (NITROSTAT) 0.4 MG SL tablet Place 1 tablet (0.4 mg total) under the tongue every 5 (five) minutes as needed for chest pain. (Patient not taking: Reported on 12/02/2021)   promethazine (PHENERGAN) 12.5 MG tablet Take 12.5 mg by mouth every 6 (six) hours as needed for nausea or vomiting.   No facility-administered encounter medications on file as of 01/01/2022.  :   Review of Systems:  Out of a complete 14 point review of systems, all are reviewed and negative with the exception of these symptoms as listed below:  Review of Systems  Neurological:        Pt here for sleep consult Pt states she  snores,fatigue,headaches. Pt denies hypertension ,sleep study,CPAP machine     ESS:15 FSS:45    Objective:  Neurological Exam  Physical Exam Physical Examination:   Vitals:   01/01/22 0929  BP: 107/68  Pulse: 65    General Examination: The patient is a very pleasant 46 y.o. female in no acute distress. She appears well-developed and well-nourished and well groomed.   HEENT: Normocephalic, atraumatic, pupils are equal, round and reactive to light, extraocular tracking  is good without limitation to gaze excursion or nystagmus noted. Hearing is grossly intact. Face is symmetric with normal facial animation. Speech is clear with no dysarthria noted. There is no hypophonia. There is no lip, neck/head, jaw or voice tremor. Neck is supple with full range of passive and active motion. There are no carotid bruits on auscultation. Oropharynx exam reveals: mild mouth dryness, adequate dental hygiene and mild airway crowding, due to smaller airway entry, wider uvula, Mallampati class II.  She has some difficulty with mouth opening today, reports that she recently had to teeth pulled.  Mild overbite.  Tongue protrudes centrally and palate elevates symmetrically, smaller tonsils noted.  Neck circumference of 15-3/8 inches.  Chest: Clear to auscultation without wheezing, rhonchi or crackles noted.  Heart: S1+S2+0, regular and normal without murmurs, rubs or gallops noted.   Abdomen: Soft, non-tender and non-distended.  Extremities: There is no pitting edema in the distal lower extremities bilaterally.   Skin: Warm and dry without trophic changes noted.   Musculoskeletal: exam reveals no obvious joint deformities.   Neurologically:  Mental status: The patient is awake, alert and oriented in all 4 spheres. Her immediate and remote memory, attention, language skills and fund of knowledge are appropriate. There is no evidence of aphasia, agnosia, apraxia or anomia. Speech is clear with normal  prosody and enunciation. Thought process is linear. Mood is normal and affect is normal.  Cranial nerves II - XII are as described above under HEENT exam.  Motor exam: Normal bulk, strength and tone is noted. There is no obvious tremor. Fine motor skills and coordination: grossly intact.  Cerebellar testing: No dysmetria or intention tremor. There is no truncal or gait ataxia.  Sensory exam: intact to light touch in the upper and lower extremities.  Gait, station and balance: She stands easily. No veering to one side is noted. No leaning to one side is noted. Posture is age-appropriate and stance is narrow based. Gait shows normal stride length and normal pace. No problems turning are noted.   Assessment and Plan:  In summary, Julie Bennett is a very pleasant 46 y.o.-year old female with an underlying medical history of migraine headaches, seizure-like events, reflux disease, vitamin D deficiency, anxiety, depression, and overweight state, whose history and physical exam concerning for sleep disordered breathing, supporting a current working diagnosis of unspecified sleep apnea, with the main differential diagnoses of obstructive sleep apnea (OSA) versus upper airway resistance syndrome (UARS) versus central sleep apnea (CSA), or mixed sleep apnea. A laboratory attended sleep study is considered gold standard for evaluation of sleep disordered breathing and is recommended at this time and clinically justified.   I had a long chat with the patient about my findings and the diagnosis of sleep apnea, particularly OSA, its prognosis and treatment options. We talked about medical/conservative treatments, surgical interventions and non-pharmacological approaches for symptom control. I explained, in particular, the risks and ramifications of untreated moderate to severe OSA, especially with respect to developing cardiovascular disease down the road, including congestive heart failure (CHF), difficult to treat  hypertension, cardiac arrhythmias (particularly A-fib), neurovascular complications including TIA, stroke and dementia. Even type 2 diabetes has, in part, been linked to untreated OSA. Symptoms of untreated OSA may include (but may not be limited to) daytime sleepiness, nocturia (i.e. frequent nighttime urination), memory problems, mood irritability and suboptimally controlled or worsening mood disorder such as depression and/or anxiety, lack of energy, lack of motivation, physical discomfort, as well as recurrent headaches, especially  morning or nocturnal headaches. We talked about the importance of maintaining a healthy lifestyle and striving for healthy weight. In addition, we talked about the importance of striving for and maintaining good sleep hygiene. I recommended the following at this time: sleep study.  I outlined the differences between a laboratory attended sleep study which is considered more comprehensive and accurate over the option of a home sleep test (HST); the latter may lead to underestimation of sleep disordered breathing in some instances and does not help with diagnosing upper airway resistance syndrome and is not accurate enough to diagnose primary central sleep apnea typically. I explained the different sleep test procedures to the patient in detail and also outlined possible surgical and non-surgical treatment options of OSA, including the use of a pressure airway pressure (PAP) device (ie CPAP, AutoPAP/APAP or BiPAP in certain circumstances), a custom-made dental device (aka oral appliance, which would require a referral to a specialist dentist or orthodontist typically, and is generally speaking not considered a good choice for patients with full dentures or edentulous state), upper airway surgical options, such as traditional UPPP (which is not considered a first-line treatment) or the Inspire device (hypoglossal nerve stimulator, which would involve a referral for consultation with an  ENT surgeon, after careful selection, following inclusion criteria). I explained the PAP treatment option to the patient in detail, as this is generally considered first-line treatment.  The patient indicated that she would be willing to try PAP therapy, if the need arises. I explained the importance of being compliant with PAP treatment, not only for insurance purposes but primarily to improve patient's symptoms symptoms, and for the patient's long term health benefit, including to reduce Her cardiovascular risks longer-term.    We will pick up our discussion about the next steps and treatment options after testing.  We will keep her posted as to the test results by phone call and/or MyChart messaging where possible.  We will plan to follow-up in sleep clinic accordingly as well.  I answered all her questions today and the patient was in agreement.   I encouraged her to call with any interim questions, concerns, problems or updates or email [place] through MyChart.  Generally speaking, sleep test authorizations may take up to 2 weeks, sometimes less, sometimes longer, the patient is encouraged to get in touch with [place] if they do not hear back from the sleep lab staff directly within the next 2 weeks.  Thank you very much for allowing me to participate in the care of this nice patient. If I can be of any further assistance to you please do not hesitate to call me at [phone].  Sincerely,   Huston Foley, MD, PhD

## 2022-01-01 NOTE — Patient Instructions (Signed)

## 2022-01-02 NOTE — Telephone Encounter (Signed)
Received a fax from [facility] [employer] for [name] health plan that stated Julie Bennett has been denied.  The plan covers this when the patient is not using it with another CGRP receptor antagonist.  I called [employer] [facility] and left a voicemail for [place] asking for a provider courtesy review as the patient is not on another CGRP receptor antagonist.  If needed, an urgent appeal request can be faxed to [phone].

## 2022-01-02 NOTE — Telephone Encounter (Signed)
Called [employer] and LVM for [name] to request a provider courtesy review for patient. Will chart more details in the phone note labeled Emgality PA.

## 2022-01-06 ENCOUNTER — Encounter: Payer: Self-pay | Admitting: *Deleted

## 2022-01-06 NOTE — Telephone Encounter (Signed)
I received a call back today from [facility]. They are saying the PA shows patient is on another CGRP antagonist. The PA we completed doesn't reflect that. Regardless, we have to do an appeal and cannot do it over the phone.

## 2022-01-06 NOTE — Telephone Encounter (Signed)
Appeal letter written, printed, and is pending Megan NP's signature.

## 2022-01-06 NOTE — Telephone Encounter (Signed)
Called Julie Bennett a second time [employer] [phone] and LVM asking for call back.

## 2022-01-08 ENCOUNTER — Encounter: Payer: Self-pay | Admitting: Adult Health

## 2022-01-08 NOTE — Telephone Encounter (Signed)
Attempted to fax the appeal to [facility] multiple times [phone]) but fax would not go through. I called [facility] and was able to actually complete a new PA over the phone with [place]es Coupe and got it approved immediately from 01/08/22 - 04/10/22. PA # [id number]. Patient has been updated via mychart.

## 2022-01-13 ENCOUNTER — Telehealth: Payer: Self-pay | Admitting: Neurology

## 2022-01-13 NOTE — Telephone Encounter (Signed)
bcbs state no auth req- [employer] healthy blue pending uploaded notes on the portal

## 2022-01-16 ENCOUNTER — Institutional Professional Consult (permissible substitution): Payer: BC Managed Care – PPO | Admitting: Neurology

## 2022-01-29 NOTE — Telephone Encounter (Signed)
LVM for pt to call back to schedule sleep study.  

## 2022-02-04 NOTE — Telephone Encounter (Signed)
NPSG- [employer] state no auth req- [employer] healthy blue no auth req due to [employer]- pt chose.  Patient is scheduled at [facility] for 03/02/22 at 9 pm.  Mailed packet to the patient.

## 2022-02-18 ENCOUNTER — Encounter: Payer: Self-pay | Admitting: Adult Health

## 2022-02-24 MED ORDER — SUMATRIPTAN SUCCINATE 50 MG PO TABS: ORAL_TABLET | ORAL | 11 refills | Status: DC

## 2022-03-05 MED ORDER — UBRELVY 100 MG PO TABS: ORAL_TABLET | ORAL | 11 refills | Status: AC

## 2022-03-05 NOTE — Addendum Note (Signed)
Addended by: Trudie Buckler on: 03/05/2022 09:15 AM   Modules accepted: Orders

## 2022-03-18 NOTE — Telephone Encounter (Signed)
New PA submitted via CMM Key: [facility] Your information has been submitted to [employer]. If [employer] has not responded to your request within 24 hours, contact [place] at [phone]. I

## 2022-03-19 MED ORDER — EMGALITY 120 MG/ML ~~LOC~~ SOAJ: 120.0000 mg | SUBCUTANEOUS | 11 refills | Status: DC

## 2022-03-19 NOTE — Addendum Note (Signed)
Addended by: Brandon Melnick on: 03/19/2022 09:06 AM   Modules accepted: Orders

## 2022-03-19 NOTE — Telephone Encounter (Signed)
Received fax confirmation of approval for Emgality 120 mg/ml 03/18/22 thru 03/19/2023. [facility] PA# [id number] [place].  [phone].

## 2022-03-23 ENCOUNTER — Ambulatory Visit (INDEPENDENT_AMBULATORY_CARE_PROVIDER_SITE_OTHER): Payer: BC Managed Care – PPO | Admitting: Neurology

## 2022-03-23 DIAGNOSIS — R569 Unspecified convulsions: Secondary | ICD-10-CM

## 2022-03-23 DIAGNOSIS — R0683 Snoring: Secondary | ICD-10-CM | POA: Diagnosis not present

## 2022-03-23 DIAGNOSIS — G4720 Circadian rhythm sleep disorder, unspecified type: Secondary | ICD-10-CM

## 2022-03-23 DIAGNOSIS — E663 Overweight: Secondary | ICD-10-CM

## 2022-03-23 DIAGNOSIS — G478 Other sleep disorders: Secondary | ICD-10-CM

## 2022-03-23 DIAGNOSIS — G4719 Other hypersomnia: Secondary | ICD-10-CM

## 2022-03-23 DIAGNOSIS — R351 Nocturia: Secondary | ICD-10-CM

## 2022-03-23 DIAGNOSIS — G4733 Obstructive sleep apnea (adult) (pediatric): Secondary | ICD-10-CM

## 2022-03-23 DIAGNOSIS — R519 Headache, unspecified: Secondary | ICD-10-CM

## 2022-04-01 NOTE — Procedures (Signed)
Physician Interpretation:     [facility] POLYSOMNOGRAPHY  INTERPRETATION REPORT   STUDY DATE:  03/23/2022     PATIENT NAME:  Julie Bennett         DATE OF BIRTH:  [birth year]  PATIENT ID:  [id number]    TYPE OF STUDY:  PSG  READING PHYSICIAN: Star Age, MD, PhD REFERRED BY: Ward Givens, NP SCORING TECHNICIAN: Richard Miu, RPSGT   HISTORY:  46 year old female with a history of migraine headaches, seizure-like events, reflux disease, vitamin D deficiency, anxiety, depression, and overweight state, who reports snoring and excessive daytime somnolence as well as morning headaches. ?Her Epworth sleepiness score is 15 out of 24, fatigue severity score is 45 out of 63. Height: 65 in Weight: 176 lb (BMI 29) Neck Size: 16 in  MEDICATIONS: Nexium, Estrace, Pepcid, Prozac, Emgality, Lamictal, Provera, Maxalt, Drisdol, Voltaren, Lexapro, Methylprednisolone, Lopressor, Nitrostat, Phenergan, Neurontin TECHNICAL DESCRIPTION: A registered sleep technologist was in attendance for the duration of the recording.  Data collection, scoring, video monitoring, and reporting were performed in compliance with the AASM Manual for the Scoring of Sleep and Associated Events; (Hypopnea is scored based on the criteria listed in Section VIII D. 1b in the AASM Manual V2.6 using a 4% oxygen desaturation rule or Hypopnea is scored based on the criteria listed in Section VIII D. 1a in the AASM Manual V2.6 using 3% oxygen desaturation and /or arousal rule).   SLEEP CONTINUITY AND SLEEP ARCHITECTURE:  Lights-out was at 21:25: and lights-on at  05:04: with a total recording time of 7 hours, 39. min. Total sleep time ( TST) was 379.5 minutes with a mildly decreased sleep efficiency at 82.7%.  BODY POSITION:  TST was divided  between the following sleep positions: 0.0% supine;  100.0% lateral;  0% prone. Duration of total sleep and percent of total sleep in their respective position is as  follows: supine 00 minutes (0%), non-supine 380 minutes (100%); right 191 minutes (50%), left 188 minutes (50%), and prone 00 minutes (0%).  Total supine REM sleep time was 00 minutes (0% of total REM sleep). Sleep latency was increased at 38.0 minutes.  REM sleep latency was increased at 159.5 minutes. Of the total sleep time, the percentage of stage N1 sleep was 2.4%, stage N2 sleep was 63%, which is increased, stage N3 sleep was 13.4%, which is mildly reduced, and REM sleep was 21.6%, which is normal. Wake after sleep onset (WASO) time accounted for 41.5 minutes with mild to moderate sleep fragmentation noted.  RESPIRATORY MONITORING:   Based on CMS criteria (using a 4% oxygen desaturation rule for scoring hypopneas), there were 0 apneas (0 obstructive; 0 central; 0 mixed), and 45 hypopneas.  Apnea index was 0.0. Hypopnea index was 7.1. The apnea-hypopnea index was 7.1 overall (0.0 supine, 5 non-supine; 5.1 REM, 0.0 supine REM).  There were 0 respiratory effort-related arousals (RERAs).  The RERA index was 0 events/h. Total respiratory disturbance index (RDI) was 7.1 events/h. RDI results showed: supine RDI  0.0 /h; non-supine RDI 7.1 /h; REM RDI 5.1 /h, supine REM RDI 0.0 /h.   Based on AASM criteria (using a 3% oxygen desaturation and /or arousal rule for scoring hypopneas), there were 0 apneas (0 obstructive; 0 central; 0 mixed), and 97 hypopneas. Apnea index was 0.0. Hypopnea index was 15.3. The apnea-hypopnea index was 15.3/hour overall (0.0 supine, 12 non-supine; 11.7 REM, 0.0 supine REM).  There were 0 respiratory effort-related arousals (RERAs).  The RERA index was  0 events/h. Total respiratory disturbance index (RDI) was 15.3 events/h. RDI results showed: supine RDI  0.0 /h; non-supine RDI 15.3 /h; REM RDI 11.7 /h, supine REM RDI 0.0 /h.  OXIMETRY: Oxyhemoglobin Saturation Nadir during sleep was at  87%) from a mean of 94%.  Of the Total sleep time (TST)   hypoxemia (=<88%) was present for  0.2  minutes, or 0.0% of total sleep time.  LIMB MOVEMENTS: There were 0 periodic limb movements of sleep (0.0/hr), of which 0 (0.0/hr) were associated with an arousal. AROUSAL: There were 57 arousals in total, for an arousal index of 9 arousals/hour.  Of these, 17 were identified as respiratory-related arousals (3 /h), 0 were PLM-related arousals (0 /h), and 45 were non-specific arousals (7 /h). EEG: Review of the EEG showed no abnormal electrical discharges and symmetrical bihemispheric findings.   EKG: The EKG revealed normal sinus rhythm (NSR). The average heart rate during sleep was 68 bpm.  AUDIO/VIDEO REVIEW: The audio and video review did not show any abnormal or unusual behaviors, movements, phonations or vocalizations. The patient took 1 bathroom break. Snoring was in the mild to moderate range. POST-STUDY QUESTIONNAIRE: Post study, the patient indicated, that sleep was the same as usual. IMPRESSION:   1. Obstructive sleep apnea (OSA) 2. Dysfunctions associated with sleep stages or arousal from sleep RECOMMENDATIONS:  1. This study demonstrates moderate obstructive sleep apnea, with a total AHI of 15.3/hour, REM AHI of 27/hour, and O2 nadir of 87%.  The absence of supine sleep may have resulted in an underestimation of her sleep disordered breathing.  Treatment with positive airway pressure in the form of CPAP is recommended. The patient will be advised to proceed with AutoPap therapy at home.  Other treatment options may include (generally speaking) surgical options in selected patients or the use of an oral appliance in certain patients. Concomitant weight loss is recommended, where clinically appropriate. Please note that untreated obstructive sleep apnea may carry additional perioperative morbidity. Patients with significant obstructive sleep apnea should receive perioperative PAP therapy and the surgeons and particularly the anesthesiologist should be informed of the diagnosis and the severity  of the sleep disordered breathing. 2. This study shows sleep fragmentation and abnormal sleep stage percentages; these are nonspecific findings and per se do not signify an intrinsic sleep disorder or a cause for the patient's sleep-related symptoms. Causes include (but are not limited to) the first night effect of the sleep study, circadian rhythm disturbances, medication effect or an underlying mood disorder or medical problem.  3. The patient should be cautioned not to drive, work at heights, or operate dangerous or heavy equipment when tired or sleepy. Review and reiteration of good sleep hygiene measures should be pursued with any patient. 4. The patient will be seen in follow-up in the sleep clinic at [facility] for discussion of the test results, symptom and treatment compliance review, further management strategies, etc. The patient and her referring provider will be notified of the test results.   I certify that I have reviewed the entire raw data recording prior to the issuance of this report in accordance with the Standards of Accreditation of the American Academy of Sleep Medicine (AASM). Star Age, MD, PhD [facility]) [place], ABPN (Neurology and Sleep)              Technical Report:   General Information  Name: Loyola, Santino BMI: 29.29 Physician: Star Age, MD  ID: [id number] Height: 65.0 in Technician: Richard Miu, [place]  Sex: Female Weight: 176.0 lb Record: [id number]  Age: 6 [02-05-1976] Date: 03/23/2022    Medical & Medication History    46 year old right-handed woman with an underlying medical history of migraine headaches, seizure-like events, reflux disease, vitamin D deficiency, anxiety, depression, and overweight state, who reports snoring and excessive daytime somnolence as well as morning headaches. She does not wake up rested, sometimes she has nighttime headaches.  Nexium, Estrace, Pepcid, Prozac, Emgality, Lamictal, Provera,  Maxalt, Drisdol, Voltaren, Lexapro, Methylprednisolone, Lopressor, Nitrostat, Phenergan, Neurontin   Sleep Disorder      Comments   The patient came into the sleep lab for a PSG. One restroom trip. EKG kept in NSR. Mild to moderate snoring. All sleep stages observed. Respiratory events scored with a 3% desat. Slept lateral and supine. Lights on after the patient woke up after an arousal out of REM due to the patient requesting to be woken up at 5 am. She wanted to shower before she left because she was leaving here going to work.     Lights out: 09:25:32 PM Lights on: 05:04:32 AM   Time Total Supine Side Prone Upright  Recording (TRT) 7h 39.19m 0h 28.38m 7h 11.27m 0h 0.37m 0h 0.26m  Sleep (TST) 6h 19.70m 0h 0.19m 6h 19.58m 0h 0.34m 0h 0.26m   Latency N1 N2 N3 REM Onset Per. Slp. Eff.  Actual 0h 0.88m 0h 1.33m 0h 16.56m 2h 39.40m 0h 38.54m 0h 38.73m 82.68%   Stg Dur Wake N1 N2 N3 REM  Total 79.5 9.0 237.5 51.0 82.0  Supine 28.0 0.0 0.0 0.0 0.0  Side 51.5 9.0 237.5 51.0 82.0  Prone 0.0 0.0 0.0 0.0 0.0  Upright 0.0 0.0 0.0 0.0 0.0   Stg % Wake N1 N2 N3 REM  Total 17.3 2.4 62.6 13.4 21.6  Supine 6.1 0.0 0.0 0.0 0.0  Side 11.2 2.4 62.6 13.4 21.6  Prone 0.0 0.0 0.0 0.0 0.0  Upright 0.0 0.0 0.0 0.0 0.0     Apnea Summary Sub Supine Side Prone Upright  Total 0 Total 0 0 0 0 0    REM 0 0 0 0 0    NREM 0 0 0 0 0  Obs 0 REM 0 0 0 0 0    NREM 0 0 0 0 0  Mix 0 REM 0 0 0 0 0    NREM 0 0 0 0 0  Cen 0 REM 0 0 0 0 0    NREM 0 0 0 0 0   Rera Summary Sub Supine Side Prone Upright  Total 0 Total 0 0 0 0 0    REM 0 0 0 0 0    NREM 0 0 0 0 0   Hypopnea Summary Sub Supine Side Prone Upright  Total 97 Total 97 0 97 0 0    REM 16 0 16 0 0    NREM 81 0 81 0 0   4% Hypopnea Summary Sub Supine Side Prone Upright  Total (4%) 45 Total 45 0 45 0 0    REM 7 0 7 0 0    NREM 38 0 38 0 0     AHI Total Obs Mix Cen  15.34 Apnea 0.00 0.00 0.00 0.00   Hypopnea 15.34 -- -- --  7.11 Hypopnea (4%) 7.11 -- -- --     Total Supine Side Prone Upright  Position AHI 15.34 0.00 15.34 0.00 0.00  REM AHI 11.71   NREM AHI 16.34   Position RDI 15.34 0.00  15.34 0.00 0.00  REM RDI 11.71   NREM RDI 16.34    4% Hypopnea Total Supine Side Prone Upright  Position AHI (4%) 7.11 0.00 7.11 0.00 0.00  REM AHI (4%) 5.12   NREM AHI (4%) 7.66   Position RDI (4%) 7.11 0.00 7.11 0.00 0.00  REM RDI (4%) 5.12   NREM RDI (4%) 7.66    Desaturation Information Threshold: 2% <100% <90% <80% <70% <60% <50% <40%  Supine 14.0 0.0 0.0 0.0 0.0 0.0 0.0  Side 222.0 4.0 0.0 0.0 0.0 0.0 0.0  Prone 0.0 0.0 0.0 0.0 0.0 0.0 0.0  Upright 0.0 0.0 0.0 0.0 0.0 0.0 0.0  Total 236.0 4.0 0.0 0.0 0.0 0.0 0.0  Index 33.6 0.6 0.0 0.0 0.0 0.0 0.0   Threshold: 3% <100% <90% <80% <70% <60% <50% <40%  Supine 5.0 0.0 0.0 0.0 0.0 0.0 0.0  Side 101.0 4.0 0.0 0.0 0.0 0.0 0.0  Prone 0.0 0.0 0.0 0.0 0.0 0.0 0.0  Upright 0.0 0.0 0.0 0.0 0.0 0.0 0.0  Total 106.0 4.0 0.0 0.0 0.0 0.0 0.0  Index 15.1 0.6 0.0 0.0 0.0 0.0 0.0   Threshold: 4% <100% <90% <80% <70% <60% <50% <40%  Supine 0.0 0.0 0.0 0.0 0.0 0.0 0.0  Side 46.0 4.0 0.0 0.0 0.0 0.0 0.0  Prone 0.0 0.0 0.0 0.0 0.0 0.0 0.0  Upright 0.0 0.0 0.0 0.0 0.0 0.0 0.0  Total 46.0 4.0 0.0 0.0 0.0 0.0 0.0  Index 6.6 0.6 0.0 0.0 0.0 0.0 0.0   Threshold: 3% <100% <90% <80% <70% <60% <50% <40%  Supine 5 0 0 0 0 0 0  Side 101 4 0 0 0 0 0  Prone 0 0 0 0 0 0 0  Upright 0 0 0 0 0 0 0  Total 106 4 0 0 0 0 0   Awakening/Arousal Information # of Awakenings 16  Wake after sleep onset 41.42m  Wake after persistent sleep 41.40m   Arousal Assoc. Arousals Index  Apneas 0 0.0  Hypopneas 17 2.7  Leg Movements 0 0.0  Snore 0 0.0  PTT Arousals 0 0.0  Spontaneous 45 7.1  Total 62 9.8  Leg Movement Information PLMS LMs Index  Total LMs during PLMS 0 0.0  LMs w/ Microarousals 0 0.0   LM LMs Index  w/ Microarousal 0 0.0  w/ Awakening 0 0.0  w/ Resp Event 0 0.0  Spontaneous 6 0.9  Total 6 0.9      Desaturation threshold setting: 3% Minimum desaturation setting: 10 seconds SaO2 nadir: 70% The longest event was a 58 sec obstructive Hypopnea with a minimum SaO2 of 92%. The lowest SaO2 was 88% associated with a 20 sec obstructive Hypopnea. EKG Rates EKG Avg Max Min  Awake 71 93 62  Asleep 68 87 61  EKG Events: Tachycardia

## 2022-04-01 NOTE — Addendum Note (Signed)
Addended by: Star Age on: 04/01/2022 04:58 PM   Modules accepted: Orders

## 2022-04-02 ENCOUNTER — Telehealth: Payer: Self-pay | Admitting: Neurology

## 2022-04-02 NOTE — Telephone Encounter (Signed)
I called pt. I advised pt that Dr. Frances Furbish reviewed their sleep study results and found that pt has sleep apnea. Dr. Frances Furbish recommends that pt start auto CPAP. I reviewed PAP compliance expectations with the pt. Pt is agreeable to starting a CPAP. I advised pt that an order will be sent to a DME, [facility], and [facility] will call the pt within about one week after they file with the pt's insurance. [facility] will show the pt how to use the machine, fit for masks, and troubleshoot the CPAP if needed. A follow up appt was made for insurance purposes with Butch Penny, NP on 07/16/2022 at 3 pm. Pt verbalized understanding to arrive 15 minutes early and bring their CPAP. Pt verbalized understanding of results. Pt had no questions at this time but was encouraged to call back if questions arise. I have sent the order to [facility] and have received confirmation that they have received the order.

## 2022-04-02 NOTE — Telephone Encounter (Signed)
-----   Message from Huston Foley, MD sent at 04/01/2022  4:58 PM EST ----- Patient referred by MM, seen by me on 01/01/22, patient had a PSG on 03/23/22.    Please call and notify the patient that the recent sleep study showed obstructive sleep apnea in the moderate range. I recommend treatment in the form of autoPAP, which means, that we don't have to bring her in for a sleep study with CPAP, but will let her start using a so called autoPAP machine at home, which is a CPAP-like machine with self-adjusting pressures. We will send the order to a local DME company (of her choice, or as per insurance requirement). The DME representative will fit her with a mask, educate her on how to use the machine, how to put the mask on, etc. I have placed an order in the chart. Please send the order, talk to patient, send report to referring MD. We will need a FU in sleep clinic for 10 weeks post-PAP set up, please arrange that with me or one of our NPs. Also reinforce the need for compliance with treatment. Thanks,   Huston Foley, MD, PhD [facility])

## 2022-04-10 ENCOUNTER — Other Ambulatory Visit: Payer: Self-pay | Admitting: Adult Health

## 2022-04-10 DIAGNOSIS — G43709 Chronic migraine without aura, not intractable, without status migrainosus: Secondary | ICD-10-CM

## 2022-04-15 NOTE — Telephone Encounter (Signed)
yes

## 2022-04-15 NOTE — Telephone Encounter (Signed)
Emgality auto-injector Rx switched to the syringe 120 mg, same instructions, per v.o. Megan NP.

## 2022-04-16 NOTE — Telephone Encounter (Signed)
Received a PA. Attempted to complete on Cover My Meds but received this message from plan: Your PA has been resolved, no additional PA is required. For further inquiries please contact the number on the back of the member prescription card. (Message [phone])  I faxed a response back to the pharmacy asking them to try to run the syringe as no PA is needed per plan. Key: [id number].

## 2022-05-22 ENCOUNTER — Encounter: Payer: Self-pay | Admitting: Adult Health

## 2022-05-22 DIAGNOSIS — G4733 Obstructive sleep apnea (adult) (pediatric): Secondary | ICD-10-CM

## 2022-05-27 NOTE — Telephone Encounter (Signed)
Order written to decrease autopap pressure to 6-10. Change has been made in [facility] portal and should take effect next time patient turns her machine on.

## 2022-05-27 NOTE — Addendum Note (Signed)
Addended by: Gildardo Griffes on: 05/27/2022 03:57 PM   Modules accepted: Orders

## 2022-05-27 NOTE — Telephone Encounter (Signed)
Order faxed to [facility]. Received a receipt of confirmation.  

## 2022-05-27 NOTE — Telephone Encounter (Signed)
We can decrease pressure 6-10 and see if that helps

## 2022-05-29 ENCOUNTER — Telehealth: Payer: Self-pay | Admitting: *Deleted

## 2022-05-29 NOTE — Telephone Encounter (Signed)
Called pharmacy and PA needed for [employer].  Clarified the [facility] [id number] bin [phone] PCN ADV.  New ID.  Initiated PA for [employer] on CMM Nyashia Dyckman (Key: [id number]).  Determination pending.

## 2022-05-29 NOTE — Telephone Encounter (Addendum)
Received fax [facility] approval for Emgality 120mg / ml pen.  05-30-2023 thru 05-30-2023.  PA# [id number].   Faxed approval to [place]. [id number]

## 2022-07-15 ENCOUNTER — Encounter: Payer: Self-pay | Admitting: *Deleted

## 2022-07-15 NOTE — Progress Notes (Unsigned)
PATIENT: Julie Bennett DOB: 06/22/1975  REASON FOR VISIT: follow up HISTORY FROM: patient PRIMARY NEUROLOGIST: Dr. Krista Blue   Virtual Visit via Video Note  I connected with Julie Bennett on 07/15/22 at  1:30 PM EST by a video enabled telemedicine application located remotely at [facility] and verified that I am speaking with the correct person using two identifiers who was located at their own home.   I discussed the limitations of evaluation and management by telemedicine and the availability of in person appointments. The patient expressed understanding and agreed to proceed.   PATIENT: Julie Bennett DOB: [birth year]  REASON FOR VISIT: follow up HISTORY FROM: patient  HISTORY OF PRESENT ILLNESS: Today 07/15/22:  Julie Bennett is a 47 y.o. female with a history of OSA on CPAP and Migraines. Returns today for follow-up.  She reports that she tried using the CPAP consistently for 30 days.  She states that she is unable to use it.  She feels that she sleeps better when she does not use the CPAP machine.  She reports that she recently had a breast biopsy and looking at the results on MyChart she knows that it is cancerous but she goes tomorrow to discuss plan of care.  At this time she does not want to restart CPAP therapy.  She will let us know if she wants to discuss alternative treatment options such as a dental device.  In regards to her headaches she has been using Emgality for prevention and Ubrelvy for abortive therapy.  She states that the last Emgality was not the autoinjector and it made her leg sore.  She states that her headache frequency has increased but she relates this to possible stress.  She has not had Emgality in 6 weeks.       REVIEW OF SYSTEMS: Out of a complete 14 system review of symptoms, the patient complains only of the following symptoms, and all other reviewed systems are negative.  ALLERGIES: Allergies  Allergen  Reactions   Plecanatide Diarrhea    Other reaction(s): diarrhea   Zonisamide Other (See Comments)    Side effects of dizziness, blurred vision.    HOME MEDICATIONS: Outpatient Medications Prior to Visit  Medication Sig Dispense Refill   diclofenac (VOLTAREN) 75 MG EC tablet Take 1 tablet (75 mg total) by mouth 2 (two) times daily. (Patient not taking: Reported on 10/27/2021) 50 tablet 2   escitalopram (LEXAPRO) 10 MG tablet Take 10 mg by mouth daily.     escitalopram (LEXAPRO) 20 MG tablet Take 20 mg by mouth daily. Takes with 10 mg for total of 30 mg per day     esomeprazole (NEXIUM) 40 MG capsule Take 40 mg by mouth daily.     estradiol (ESTRACE) 2 MG tablet Take 2 mg by mouth daily.     famotidine (PEPCID) 10 MG tablet Take 1 tablet (10 mg total) by mouth at bedtime. 30 tablet 0   FLUoxetine (PROZAC) 20 MG capsule Take 20 mg by mouth daily.     gabapentin (NEURONTIN) 100 MG capsule Take 100 mg by mouth 3 (three) times daily.     Galcanezumab-gnlm (EMGALITY) 120 MG/ML SOSY Inject 120 mg into the skin every 30 (thirty) days. 1 mL 1   lamoTRIgine (LAMICTAL) 100 MG tablet Take 1 tablet (100 mg total) by mouth 2 (two) times daily. 60 tablet 11   medroxyPROGESTERone (PROVERA) 5 MG tablet Take 5 mg by mouth daily.  methylPREDNISolone (MEDROL DOSEPAK) 4 MG TBPK tablet Take as directed by packaging 1 each 0   metoprolol tartrate (LOPRESSOR) 25 MG tablet TAKE 1 TABLET BY MOUTH TWICE A DAY START 2 DAYS BEORE CT SCAN (Patient not taking: Reported on 12/02/2021) 10 tablet 1   nitroGLYCERIN (NITROSTAT) 0.4 MG SL tablet Place 1 tablet (0.4 mg total) under the tongue every 5 (five) minutes as needed for chest pain. (Patient not taking: Reported on 12/02/2021) 25 tablet 1   promethazine (PHENERGAN) 12.5 MG tablet Take 12.5 mg by mouth every 6 (six) hours as needed for nausea or vomiting.     Ubrogepant (UBRELVY) 100 MG TABS Take 1 tablet at the onset of migraine. Repeat in 2 hours if needed. Only 2 tabs in  24 hours. 15 tablet 11   Vitamin D, Ergocalciferol, (DRISDOL) 1.25 MG (50000 UNIT) CAPS capsule Take 1 capsule by mouth once a week. Take on Saturdays     No facility-administered medications prior to visit.    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   Seizure ([place])    Seizures ([place])     PAST SURGICAL HISTORY: Past Surgical History:  Procedure Laterality Date   DILATION AND CURETTAGE OF UTERUS     hsyteroscopy     LAPAROSCOPY     TUBAL LIGATION      FAMILY HISTORY: Family History  Problem Relation Age of Onset   Ovarian cancer Mother    Hypertension Father    Throat cancer Father    CAD Brother    Diabetes Brother    Sleep apnea Neg Hx     SOCIAL HISTORY: Social History   Socioeconomic History   Marital status: Married    Spouse name: Not on file   Number of children: Not on file   Years of education: Not on file   Highest education level: Not on file  Occupational History   Not on file  Tobacco Use   Smoking status: Never   Smokeless tobacco: Never  Vaping Use   Vaping Use: Never used  Substance and Sexual Activity   Alcohol use: No   Drug use: No   Sexual activity: Yes    Birth control/protection: None  Other Topics Concern   Not on file  Social History Narrative   Lives at home husband and kids   Right handed   Caffeine: tea multiple small cups per day    Social Determinants of Health   Financial Resource Strain: Not on file  Food Insecurity: Not on file  Transportation Needs: Not on file  Physical Activity: Not on file  Stress: Not on file  Social Connections: Not on file  Intimate Partner Violence: Not on file      PHYSICAL EXAM Generalized: Well developed, in no acute distress   Neurological examination  Mentation: Alert oriented to time, place, history taking. Follows all commands speech and language  fluent Cranial nerve II-XII:Extraocular movements were full. Facial symmetry noted. uvula tongue midline. Head turning and shoulder shrug  were normal and symmetric. Motor: Good strength throughout subjectively per patient Sensory: Sensory testing is intact to soft touch on all 4 extremities subjectively per patient Coordination: Cerebellar testing reveals good finger-nose-finger  Gait and station: Patient is able to stand from a seated position. gait is normal.  Reflexes: UTA  DIAGNOSTIC DATA (LABS, IMAGING, TESTING) -  I reviewed patient records, labs, notes, testing and imaging myself where available.  Lab Results  Component Value Date   WBC 7.9 05/29/2021   HGB 13.1 05/29/2021   HCT 39.2 05/29/2021   MCV 90.3 05/29/2021   PLT 276 05/29/2021      Component Value Date/Time   NA 137 05/29/2021 0831   K 3.9 05/29/2021 0831   CL 107 05/29/2021 0831   CO2 23 05/29/2021 0831   GLUCOSE 91 05/29/2021 0831   BUN 13 05/29/2021 0831   CREATININE 1.15 (H) 05/29/2021 0831   CALCIUM 9.1 05/29/2021 0831   PROT 7.1 04/14/2021 1951   ALBUMIN 3.8 04/14/2021 1951   AST 35 04/14/2021 1951   ALT 21 04/14/2021 1951   ALKPHOS 99 04/14/2021 1951   BILITOT 0.4 04/14/2021 1951   GFRNONAA 60 (L) 05/29/2021 0831   GFRAA >60 05/17/2018 0856       ASSESSMENT AND PLAN 47 y.o. year old female  has a past medical history of Amenorrhea, Anxiety, Depression, Headache, Migraine, Refractory migraine (08/20/2016), Seizure ([place]), and Seizures ([place]). here with:  OSA on CPAP  Discussed risk associated with untreated sleep apnea.  Also discussed alternative treatment options such as dental device.  Patient will let us know if she wants to proceed with alternative treatment  Migraine headaches  Restart Emgality 120 mg --autoinjector was sent in. Continue Ubrelvy for abortive therapy  Follow-up in 6 months or sooner if needed   Ward Givens, MSN, NP-C 07/15/2022, 5:19 PM [facility] [address] [place], [place] [address] [phone]

## 2022-07-16 ENCOUNTER — Telehealth (INDEPENDENT_AMBULATORY_CARE_PROVIDER_SITE_OTHER): Payer: BC Managed Care – PPO | Admitting: Adult Health

## 2022-07-16 ENCOUNTER — Ambulatory Visit: Payer: BC Managed Care – PPO | Admitting: Adult Health

## 2022-07-16 DIAGNOSIS — G43709 Chronic migraine without aura, not intractable, without status migrainosus: Secondary | ICD-10-CM

## 2022-07-16 DIAGNOSIS — G4733 Obstructive sleep apnea (adult) (pediatric): Secondary | ICD-10-CM | POA: Diagnosis not present

## 2022-07-16 MED ORDER — EMGALITY 120 MG/ML ~~LOC~~ SOAJ: 120.0000 mg | SUBCUTANEOUS | 5 refills | Status: DC

## 2022-07-20 ENCOUNTER — Encounter: Payer: Self-pay | Admitting: Adult Health

## 2022-08-07 ENCOUNTER — Encounter: Payer: Self-pay | Admitting: Adult Health

## 2023-02-16 ENCOUNTER — Encounter: Payer: Self-pay | Admitting: *Deleted

## 2023-02-16 NOTE — Progress Notes (Deleted)
PATIENT: Julie Bennett DOB: February 22, 1976  REASON FOR VISIT: follow up HISTORY FROM: patient PRIMARY NEUROLOGIST:   HISTORY OF PRESENT ILLNESS: Today 02/16/23  HISTORY  07/15/22:   Julie Bennett is a 47 y.o. female with a history of OSA on CPAP and Migraines. Returns today for follow-up.  She reports that she tried using the CPAP consistently for 30 days.  She states that she is unable to use it.  She feels that she sleeps better when she does not use the CPAP machine.  She reports that she recently had a breast biopsy and looking at the results on MyChart she knows that it is cancerous but she goes tomorrow to discuss plan of care.  At this time she does not want to restart CPAP therapy.  She will let us know if she wants to discuss alternative treatment options such as a dental device.   In regards to her headaches she has been using Emgality for prevention and Ubrelvy for abortive therapy.  She states that the last Emgality was not the autoinjector and it made her leg sore.  She states that her headache frequency has increased but she relates this to possible stress.  She has not had Emgality in 6 weeks.           REVIEW OF SYSTEMS: Out of a complete 14 system review of symptoms, the patient complains only of the following symptoms, and all other reviewed systems are negative.  FSS ESS  ALLERGIES: Allergies  Allergen Reactions   Plecanatide Diarrhea    Other reaction(s): diarrhea   Zonisamide Other (See Comments)    Side effects of dizziness, blurred vision.    HOME MEDICATIONS: Outpatient Medications Prior to Visit  Medication Sig Dispense Refill   diclofenac (VOLTAREN) 75 MG EC tablet Take 1 tablet (75 mg total) by mouth 2 (two) times daily. (Patient not taking: Reported on 10/27/2021) 50 tablet 2   escitalopram (LEXAPRO) 10 MG tablet Take 10 mg by mouth daily.     escitalopram (LEXAPRO) 20 MG tablet Take 20 mg by mouth daily. Takes with 10 mg for total of 30 mg  per day     esomeprazole (NEXIUM) 40 MG capsule Take 40 mg by mouth daily.     estradiol (ESTRACE) 2 MG tablet Take 2 mg by mouth daily.     famotidine (PEPCID) 10 MG tablet Take 1 tablet (10 mg total) by mouth at bedtime. 30 tablet 0   FLUoxetine (PROZAC) 20 MG capsule Take 20 mg by mouth daily.     gabapentin (NEURONTIN) 100 MG capsule Take 100 mg by mouth 3 (three) times daily.     Galcanezumab-gnlm (EMGALITY) 120 MG/ML SOAJ Inject 120 mg into the skin every 30 (thirty) days. 1.12 mL 5   lamoTRIgine (LAMICTAL) 100 MG tablet Take 1 tablet (100 mg total) by mouth 2 (two) times daily. 60 tablet 11   medroxyPROGESTERone (PROVERA) 5 MG tablet Take 5 mg by mouth daily.     methylPREDNISolone (MEDROL DOSEPAK) 4 MG TBPK tablet Take as directed by packaging 1 each 0   metoprolol tartrate (LOPRESSOR) 25 MG tablet TAKE 1 TABLET BY MOUTH TWICE A DAY START 2 DAYS [facility] CT SCAN (Patient not taking: Reported on 12/02/2021) 10 tablet 1   nitroGLYCERIN (NITROSTAT) 0.4 MG SL tablet Place 1 tablet (0.4 mg total) under the tongue every 5 (five) minutes as needed for chest pain. (Patient not taking: Reported on 12/02/2021) 25 tablet 1   promethazine (PHENERGAN)  12.5 MG tablet Take 12.5 mg by mouth every 6 (six) hours as needed for nausea or vomiting.     Ubrogepant (UBRELVY) 100 MG TABS Take 1 tablet at the onset of migraine. Repeat in 2 hours if needed. Only 2 tabs in 24 hours. 15 tablet 11   Vitamin D, Ergocalciferol, (DRISDOL) 1.25 MG (50000 UNIT) CAPS capsule Take 1 capsule by mouth once a week. Take on Saturdays     No facility-administered medications prior to visit.    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   Seizure ([facility])    Seizures ([facility])     PAST SURGICAL HISTORY: Past Surgical History:  Procedure Laterality Date   DILATION AND CURETTAGE OF UTERUS     hsyteroscopy      LAPAROSCOPY     TUBAL LIGATION      FAMILY HISTORY: Family History  Problem Relation Age of Onset   Ovarian cancer Mother    Hypertension Father    Throat cancer Father    CAD Brother    Diabetes Brother    Sleep apnea Neg Hx     SOCIAL HISTORY: Social History   Socioeconomic History   Marital status: Married    Spouse name: Not on file   Number of children: Not on file   Years of education: Not on file   Highest education level: Not on file  Occupational History   Not on file  Tobacco Use   Smoking status: Never   Smokeless tobacco: Never  Vaping Use   Vaping status: Never Used  Substance and Sexual Activity   Alcohol use: No   Drug use: No   Sexual activity: Yes    Birth control/protection: None  Other Topics Concern   Not on file  Social History Narrative   Lives at home husband and kids   Right handed   Caffeine: tea multiple small cups per day    Social Determinants of Health   Financial Resource Strain: Low Risk  (07/20/2022)   Received from [facility]   Overall Financial Resource Strain (CARDIA)    Difficulty of Paying Living Expenses: Not hard at all  Food Insecurity: No Food Insecurity (11/29/2022)   Received from [facility]   Hunger Vital Sign    Worried About Running Out of Food in the Last Year: Never true    Ran Out of Food in the Last Year: Never true  Transportation Needs: No Transportation Needs (11/29/2022)   Received from [facility] - Transportation    Lack of Transportation (Medical): No    Lack of Transportation (Non-Medical): No  Physical Activity: Unknown (07/20/2022)   Received from [facility]   Exercise Vital Sign    Days of Exercise per Week: Patient declined    Minutes of Exercise per Session: 0 min  Stress: No Stress Concern Present (11/29/2022)   Received from [facility] - Occupational Stress Questionnaire    Feeling of Stress : Only  a little  Social Connections: Socially Integrated (07/20/2022)   Received from [facility]   Social Network    How would you rate your social network (family, work, friends)?: Good participation with social networks  Intimate Partner Violence: Not At Risk (01/28/2023)   Received from [facility]  HITS    Over the last 12 months how often did your partner physically hurt you?: 1    Over the last 12 months how often did your partner insult you or talk down to you?: 1    Over the last 12 months how often did your partner threaten you with physical harm?: 1    Over the last 12 months how often did your partner scream or curse at you?: 1      PHYSICAL EXAM  There were no vitals filed for this visit. There is no height or weight on file to calculate BMI.  Generalized: Well developed, in no acute distress  Chest: Lungs clear to auscultation bilaterally  Neurological examination  Mentation: Alert oriented to time, place, history taking. Follows all commands speech and language fluent Cranial nerve II-XII: Extraocular movements were full, visual field were full on confrontational test Head turning and shoulder shrug  were normal and symmetric. Motor: The motor testing reveals 5 over 5 strength of all 4 extremities. Good symmetric motor tone is noted throughout.  Sensory: Sensory testing is intact to soft touch on all 4 extremities. No evidence of extinction is noted.  Gait and station: Gait is normal.    DIAGNOSTIC DATA (LABS, IMAGING, TESTING) - I reviewed patient records, labs, notes, testing and imaging myself where available.  Lab Results  Component Value Date   WBC 7.9 05/29/2021   HGB 13.1 05/29/2021   HCT 39.2 05/29/2021   MCV 90.3 05/29/2021   PLT 276 05/29/2021      Component Value Date/Time   NA 137 05/29/2021 0831   K 3.9 05/29/2021 0831   CL 107 05/29/2021 0831   CO2 23 05/29/2021 0831   GLUCOSE 91 05/29/2021 0831   BUN 13 05/29/2021 0831    CREATININE 1.15 (H) 05/29/2021 0831   CALCIUM 9.1 05/29/2021 0831   PROT 7.1 04/14/2021 1951   ALBUMIN 3.8 04/14/2021 1951   AST 35 04/14/2021 1951   ALT 21 04/14/2021 1951   ALKPHOS 99 04/14/2021 1951   BILITOT 0.4 04/14/2021 1951   GFRNONAA 60 (L) 05/29/2021 0831   GFRAA >60 05/17/2018 0856   No results found for: "CHOL", "HDL", "LDLCALC", "LDLDIRECT", "TRIG", "CHOLHDL" No results found for: "HGBA1C" No results found for: "VITAMINB12" No results found for: "TSH"    ASSESSMENT AND PLAN 47 y.o. 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 here with:  OSA on CPAP  - CPAP compliance excellent - Good treatment of AHI  - Encourage patient to use CPAP nightly and > 4 hours each night - F/U in 1 year or sooner if needed   I spent *** minutes of face-to-face and non-face-to-face time with patient.  This included previsit chart review, lab review, study review, order entry, electronic health record documentation, patient education.  Butch Penny, MSN, NP-C 02/16/2023, 4:07 PM [facility] [address], [address] [place], [place] [address] [phone]

## 2023-02-17 ENCOUNTER — Ambulatory Visit: Payer: BC Managed Care – PPO | Admitting: Adult Health

## 2023-02-17 NOTE — Telephone Encounter (Signed)
Noted  

## 2023-04-09 ENCOUNTER — Telehealth: Payer: Self-pay

## 2023-04-09 NOTE — Telephone Encounter (Signed)
*  [facility]  Pharmacy Patient Advocate Encounter   Received notification from CoverMyMeds that prior authorization for Emgality 120MG /ML auto-injectors (migraine)  is required/requested.   Insurance verification completed.   The patient is insured through [employer] [facility] .   Per test claim: PA required; PA submitted to above mentioned insurance via CoverMyMeds Key/confirmation #/EOC [id number] Status is pending

## 2023-04-13 MED ORDER — EMGALITY 120 MG/ML ~~LOC~~ SOAJ: 120.0000 mg | SUBCUTANEOUS | 2 refills | Status: AC

## 2023-06-16 ENCOUNTER — Emergency Department (HOSPITAL_COMMUNITY)
Admission: EM | Admit: 2023-06-16 | Discharge: 2023-06-16 | Disposition: A | Discharge status: Home / Self Care | Payer: 59 | Attending: Emergency Medicine | Admitting: Emergency Medicine

## 2023-06-16 ENCOUNTER — Emergency Department (HOSPITAL_COMMUNITY): Payer: 59

## 2023-06-16 ENCOUNTER — Encounter (HOSPITAL_COMMUNITY): Payer: Self-pay | Admitting: Emergency Medicine

## 2023-06-16 ENCOUNTER — Other Ambulatory Visit: Payer: Self-pay

## 2023-06-16 DIAGNOSIS — S93401A Sprain of unspecified ligament of right ankle, initial encounter: Secondary | ICD-10-CM | POA: Insufficient documentation

## 2023-06-16 DIAGNOSIS — S92211A Displaced fracture of cuboid bone of right foot, initial encounter for closed fracture: Secondary | ICD-10-CM | POA: Diagnosis not present

## 2023-06-16 DIAGNOSIS — W108XXA Fall (on) (from) other stairs and steps, initial encounter: Secondary | ICD-10-CM | POA: Diagnosis not present

## 2023-06-16 DIAGNOSIS — M25571 Pain in right ankle and joints of right foot: Secondary | ICD-10-CM | POA: Diagnosis present

## 2023-06-16 HISTORY — DX: Malignant neoplasm of unspecified site of unspecified female breast: C50.919

## 2023-06-16 MED ORDER — OXYCODONE HCL 5 MG PO TABS: 5.0000 mg | ORAL_TABLET | ORAL | 0 refills | Status: AC | PRN

## 2023-06-16 MED ORDER — ONDANSETRON 4 MG PO TBDP
4.0000 mg | ORAL_TABLET | Freq: Once | ORAL | Status: AC
  Administered 2023-06-16: 4 mg via ORAL
  Filled 2023-06-16: qty 1

## 2023-06-16 MED ORDER — HYDROCODONE-ACETAMINOPHEN 5-325 MG PO TABS
1.0000 | ORAL_TABLET | Freq: Once | ORAL | Status: AC
  Administered 2023-06-16: 1 via ORAL
  Filled 2023-06-16: qty 1

## 2023-06-16 NOTE — Discharge Instructions (Addendum)
You have 2 fractures in the right foot.  Do not bear weight on this foot until follow-up with orthopedist.  If you have any concerning symptoms return to the emergency room.  Ice the area 4-5 times a day for about 15 minutes each time.  Take ibuprofen 600-800 mg every 8 hours, take 1000 mg of Tylenol every 8 hours.  Reserve the pain medication for severe breakthrough pain.

## 2023-06-16 NOTE — ED Triage Notes (Signed)
Pt. Stated, I fell down 3 steps this morning cause Im just clumsey. Rt. Ankle pain

## 2023-06-16 NOTE — Progress Notes (Signed)
Orthopedic Tech Progress Note Patient Details:  Julie Bennett [birth year] [id number]  Ortho Devices Type of Ortho Device: CAM walker, Crutches Ortho Device/Splint Location: RLE Ortho Device/Splint Interventions: Ordered, Application, Adjustment   Post Interventions Patient Tolerated: Well, Ambulated well Instructions Provided: Care of device  Donald Pore 06/16/2023, 10:52 AM

## 2023-06-16 NOTE — ED Provider Notes (Signed)
[facility] Provider Note   CSN: [id number] Arrival date & time: 06/16/23  1660     History  Chief Complaint  Patient presents with   Ankle Pain    Julie Bennett is a 48 y.o. female.  48 year old female presents today for concern of falling down a few steps in front of her house and now having right ankle pain and swelling.  She states she did hear a cracking noise.  Denies head injury or loss of consciousness.  No other complaint.  The history is provided by the patient. No language interpreter was used.       Home Medications Prior to Admission medications   Medication Sig Start Date End Date Taking? Authorizing Provider  diazepam (VALIUM) 5 MG tablet Take 5 mg by mouth every 8 (eight) hours. 05/12/23  Yes [provider]  baclofen (LIORESAL) 10 MG tablet Take 10 mg by mouth 3 (three) times daily as needed for muscle spasms. 02/10/23   [provider]  esomeprazole (NEXIUM) 40 MG capsule Take 40 mg by mouth daily. 05/28/21   [provider]  estradiol (ESTRACE) 2 MG tablet Take 2 mg by mouth daily. 07/28/20   [provider]  famotidine (PEPCID) 10 MG tablet Take 1 tablet (10 mg total) by mouth at bedtime. 09/29/20   Evlyn Kanner, MD  FLUoxetine (PROZAC) 20 MG capsule Take 20 mg by mouth daily. 12/30/21   [provider]  FLUoxetine (PROZAC) 40 MG capsule Take 40 mg by mouth daily. Along with 20mg  capsule for a total daily dose of 60mg . 03/21/23   [provider]  Galcanezumab-gnlm (EMGALITY) 120 MG/ML SOAJ Inject 120 mg into the skin every 30 (thirty) days. 04/13/23   Butch Penny, NP  ibuprofen (ADVIL) 800 MG tablet Take 800 mg by mouth 3 (three) times daily. 05/12/23   [provider]  lamoTRIgine (LAMICTAL) 100 MG tablet Take 1 tablet (100 mg total) by mouth 2 (two) times daily. 06/24/21   Levert Feinstein, MD  medroxyPROGESTERone (PROVERA) 5 MG tablet Take 5 mg by mouth  daily. 03/11/19   [provider]  OLANZapine (ZYPREXA) 5 MG tablet Take 5 mg by mouth at bedtime. 03/21/23   [provider]  oxyCODONE (OXY IR/ROXICODONE) 5 MG immediate release tablet Take 5 mg by mouth every 6 (six) hours as needed. 05/12/23   [provider]  pregabalin (LYRICA) 50 MG capsule Take 50 mg by mouth 3 (three) times daily. 02/17/23   [provider]  silver sulfADIAZINE (SILVADENE) 1 % cream Apply 1 Application topically daily.    [provider]  Ubrogepant (UBRELVY) 100 MG TABS Take 1 tablet at the onset of migraine. Repeat in 2 hours if needed. Only 2 tabs in 24 hours. 03/05/22   Butch Penny, NP  Vitamin D, Ergocalciferol, (DRISDOL) 1.25 MG (50000 UNIT) CAPS capsule Take 1 capsule by mouth once a week. Take on Saturdays 07/12/20   [provider]      Allergies    Plecanatide and Zonisamide    Review of Systems   Review of Systems  Constitutional:  Negative for chills and fever.  Musculoskeletal:  Positive for arthralgias and joint swelling.  All other systems reviewed and are negative.   Physical Exam Updated Vital Signs BP 125/71 (BP Location: Right Arm)   Pulse 88   Temp 98.6 F (37 C) (Oral)   Resp 16   Ht 5\' 5"  (1.651 m)   Wt  70.3 kg   LMP 08/14/2013 Comment: signed preg waiver 6.26.2020  SpO2 98%   BMI 25.79 kg/m  Physical Exam Vitals and nursing note reviewed.  Constitutional:      General: She is not in acute distress.    Appearance: Normal appearance. She is not ill-appearing.  HENT:     Head: Normocephalic and atraumatic.     Nose: Nose normal.  Eyes:     Conjunctiva/sclera: Conjunctivae normal.  Pulmonary:     Effort: Pulmonary effort is normal. No respiratory distress.  Musculoskeletal:        General: No deformity.     Comments: Obvious swelling to the right ankle.  Significant tenderness to palpation to the right ankle.  Range of motion limited secondary to pain.  Neurovascularly  intact in the right foot.  Tib-fib without tenderness palpation.  Right ankle without tenderness palpation and has good range of motion.   Skin:    Findings: No rash.  Neurological:     Mental Status: She is alert.     ED Results / Procedures / Treatments   Labs (all labs ordered are listed, but only abnormal results are displayed) Labs Reviewed - No data to display  EKG None  Radiology DG Foot Complete Right Result Date: 06/16/2023 CLINICAL DATA:  Larey Seat down 3 steps today. Lateral and posterior right ankle and foot pain. EXAM: RIGHT FOOT COMPLETE - 3+ VIEW COMPARISON:  Right ankle radiographs 06/16/2023 and 09/25/2021 FINDINGS: There is lucency and mild bone separation at the posterolateral aspect of the cuboid with up to 2 mm diastasis indicating an acute fracture. Subtle curvilinear lucency within the adjacent distal lateral aspect of the anterior process of the calcaneus. Mild hallux valgus with great toe metatarsophalangeal angle measuring 22 degrees. Joint spaces are preserved. IMPRESSION: Acute mildly displaced fracture of the posterolateral aspect of the cuboid. Tiny nondisplaced acute fracture within the adjacent distal lateral anterior process of the calcaneus. This may represent an avulsion fracture at the dorsal calcaneocuboid ligament and/or bifurcate ligament insertions. Electronically Signed   By: Neita Garnet M.D.   On: 06/16/2023 09:27   DG Ankle Complete Right Result Date: 06/16/2023 CLINICAL DATA:  Lateral ankle pain after falling down 3 steps today. EXAM: RIGHT ANKLE - COMPLETE 3+ VIEW COMPARISON:  Radiographs 09/25/2021 FINDINGS: On the AP view, there is possible lateral irregularity of the calcaneus, not well seen on the additional views. There is no evidence of acute fracture or dislocation at the ankle. The joint spaces are preserved. No foreign body or focal soft tissue swelling identified. IMPRESSION: 1. Possible avulsion fracture of the calcaneus laterally. Correlate  with point tenderness. Dedicated foot radiographs should be considered. 2. No evidence of acute fracture or dislocation at the ankle. Electronically Signed   By: Carey Bullocks M.D.   On: 06/16/2023 08:33    Procedures Procedures    Medications Ordered in ED Medications  ondansetron (ZOFRAN-ODT) disintegrating tablet 4 mg (4 mg Oral Given 06/16/23 0823)  HYDROcodone-acetaminophen (NORCO/VICODIN) 5-325 MG per tablet 1 tablet (1 tablet Oral Given 06/16/23 0960)    ED Course/ Medical Decision Making/ A&P                                 Medical Decision Making Amount and/or Complexity of Data Reviewed Radiology: ordered.  Risk Prescription drug management.   Medical Decision Making / ED Course   This patient presents to the ED for concern  of fall, this involves an extensive number of treatment options, and is a complaint that carries with it a high risk of complications and morbidity.  The differential diagnosis includes ankle sprain, fracture, dislocation  MDM: 48 year old female presents today for concern of fall down a few steps.  She states that she is "clumsy" and slipped and fell.  She states she had a backpack on her right shoulder which might of had something to do with her being imbalanced.  No syncopal episode.  No head injury.  Right ankle x-ray shows an avulsion fracture of the calcaneus.  Dedicated right foot x-ray obtained.  Shows the avulsion calcaneal fracture as well as a fracture of the cuboid.  Cam boot provided.  Crutches provided.  Orthopedic referral given.  Short course of pain medication given.  Symptomatic management discussed.  Return precaution discussed.  Patient voices understanding and is in agreement with plan.   Lab Tests: -I ordered, reviewed, and interpreted labs.   The pertinent results include:   Labs Reviewed - No data to display    EKG  EKG Interpretation Date/Time:    Ventricular Rate:    PR Interval:    QRS Duration:    QT Interval:     QTC Calculation:   R Axis:      Text Interpretation:           Imaging Studies ordered: I ordered imaging studies including right ankle, right foot I independently visualized and interpreted imaging. I agree with the radiologist interpretation   Medicines ordered and prescription drug management: Meds ordered this encounter  Medications   ondansetron (ZOFRAN-ODT) disintegrating tablet 4 mg   HYDROcodone-acetaminophen (NORCO/VICODIN) 5-325 MG per tablet 1 tablet    Refill:  0    -I have reviewed the patients home medicines and have made adjustments as needed   Reevaluation: After the interventions noted above, I reevaluated the patient and found that they have :improved  Co morbidities that complicate the patient evaluation  Past Medical History:  Diagnosis Date   Amenorrhea    Anxiety    Breast cancer (HCC)    Depression    Headache    Migraine    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   Seizure (HCC)    Seizures (HCC)       Dispostion: Discharged in stable condition.  Return precaution discussed.  Patient voices understanding and is in agreement with the plan.   Final Clinical Impression(s) / ED Diagnoses Final diagnoses:  Displaced fracture of cuboid bone of right foot, initial encounter for closed fracture  Sprain of right ankle, unspecified ligament, initial encounter    Rx / DC Orders ED Discharge Orders          Ordered    oxyCODONE (ROXICODONE) 5 MG immediate release tablet  Every 4 hours PRN        06/16/23 1028              Marita Kansas, PA-C 06/16/23 1028    Ernie Avena, MD 06/16/23 1818

## 2023-07-16 ENCOUNTER — Other Ambulatory Visit (HOSPITAL_COMMUNITY): Payer: Self-pay

## 2023-10-01 ENCOUNTER — Telehealth: Payer: Self-pay | Admitting: Adult Health

## 2023-10-01 NOTE — Telephone Encounter (Signed)
 Appointment made for morning exhaustion

## 2023-12-08 ENCOUNTER — Ambulatory Visit (INDEPENDENT_AMBULATORY_CARE_PROVIDER_SITE_OTHER): Admitting: Neurology

## 2023-12-08 ENCOUNTER — Encounter: Payer: Self-pay | Admitting: Neurology

## 2023-12-08 VITALS — BP 106/70 | HR 71 | Ht 65.0 in | Wt 174.0 lb

## 2023-12-08 DIAGNOSIS — G4733 Obstructive sleep apnea (adult) (pediatric): Secondary | ICD-10-CM

## 2023-12-08 DIAGNOSIS — G4719 Other hypersomnia: Secondary | ICD-10-CM | POA: Diagnosis not present

## 2023-12-08 DIAGNOSIS — Z789 Other specified health status: Secondary | ICD-10-CM | POA: Diagnosis not present

## 2023-12-08 NOTE — Patient Instructions (Signed)
 I recommend you restart your autoPAP.  I will order a mask refit through [facility]. You can call them today to make an appointment.

## 2023-12-08 NOTE — Progress Notes (Signed)
 Subjective:    Patient ID: Julie Bennett is a 48 y.o. female.  HPI    Interim history:   Julie Bennett is a 48 year old right-handed woman with an underlying medical history of breast cancer, status post bilateral mastectomy, migraine headaches, seizure-like events, reflux disease, vitamin D deficiency, anxiety, depression, and overweight state, who presents for follow-up consultation of her obstructive sleep apnea.  She had seen Duwaine Russell, NP in a video visit in February 2024, at which time she was no longer using her PAP machine.  Today, 12/08/2023: She reports feeling exhausted.  She has not used her AutoPap much, she had about 16 days of usage between March and June 2025.  She reports that her mask is not fitting well.  She has not been in touch with her DME company about this.  Her Epworth sleepiness score is 14 out of 24.  She is on Abilify at night and Ambien at night, 5 mg strength and she takes 60 mg of Prozac during the day.  Bedtime is generally between 8 and 9 and rise time around 4:30 AM.  She does drink quite a bit of caffeine in form of soda, 312 ounce bottles per day on average.    Of note, she had a baseline polysomnogram through our sleep lab on 03/23/2022 which showed moderate obstructive sleep apnea, with a total AHI of 15.3/hour, REM AHI of 27/hour, and O2 nadir of 87%.  The absence of supine sleep may have resulted in an underestimation of her sleep disordered breathing.    Of note, she is no longer on baclofen, Valium, Emgality , Lamictal , Zyprexa, Lyrica, or Ubrelvy .  The patient's allergies, current medications, family history, past medical history, past social history, past surgical history and problem list were reviewed and updated as appropriate.   Previously:   07/15/22 Johnnie Russell, NP): << MEDORA Bennett is a 48 y.o. female with a history of OSA on CPAP and Migraines. Returns today for follow-up.  She reports that she tried using the CPAP  consistently for 30 days.  She states that she is unable to use it.  She feels that she sleeps better when she does not use the CPAP machine.  She reports that she recently had a breast biopsy and looking at the results on MyChart she knows that it is cancerous but she goes tomorrow to discuss plan of care.  At this time she does not want to restart CPAP therapy.  She will let us  know if she wants to discuss alternative treatment options such as a dental device.   In regards to her headaches she has been using Emgality  for prevention and Ubrelvy  for abortive therapy.  She states that the last Emgality  was not the autoinjector and it made her leg sore.  She states that her headache frequency has increased but she relates this to possible stress.  She has not had Emgality  in 6 weeks. >>  01/01/2022 (SA): 48 year old right-handed woman with an underlying medical history of migraine headaches, seizure-like events, reflux disease, vitamin D deficiency, anxiety, depression, and overweight state, who reports snoring and excessive daytime somnolence as well as morning headaches.  She does not wake up rested, sometimes she has nighttime headaches. I reviewed your office note from 12/02/2021.  Her Epworth sleepiness score is 15 out of 24, fatigue severity score is 45 out of 63. She lives with her family including husband and 24 year old daughter.  They have 2 dogs and 1 cat in the household, none of  the pets sleep in her bedroom.  She does have a TV on at night in her bedroom but generally they try to turn it off between 9 and 10.  She is in bed around 9 PM and rise time during the workweek is between 5 and 5:30 AM.  She works as a Runner, broadcasting/film/video.  She has some difficulty going to sleep and sometimes multiple nighttime awakenings.  She has nocturia at least twice per average night.  She drinks caffeine in the form of tea, up to 32 ounces altogether per day.  She does not currently drink any alcohol, she is a non-smoker.  She has  had weight gain over the past 2+ years in the realm of 25 pounds.   She is not aware of any family history of sleep apnea.  She is no longer on gabapentin as it made her sleepy during the day.  She takes about 1 nap per day on average.     Her Past Medical History Is Significant For: Past Medical History:  Diagnosis Date   Amenorrhea    Anxiety    Breast cancer (HCC)    Depression    Headache    Migraine    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   Seizure (HCC)    Seizures (HCC)     Her Past Surgical History Is Significant For: Past Surgical History:  Procedure Laterality Date   DILATION AND CURETTAGE OF UTERUS     hsyteroscopy     LAPAROSCOPY     TUBAL LIGATION      Her Family History Is Significant For: Family History  Problem Relation Age of Onset   Ovarian cancer Mother    Hypertension Father    Throat cancer Father    CAD Brother    Diabetes Brother    Sleep apnea Neg Hx     Her Social History Is Significant For: Social History   Socioeconomic History   Marital status: Married    Spouse name: Not on file   Number of children: Not on file   Years of education: Not on file   Highest education level: Not on file  Occupational History   Not on file  Tobacco Use   Smoking status: Never   Smokeless tobacco: Never  Vaping Use   Vaping status: Never Used  Substance and Sexual Activity   Alcohol use: No   Drug use: No   Sexual activity: Yes    Birth control/protection: None  Other Topics Concern   Not on file  Social History Narrative   Lives at home husband and kids   Right handed   Caffeine: tea multiple small cups per day    Social Drivers of Health   Financial Resource Strain: Low Risk  (06/08/2023)   Received from [facility]   Overall Financial Resource Strain (CARDIA)    Difficulty of Paying Living Expenses: Not hard at all  Food Insecurity: No Food Insecurity (06/08/2023)   Received from [facility]   Hunger Vital Sign    Within the past 12  months, you worried that your food would run out before you got the money to buy more.: Never true    Within the past 12 months, the food you bought just didn't last and you didn't have money to get more.: Never true  Transportation Needs: No Transportation Needs (06/08/2023)   Received from [facility] - Transportation    Lack of Transportation (Medical): No    Lack of Transportation (Non-Medical):  No  Physical Activity: Unknown (07/20/2022)   Received from [facility]   Exercise Vital Sign    On average, how many days per week do you engage in moderate to strenuous exercise (like a brisk walk)?: Patient declined    On average, how many minutes do you engage in exercise at this level?: 0 min  Stress: No Stress Concern Present (10/26/2023)   Received from [facility] - Occupational Stress Questionnaire    Feeling of Stress : Not at all  Social Connections: Socially Integrated (07/20/2022)   Received from [facility]   Social Network    How would you rate your social network (family, work, friends)?: Good participation with social networks    Her Allergies Are:  Allergies  Allergen Reactions   Plecanatide Diarrhea    Other reaction(s): diarrhea   Zonisamide  Other (See Comments)    Side effects of dizziness, blurred vision.  :   Her Current Medications Are:  Outpatient Encounter Medications as of 12/08/2023  Medication Sig   ARIPiprazole (ABILIFY) 10 MG tablet Take 10 mg by mouth every morning.   famotidine  (PEPCID ) 10 MG tablet Take 1 tablet (10 mg total) by mouth at bedtime. (Patient taking differently: Take 10 mg by mouth at bedtime. As needed)   FLUoxetine (PROZAC) 20 MG capsule Take 20 mg by mouth daily. (Patient taking differently: Take 60 mg by mouth daily.)   ibuprofen (ADVIL) 800 MG tablet Take 800 mg by mouth 3 (three) times daily. (Patient taking differently: Take 800 mg by mouth 3 (three) times daily. As needed)    pregabalin (LYRICA) 50 MG capsule Take 50 mg by mouth 3 (three) times daily. (Patient taking differently: Take 50 mg by mouth 3 (three) times daily. As needed)   VITAMIN D PO Take 1,000 Int'l Units by mouth.   zolpidem (AMBIEN) 10 MG tablet Take 5-10 mg by mouth at bedtime.   baclofen (LIORESAL) 10 MG tablet Take 10 mg by mouth 3 (three) times daily as needed for muscle spasms. (Patient not taking: Reported on 12/08/2023)   diazepam (VALIUM) 5 MG tablet Take 5 mg by mouth every 8 (eight) hours. (Patient not taking: Reported on 12/08/2023)   esomeprazole (NEXIUM) 40 MG capsule Take 40 mg by mouth daily. (Patient not taking: Reported on 12/08/2023)   estradiol  (ESTRACE ) 2 MG tablet Take 2 mg by mouth daily. (Patient not taking: Reported on 12/08/2023)   FLUoxetine (PROZAC) 40 MG capsule Take 40 mg by mouth daily. Along with 20mg  capsule for a total daily dose of 60mg . (Patient not taking: Reported on 12/08/2023)   Galcanezumab -gnlm (EMGALITY ) 120 MG/ML SOAJ Inject 120 mg into the skin every 30 (thirty) days. (Patient not taking: Reported on 12/08/2023)   lamoTRIgine  (LAMICTAL ) 100 MG tablet Take 1 tablet (100 mg total) by mouth 2 (two) times daily. (Patient not taking: Reported on 12/08/2023)   medroxyPROGESTERone  (PROVERA ) 5 MG tablet Take 5 mg by mouth daily. (Patient not taking: Reported on 12/08/2023)   oxyCODONE  (ROXICODONE ) 5 MG immediate release tablet Take 1 tablet (5 mg total) by mouth every 4 (four) hours as needed for severe pain (pain score 7-10). (Patient not taking: Reported on 12/08/2023)   silver sulfADIAZINE (SILVADENE) 1 % cream Apply 1 Application topically daily. (Patient not taking: Reported on 12/08/2023)   Ubrogepant  (UBRELVY ) 100 MG TABS Take 1 tablet at the onset of migraine. Repeat in 2 hours if needed. Only 2 tabs in 24 hours. (Patient not taking:  Reported on 12/08/2023)   Vitamin D, Ergocalciferol, (DRISDOL) 1.25 MG (50000 UNIT) CAPS capsule Take 1 capsule by mouth once a week. Take on  Saturdays (Patient not taking: Reported on 12/08/2023)   [DISCONTINUED] OLANZapine (ZYPREXA) 5 MG tablet Take 5 mg by mouth at bedtime.   No facility-administered encounter medications on file as of 12/08/2023.  :  Review of Systems:  Out of a complete 14 point review of systems, all are reviewed and negative with the exception of these symptoms as listed below:  Review of Systems  Neurological:        Patient is here alone for follow-up. She reports extreme sleepiness in the mornings that started a few months ago. After she gets ready in the morning she wants to go back to sleep. Patient reports her cpap mask keeps coming off at night. She feels her mask is big and her primary care has recommended the nasal mask.  She denies any other problems. ESS 14.    Objective:  Neurological Exam  Physical Exam Physical Examination:   Vitals:   12/08/23 0746  BP: 106/70  Pulse: 71    General Examination: The patient is a very pleasant 48 y.o. female in no acute distress. She appears well-developed and well-nourished and well groomed.   HEENT: Normocephalic, atraumatic, pupils are equal, round and reactive to light, corrective eyeglasses in place.  Extraocular tracking is good without limitation to gaze excursion or nystagmus noted. Hearing is grossly intact. Face is symmetric with normal facial animation. Speech is clear with no dysarthria noted. There is no hypophonia. There is no lip, neck/head, jaw or voice tremor. Neck shows FROM. There are no carotid bruits on auscultation. Oropharynx exam reveals: mild to moderate mouth dryness, adequate dental hygiene and mild airway crowding. Tongue protrudes centrally and palate elevates symmetrically, smaller tonsils noted.    Chest: Clear to auscultation without wheezing, rhonchi or crackles noted.   Heart: S1+S2+0, regular and normal without murmurs, rubs or gallops noted.    Abdomen: Soft, non-tender and non-distended.   Extremities: There is no  pitting edema in the distal lower extremities bilaterally.    Skin: Warm and dry without trophic changes noted.    Musculoskeletal: exam reveals no obvious joint deformities.    Neurologically:  Mental status: The patient is awake, alert and oriented in all 4 spheres. Her immediate and remote memory, attention, language skills and fund of knowledge are appropriate. There is no evidence of aphasia, agnosia, apraxia or anomia. Speech is clear with normal prosody and enunciation. Thought process is linear. Mood is normal and affect is normal.  Cranial nerves II - XII are as described above under HEENT exam.  Motor exam: Normal bulk, moving all 4 extremities without restriction, no obvious tremor. Fine motor skills and coordination: grossly intact.  Cerebellar testing: No dysmetria or intention tremor. There is no truncal or gait ataxia.  Sensory exam: intact to light touch in the upper and lower extremities.  Gait, station and balance: She stands easily. No veering to one side is noted. No leaning to one side is noted. Posture is age-appropriate and stance is narrow based. Gait shows normal stride length and normal pace. No problems turning are noted.    Assessment and Plan:  In summary, WAI MINOTTI is a 48 year old right-handed woman with an underlying medical history of breast cancer, status post bilateral mastectomy, migraine headaches, seizure-like events, reflux disease, vitamin D deficiency, anxiety, depression, and overweight state, who presents for follow-up consultation of  her obstructive sleep apnea.  She was followed in our general neurology clinic by Dr. Onita and Duwaine Russell, NP for her migraines, but reports that she had improvement in her migraines and no longer takes Emgality  or Ubrelvy .  She has a history of moderate obstructive sleep apnea but has not used her AutoPap consistently.  She is encouraged to get back on treatment and I will write for a mask refit through her DME  provider.  Her exhaustion and daytime somnolence are likely from a combination of issues including medication effect and untreated sleep apnea.  I explained this to her.  She is advised to follow-up routinely with Megan Millikan, NP in about 6 to 8 months, sooner if needed.  She is encouraged to stay well-hydrated with water and limit her caffeine.  She is encouraged to get in touch with her DME company to set up an appointment for mask refit.  I answered all her questions today and she was in agreement. I spent 35 minutes in total face-to-face time and in reviewing records during pre-charting, more than 50% of which was spent in counseling and coordination of care, reviewing test results, reviewing medications and treatment regimen and/or in discussing or reviewing the diagnosis of OSA, the prognosis and treatment options. Pertinent laboratory and imaging test results that were available during this visit with the patient were reviewed by me and considered in my medical decision making (see chart for details).

## 2023-12-14 NOTE — Progress Notes (Signed)
 RE: mask refit, mask of choice size to firt may add chinstrap if needed Received: 3 days ago New, Adine Neysa Nena GORMAN, RN; Joylene Carlean Sheree Leveda Jackson Avelina; Tucker, Dolanda; 1 other Received, Thank you!       Previous Messages    ----- Message ----- From: Neysa Nena GORMAN, RN Sent: 12/10/2023   9:04 AM EDT To: Adine Joylene; Avelina Jackson; Ephraim Dollar* Subject: mask refit, mask of choice size to firt may *  New order in Epic  Julie Bennett Female, 48 y.o., January 01, 1976 MRN: [id number]   Thank you,  [name]

## 2024-08-22 ENCOUNTER — Ambulatory Visit: Admitting: Adult Health
